# Patient Record
Sex: Male | Born: 1975 | Race: Black or African American | Hispanic: No | Smoking: Never smoker
Health system: Southern US, Community
[De-identification: ages and names within clinical notes are randomized; demographics above are authoritative.]

## PROBLEM LIST (undated history)

## (undated) DIAGNOSIS — F419 Anxiety disorder, unspecified: Secondary | ICD-10-CM

## (undated) DIAGNOSIS — F32A Depression, unspecified: Secondary | ICD-10-CM

## (undated) DIAGNOSIS — B20 Human immunodeficiency virus [HIV] disease: Secondary | ICD-10-CM

## (undated) DIAGNOSIS — R51 Headache: Secondary | ICD-10-CM

## (undated) DIAGNOSIS — F329 Major depressive disorder, single episode, unspecified: Secondary | ICD-10-CM

## (undated) DIAGNOSIS — K621 Rectal polyp: Secondary | ICD-10-CM

## (undated) DIAGNOSIS — R519 Headache, unspecified: Secondary | ICD-10-CM

## (undated) DIAGNOSIS — G4700 Insomnia, unspecified: Secondary | ICD-10-CM

## (undated) DIAGNOSIS — Z21 Asymptomatic human immunodeficiency virus [HIV] infection status: Secondary | ICD-10-CM

## (undated) DIAGNOSIS — K6289 Other specified diseases of anus and rectum: Secondary | ICD-10-CM

## (undated) DIAGNOSIS — Z23 Encounter for immunization: Secondary | ICD-10-CM

## (undated) DIAGNOSIS — A630 Anogenital (venereal) warts: Secondary | ICD-10-CM

## (undated) DIAGNOSIS — Z113 Encounter for screening for infections with a predominantly sexual mode of transmission: Secondary | ICD-10-CM

## (undated) HISTORY — DX: Major depressive disorder, single episode, unspecified: F32.9

## (undated) HISTORY — DX: Encounter for screening for infections with a predominantly sexual mode of transmission: Z11.3

## (undated) HISTORY — DX: Anxiety disorder, unspecified: F41.9

## (undated) HISTORY — DX: Human immunodeficiency virus (HIV) disease: B20

## (undated) HISTORY — DX: Depression, unspecified: F32.A

## (undated) HISTORY — DX: Headache: R51

## (undated) HISTORY — DX: Other specified diseases of anus and rectum: K62.89

## (undated) HISTORY — DX: Anogenital (venereal) warts: A63.0

## (undated) HISTORY — DX: Headache, unspecified: R51.9

## (undated) HISTORY — DX: Insomnia, unspecified: G47.00

## (undated) HISTORY — DX: Asymptomatic human immunodeficiency virus (hiv) infection status: Z21

## (undated) HISTORY — DX: Rectal polyp: K62.1

## (undated) HISTORY — DX: Encounter for immunization: Z23

---

## 1997-06-01 DIAGNOSIS — K621 Rectal polyp: Secondary | ICD-10-CM

## 1997-06-01 HISTORY — DX: Rectal polyp: K62.1

## 2006-02-23 ENCOUNTER — Emergency Department (HOSPITAL_COMMUNITY): Admission: EM | Admit: 2006-02-23 | Discharge: 2006-02-23 | Payer: Self-pay | Admitting: Emergency Medicine

## 2008-06-01 HISTORY — PX: DENTAL SURGERY: SHX609

## 2011-01-15 ENCOUNTER — Telehealth: Payer: Self-pay | Admitting: *Deleted

## 2011-01-15 NOTE — Telephone Encounter (Signed)
He called to verify his new intake appt with Tomasita Morrow, RN. He thought it was this coming Monday or Tuesday. Told him he was not on schedule. Found out from other staff that Tammy was leaving messages for people to make appts but when he did not call her back before she left for vacation, no appt was made. States he is not happy as he has already asked for that day off. Told him I will have Ms. Brooke Dare call him when she returns. [phone]

## 2011-01-29 ENCOUNTER — Ambulatory Visit (INDEPENDENT_AMBULATORY_CARE_PROVIDER_SITE_OTHER): Payer: No Typology Code available for payment source

## 2011-01-29 DIAGNOSIS — Z79899 Other long term (current) drug therapy: Secondary | ICD-10-CM

## 2011-01-29 DIAGNOSIS — Z113 Encounter for screening for infections with a predominantly sexual mode of transmission: Secondary | ICD-10-CM

## 2011-01-29 DIAGNOSIS — B20 Human immunodeficiency virus [HIV] disease: Secondary | ICD-10-CM

## 2011-01-29 LAB — LIPID PANEL
HDL: 54 mg/dL (ref >39)
LDL Cholesterol: 99 mg/dL (ref 0–99)
Triglycerides: 77 mg/dL (ref <150)
VLDL: 15 mg/dL (ref 0–40)

## 2011-01-29 LAB — CBC WITH DIFFERENTIAL/PLATELET
Basophils Relative: 0 % (ref 0–1)
HCT: 45.4 % (ref 39.0–52.0)
Hemoglobin: 16.5 g/dL (ref 13.0–17.0)
Lymphocytes Relative: 49 % — ABNORMAL HIGH (ref 12–46)
MCHC: 36.3 g/dL — ABNORMAL HIGH (ref 30.0–36.0)
Monocytes Absolute: 0.5 10*3/uL (ref 0.1–1.0)
Monocytes Relative: 6 % (ref 3–12)
Neutro Abs: 3.2 10*3/uL (ref 1.7–7.7)
Neutrophils Relative %: 44 % (ref 43–77)
RBC: 5.75 MIL/uL (ref 4.22–5.81)
WBC: 7.4 10*3/uL (ref 4.0–10.5)

## 2011-01-29 NOTE — Progress Notes (Signed)
Pt presents today for initial HIV intake session with Tomasita Morrow, RN IV.  He  became upset and anxious  when he realized he was not seeing the ID physician today.  This is a nurse only visit.  His next visit with the physician will be in two week after the specialty test return.  He has multiple complaints that will not be addressed today.  Pt's major concern  is not his diagnosis of HIV but c/o rectal bleeding for several months accompanied by abdominal pain. He states the rectal bleeding occurs at random and is not associated with rectal intercourse.  Amounts of blood vary from dime size to large quantities and sometimes clots.  Color changes from bright red to dark red at different times throughout the day or week.  He is very anxious and thinks he has colon cancer after submitting information on the internet. He did not seek medical care since he was scheduled for today's visit.   During the intake session he is not able to focus on questions and admits he  thinks I am purposely  ignoring his complaints about his rectal bleeding and only wanted to discuss the HIV. Prior to the start of this session I discussed these symptoms for 15 minutes or more.  I explained to patient he should go to Urgent Care today  for evaluation and he refused. He stated they never take anyone serious and he could be dead before he gets an appointment.  His anxiousness increased. Pt was then encouraged to seek a primary care physician who can address his current symptoms and he would not have to wait  2 weeks for ID appointment. He was also advised he would need a primary care in addition to ID physician.  To assist the patient and put him at ease, I will make the call to schedule a primary care appointment. I called [facility] for appointment.  He is scheduled to see Dr. Illene Regulus on 01-30-11.  New HIV labs done today. Some labs may be complete prior to primary care appointment.   Additional information regarding  HIV available in [facility] intake form.  The only records received from [facility] were  CD 4 , HIV antibody and Western Blot.  Pt. is to return for initial  ID visit with Dr Daiva Eves on 02-09-11.  Tomasita Morrow, RN IV  Infectious Disease Intake Nurse [phone] phone

## 2011-01-30 ENCOUNTER — Ambulatory Visit (INDEPENDENT_AMBULATORY_CARE_PROVIDER_SITE_OTHER): Payer: Self-pay | Admitting: Internal Medicine

## 2011-01-30 ENCOUNTER — Encounter: Payer: Self-pay | Admitting: Internal Medicine

## 2011-01-30 DIAGNOSIS — B20 Human immunodeficiency virus [HIV] disease: Secondary | ICD-10-CM

## 2011-01-30 DIAGNOSIS — K621 Rectal polyp: Secondary | ICD-10-CM

## 2011-01-30 DIAGNOSIS — J370 Chronic laryngitis: Secondary | ICD-10-CM

## 2011-01-30 DIAGNOSIS — Z21 Asymptomatic human immunodeficiency virus [HIV] infection status: Secondary | ICD-10-CM

## 2011-01-30 DIAGNOSIS — K620 Anal polyp: Secondary | ICD-10-CM

## 2011-01-30 DIAGNOSIS — F329 Major depressive disorder, single episode, unspecified: Secondary | ICD-10-CM

## 2011-01-30 LAB — COMPLETE METABOLIC PANEL WITH GFR
ALT: 24 U/L (ref 0–53)
Albumin: 4.6 g/dL (ref 3.5–5.2)
Alkaline Phosphatase: 77 U/L (ref 39–117)
CO2: 28 mEq/L (ref 19–32)
GFR, Est African American: >60 mL/min (ref >60)
Glucose, Bld: 89 mg/dL (ref 70–99)
Potassium: 4.1 mEq/L (ref 3.5–5.3)
Sodium: 141 mEq/L (ref 135–145)
Total Bilirubin: 0.7 mg/dL (ref 0.3–1.2)
Total Protein: 7.3 g/dL (ref 6.0–8.3)

## 2011-01-30 LAB — HEPATITIS C ANTIBODY: HCV Ab: NEGATIVE

## 2011-01-30 LAB — URINALYSIS, ROUTINE W REFLEX MICROSCOPIC
Hgb urine dipstick: NEGATIVE
Ketones, ur: NEGATIVE mg/dL
Nitrite: NEGATIVE
pH: 7 (ref 5.0–8.0)

## 2011-01-30 LAB — HEPATITIS B SURFACE ANTIGEN: Hepatitis B Surface Ag: NEGATIVE

## 2011-01-30 LAB — HEPATITIS A ANTIBODY, IGM: Hep A IgM: NEGATIVE

## 2011-01-30 LAB — RPR

## 2011-01-30 MED ORDER — CLOTRIMAZOLE-BETAMETHASONE 1-0.05 % EX CREA: TOPICAL_CREAM | CUTANEOUS | Status: DC

## 2011-01-30 NOTE — Progress Notes (Signed)
Subjective:    Patient ID: Kenneth Mason, male    DOB: Mar 04, 1976, 35 y.o.   MRN: [id number]  HPI Kenneth Mason presents to establish for continuity care.  He reports that he has had hematochezia and has found blood in the stool. He also is having increased lymph nodes in the submandibular region. He has also developed a skin rash on the abdomen. He has been diagnosed with HIV but has a stable CD4 count. He has been seen by the intake nurse at the [facility] ID clinic (who referred him for primary care) and is scheduled to see Dr. Daiva Mason in September. 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 He would like to have ENT evaluation.  Past Medical History  Diagnosis Date  . Depression     no hospitalization, has seen psychiatrist, not taking medication  . Headache in front of head     q 2 weeks. No n/v, no photophobia. Responds to NSAIDs  . HIV infection     dx'd HIV July 12; CD4 1100  . Rectal polyp 1999    polyp excised   Past Surgical History  Procedure Date  . Wisdom teeth extracted 2010   Family History  Problem Relation Age of Onset  . Hypertension Mother   . Cancer Maternal Grandfather   . Cancer Paternal Grandmother     liver  . Stroke Paternal Grandmother   . Heart disease Paternal Grandfather     CAD/MI-fatal   History   Social History  . Marital Status: Married    Spouse Name: N/A    Number of Children: 2  . Years of Education: 14   Occupational History  . customer service    Social History Main Topics  . Smoking status: Never Smoker   . Smokeless tobacco: Never Used  . Alcohol Use: Not on file     rarely - once a month   . Drug Use: No  . Sexually Active: Yes -- Male, Male partner(s)   Other Topics Concern  . Not on file   Social History Narrative   HSG, 2 yrs college. Married '04 - seperated (2012). !son ' 2005; 1  dtr - '08. 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  Lives with children. Wife/ mother is abroad (aug '12).       Review of Systems Review of Systems v Constitutional:  Negative for fever, chills, activity change and unexpected weight change.  HEENT:  Negative for hearing loss, ear pain, congestion, neck stiffness and postnasal drip. Negative for sore throat or swallowing problems. Negative for dental complaints.   Eyes: Negative for vision loss or change in visual acuity.  Respiratory: Negative for chest tightness and wheezing.   Cardiovascular: Negative for chest pain and palpitation. No decreased exercise tolerance Gastrointestinal: No change in bowel habit. Positive for bloating andgas. NO reflux or indigestion. Positive for change in stool caliber and also with blood in the stool.  Genitourinary: Negative for urgency, frequency, flank pain and difficulty urinating.  Musculoskeletal: Negative for myalgias, back pain, arthralgias and gait problem.  Neurological: Negative for dizziness, tremors, weakness and headaches. Legs will "fall asleep" frequently but this is treansient.   Hematological: Negative for adenopathy.  Psychiatric/Behavioral: Negative for behavioral problems and dysphoric mood.       Objective:   Physical Exam Vitals reviewed and normal HEENT - [place]/AT, C&S clear, PERRLA,  EOMI Neck - supple Node - on bimanual palpation of the floor of the mouth there is a .5 cm firm, non-tender nodule/node. No anterior or posterior adenopathy, no supraclavicular adenopathy. Chest - good breath sounds w/o rales, wheeze Cor - 2+ radial pulse, RRR w/o murmur, rub or gallop. Abd- BS+ , no guarding, no HSM Ext - no deformity Neuro - A&O x 3, CXN II- XII grossly intact, nl gait. Derm- macular tan rash on abdomen.       Assessment & Plan:  Minor area on arms that appear similar to tinea corporis  Plan - lotrisone applied to the isolated well circumscribed lesions bid.

## 2011-02-02 DIAGNOSIS — B20 Human immunodeficiency virus [HIV] disease: Secondary | ICD-10-CM | POA: Insufficient documentation

## 2011-02-02 DIAGNOSIS — K6289 Other specified diseases of anus and rectum: Secondary | ICD-10-CM | POA: Insufficient documentation

## 2011-02-02 DIAGNOSIS — J370 Chronic laryngitis: Secondary | ICD-10-CM | POA: Insufficient documentation

## 2011-02-02 DIAGNOSIS — F329 Major depressive disorder, single episode, unspecified: Secondary | ICD-10-CM | POA: Insufficient documentation

## 2011-02-02 NOTE — Assessment & Plan Note (Signed)
New diagnosis. He seems to have an understanding of this infection. He does not appear to be overly anxious but his history of depression is kept in mind. He is aware and does practice safe sex.  Plan - co-management with the ID team at the [facility] ID clinic

## 2011-02-02 NOTE — Assessment & Plan Note (Signed)
Normal phonation today. His concern is for any structual issues, i.e. Nodules on the vocal chords.  Plan - will refer to ENT after other problems are more settled.

## 2011-02-02 NOTE — Assessment & Plan Note (Signed)
-   Appears stable at this time ?

## 2011-02-02 NOTE — Assessment & Plan Note (Signed)
Patient with a h/o colon polyp now with blood in the stool, not on the stool and no report of hemorrhoidal type symptoms.  Plan - refer to GI for consultation and probable colonoscopy.

## 2011-02-04 ENCOUNTER — Encounter: Payer: Self-pay | Admitting: Internal Medicine

## 2011-02-09 ENCOUNTER — Ambulatory Visit (INDEPENDENT_AMBULATORY_CARE_PROVIDER_SITE_OTHER): Payer: 59 | Admitting: Infectious Disease

## 2011-02-09 ENCOUNTER — Encounter: Payer: Self-pay | Admitting: Infectious Disease

## 2011-02-09 DIAGNOSIS — F329 Major depressive disorder, single episode, unspecified: Secondary | ICD-10-CM

## 2011-02-09 DIAGNOSIS — B20 Human immunodeficiency virus [HIV] disease: Secondary | ICD-10-CM

## 2011-02-09 DIAGNOSIS — J370 Chronic laryngitis: Secondary | ICD-10-CM

## 2011-02-09 DIAGNOSIS — K625 Hemorrhage of anus and rectum: Secondary | ICD-10-CM | POA: Insufficient documentation

## 2011-02-09 DIAGNOSIS — Z21 Asymptomatic human immunodeficiency virus [HIV] infection status: Secondary | ICD-10-CM

## 2011-02-09 NOTE — Assessment & Plan Note (Signed)
In many ways would be ideal candidate for START study with low viral load, high cd4 count would be unlikely to rapidly progress if in the deferred earn and he has doubts about starting ARV, while it would be completely appropriate to start ARV. I think he would be highly compliant if he is ready to start and engaged. He would likely do well on  Single tablet regimen such as atripla, complera or stribild. His problems with depression might make me cautious with atripla though

## 2011-02-09 NOTE — Progress Notes (Signed)
Subjective:    Patient ID: Kenneth Mason, male    DOB: 13-Oct-1975, 35 y.o.   MRN: [id number]  HPI  35 year old [place] American male with newly diagnosed HIV.  We spent well over an hour with the patient including face to face counseling of the patient.  With regards to his HIV he tested positive for HIV via a home kit after onset of cervical lymphadenopathy, fevers, chills malaise, sinus congestion, ear fullness and malaise. HIs CD4 count is greater than 1000 and his viral load on testing is 4k, genotype pending. I reviewed with him current DHHS guidelines which recommend that ALL patients with HIV be treated regardless of CDD4 count (if they have  Detectable viral load) but went through the levels of evidence for need for treatment at each cd4 cuttoff, <200, <350, >350, >500. I explained all first line DHHS regimens to him as well as new Single tablet regimens of complera and stribild. I outlined several of our clinical trials in including the NIH  START trial which specifically addressees starting ARV in pts with CD4>500 as well as our ACTG trials. He met with Deirdre Evener our research RN as well. Currently he is not yet ready to start ARV though he is interetsed in our ACTG and START and he would be ideal candidate for the latter. I reviewed all strengths and weakensss of each ARV regimen I would pick for him   He has several other issues as well.  First he has problems with depression and fleeting problems of passive suicidal ideation. He denies active SI, or HI and is contracted for safety.   He has had blood per rectum but with stable hemoglobin and is being worked up by Dr Debby Bud from [facility] who is considering referall for possible colonoscopy.  Pt is without dizziness, light headendness.  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  HENT: Positive for congestion, voice change and sinus pressure. Negative for sore throat, rhinorrhea, sneezing and trouble swallowing.   Eyes: Negative for photophobia and visual disturbance.  Respiratory: Negative for cough, chest tightness, shortness of breath, wheezing and stridor.   Cardiovascular: Negative for chest pain, palpitations and leg swelling.  Gastrointestinal: Positive for constipation, blood in stool, abdominal distention and anal bleeding. Negative for nausea, vomiting, abdominal pain and diarrhea.  Genitourinary: Negative for dysuria, hematuria, flank pain and difficulty urinating.  Musculoskeletal: Negative for myalgias, back pain, joint swelling, arthralgias and gait problem.  Skin: Negative for color change, pallor, rash and wound.  Neurological: Negative for dizziness, tremors, weakness and light-headedness.  Hematological: Negative for adenopathy. Does not bruise/bleed easily.  Psychiatric/Behavioral: Positive for suicidal ideas, sleep disturbance and dysphoric mood. Negative for behavioral problems, confusion, decreased concentration and agitation. The patient is nervous/anxious.        Objective:   Physical Exam  Constitutional: He is oriented to person, place, and time. He appears well-developed and well-nourished. No distress.  HENT:  Head: Normocephalic and atraumatic.  Mouth/Throat: Oropharynx is clear and moist. No oropharyngeal exudate.  Eyes: Conjunctivae and EOM are normal. Pupils are equal, round, and reactive to light. No scleral icterus.  Neck: Normal range of motion. Neck supple. No JVD present.  Cardiovascular: Normal rate, regular rhythm and normal heart sounds.  Exam reveals no gallop  and no friction rub.   No murmur heard. Pulmonary/Chest: Effort normal and breath sounds normal. No respiratory distress. He has no wheezes. He has no rales. He exhibits no tenderness.  Abdominal:  He exhibits no distension and no mass. There is no tenderness. There is no rebound and no guarding.  Musculoskeletal: He exhibits no edema and no tenderness.  Lymphadenopathy:    He has no cervical adenopathy.  Neurological: He is alert and oriented to person, place, and time. He has normal reflexes. He exhibits normal muscle tone. Coordination normal.  Skin: Skin is warm and dry. He is not diaphoretic. No erythema. No pallor.  Psychiatric: Judgment and thought content normal. His mood appears anxious. His affect is not blunt, not labile and not inappropriate. His speech is not rapid and/or pressured, not delayed, not tangential and not slurred. He is agitated. He is not aggressive, is not hyperactive, not slowed, not withdrawn, not actively hallucinating and not combative. Thought content is not paranoid and not delusional. Cognition and memory are not impaired. He does not express impulsivity or inappropriate judgment. He exhibits a depressed mood. He expresses no homicidal and no suicidal ideation. He expresses no suicidal plans and no homicidal plans. He is communicative. He exhibits normal recent memory and normal remote memory. He is attentive.          Assessment & Plan:  HIV infection In many ways would be ideal candidate for START study with low viral load, high cd4 count would be unlikely to rapidly progress if in the deferred earn and he has doubts about starting ARV, while it would be completely appropriate to start ARV. I think he would be highly compliant if he is ready to start and engaged. He would likely do well on  Single tablet regimen such as atripla, complera or stribild. His problems with depression might make me cautious with atripla though  Laryngitis, chronic Nothing obvious on exam. Could this partly due to GERD? Could trial a PPI  Depression He endorses passive SI at times but never active SI, he is contracted for safety. Would likely benefit from SSRI  Blood per  rectum Likely due to internal hemorrhoids, but agree with working this up

## 2011-02-09 NOTE — Assessment & Plan Note (Signed)
Likely due to internal hemorrhoids, but agree with working this up

## 2011-02-09 NOTE — Assessment & Plan Note (Signed)
He endorses passive SI at times but never active SI, he is contracted for safety. Would likely benefit from SSRI

## 2011-02-09 NOTE — Patient Instructions (Signed)
Research the options we discussed and talk to Kenneth Mason Please make a followup appt in one months time

## 2011-02-09 NOTE — Assessment & Plan Note (Signed)
Nothing obvious on exam. Could this partly due to GERD? Could trial a PPI

## 2011-02-19 LAB — HIV-1 GENOTYPR PLUS

## 2011-02-23 ENCOUNTER — Ambulatory Visit: Payer: 59 | Admitting: Infectious Disease

## 2011-02-26 ENCOUNTER — Other Ambulatory Visit: Payer: Self-pay | Admitting: Internal Medicine

## 2011-02-26 ENCOUNTER — Other Ambulatory Visit: Payer: 59

## 2011-02-26 ENCOUNTER — Ambulatory Visit: Payer: Self-pay | Admitting: Internal Medicine

## 2011-02-26 ENCOUNTER — Ambulatory Visit: Payer: 59 | Admitting: Infectious Disease

## 2011-02-26 DIAGNOSIS — Z0000 Encounter for general adult medical examination without abnormal findings: Secondary | ICD-10-CM

## 2011-02-26 LAB — HEMOCCULT SLIDES (X 3 CARDS)
OCCULT 1: NEGATIVE
OCCULT 2: NEGATIVE
OCCULT 5: NEGATIVE

## 2011-02-27 ENCOUNTER — Telehealth: Payer: Self-pay | Admitting: Internal Medicine

## 2011-02-27 NOTE — Telephone Encounter (Signed)
lmom for pt to call back. R/S pt for 03/02/11 at 4pm.

## 2011-02-27 NOTE — Telephone Encounter (Signed)
Please call pt- hemocult cards negative

## 2011-02-27 NOTE — Telephone Encounter (Signed)
I could not hear pt d/t the connection and reception of his cell. We exchanged calls with VM and I left a message confirming his appt with Dr Rhea Belton on Monday, March 02, 2011 at 4pm. I asked him to call for questions or if he can't make the appt.

## 2011-03-02 ENCOUNTER — Encounter: Payer: Self-pay | Admitting: Internal Medicine

## 2011-03-02 ENCOUNTER — Ambulatory Visit (INDEPENDENT_AMBULATORY_CARE_PROVIDER_SITE_OTHER): Payer: 59 | Admitting: Internal Medicine

## 2011-03-02 DIAGNOSIS — K625 Hemorrhage of anus and rectum: Secondary | ICD-10-CM

## 2011-03-02 DIAGNOSIS — K6289 Other specified diseases of anus and rectum: Secondary | ICD-10-CM

## 2011-03-02 MED ORDER — PEG-KCL-NACL-NASULF-NA ASC-C 100 G PO SOLR: 1.0000 | ORAL | Status: DC

## 2011-03-02 NOTE — Patient Instructions (Signed)
You have been scheduled for a Colonoscopy, instructions have been provided. Your prep has been sent to your pharmacy. 

## 2011-03-02 NOTE — Telephone Encounter (Signed)
Informed pt .

## 2011-03-02 NOTE — Progress Notes (Signed)
Subjective:    Patient ID: Kenneth Mason, male    DOB: 1975-08-27, 35 y.o.   MRN: [id number]  HPI Kenneth Mason is a 35 year old male with a past medical history of HIV with normal CD4 count not currently on ARV therapy, depression, and headaches who is seen in consultation at the request of Dr. Roderic Ovens for evaluation of rectal bleeding.  The patient reports that he has a history of rectal bleeding over the last 3-4 months however over the last 1-2 months he sees blood in "almost every stool". He describes the blood as bright red and occasionally dark red. He has associated abdominal bloating but he denies abdominal pain. He reports caliber change in his stool and notes that over the last year his stools have been thinner. He does occasionally report having to manually disimpact stool using his finger. He is able, however to have spontaneous BM on most days.  At present he denies upper symptoms including no nausea, vomiting, heartburn, dysphagia or odynophagia. His appetite is good and his weight is stable. He denies tenesmus, fecal urgency and fecal seepage.    He denies fevers or chills. He does have headaches which been ongoing since his HIV diagnosis. He notes some mild hoarseness to his voice, which he thinks may relate to his singing. He also reports overall fatigue.  The patient's history suggest prior rectal polyp, however he reports that he had a perianal skin lesion removed around 1999 in [place]. He remembers this being painful but does not recall the pathology results.  Review of Systems ROS Constitutional: See HPI HEENT: Negative for sore throat, mouth sores and trouble swallowing. Eyes: Negative for visual disturbance Respiratory: Negative for cough, chest tightness and shortness of breath Cardiovascular: Negative for chest pain, palpitations and lower extremity swelling Gastrointestinal: See history of present illness Genitourinary: Negative for dysuria and  hematuria. Musculoskeletal: Negative for back pain, arthralgias and myalgias Skin: Negative for rash or color change Neurological: Positive for headaches, negative for weakness, numbness Hematological: Positive for adenopathy, negative for easy bruising/bleeding Psychiatric/behavioral: Positive for depressed mood and trouble sleeping, negative for anxiety   Past Medical History  Diagnosis Date  . Depression     no hospitalization, has seen psychiatrist, not taking medication  . Headache in front of head     q 2 weeks. No n/v, no photophobia. Responds to NSAIDs  . HIV infection     dx'd HIV July 12; CD4 1100  . Rectal polyp 1999    polyp excised   Meds: None  No Known Allergies  Family History  Problem Relation Age of Onset  . Hypertension Mother   . Cancer Maternal Grandfather     unsure of type   . Liver cancer Paternal Grandmother   . Stroke Paternal Grandmother   . Heart disease Paternal Grandfather     CAD/MI-fatal  . Colon cancer Neg Hx     Social History  . Marital Status: Married    Number of Children: 2  . Years of Education: 14   Occupational History  . Customer Service     Social History Main Topics  . Smoking status: Never Smoker   . Smokeless tobacco: Never Used  . Alcohol Use: No     rarely - once a month   . Drug Use: No  . Sexually Active: Yes -- Male, Male partner(s)   Social History Narrative   HSG, 2 yrs college. Married '04 - seperated (2012). !son ' 2005; 1  dtr - '  08. 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  Lives with children. Wife/ mother is abroad (aug '12).       Objective:   Physical Exam BP 124/72  Pulse 88  Ht 5\' 10"  (1.778 m)  Wt 161 lb (73.029 kg)  BMI 23.10 kg/m2 Constitutional: Well-developed and well-nourished. No distress. HEENT: Normocephalic and atraumatic. Oropharynx is clear and moist. No oropharyngeal exudate. Conjunctivae are  normal. Pupils are equal round and reactive to light. No scleral icterus. Neck: Neck supple. Trachea midline. Cardiovascular: Normal rate, regular rhythm and intact distal pulses. No M/R/G Pulmonary/chest: Effort normal and breath sounds normal. No wheezing, rales or rhonchi. Abdominal: Soft, nontender, nondistended. Bowel sounds active throughout. There are no masses palpable. No hepatosplenomegaly. Lymphadenopathy: No cervical adenopathy noted, small shotty submental node noted. Neurological: Alert and oriented to person place and time. Skin: Skin is warm and dry. No rashes noted. Psychiatric: Normal mood and affect. Behavior is normal.  CBC    Component Value Date/Time   WBC 7.4 01/29/2011 1631   RBC 5.75 01/29/2011 1631   HGB 16.5 01/29/2011 1631   HCT 45.4 01/29/2011 1631   PLT 210 01/29/2011 1631   MCV 79.0 01/29/2011 1631   MCH 28.7 01/29/2011 1631   MCHC 36.3* 01/29/2011 1631   RDW 14.7 01/29/2011 1631   LYMPHSABS 3.7 01/29/2011 1631   MONOABS 0.5 01/29/2011 1631   EOSABS 0.1 01/29/2011 1631   BASOSABS 0.0 01/29/2011 1631    CMP     Component Value Date/Time   NA 141 01/29/2011 1631   K 4.1 01/29/2011 1631   CL 102 01/29/2011 1631   CO2 28 01/29/2011 1631   GLUCOSE 89 01/29/2011 1631   BUN 10 01/29/2011 1631   CREATININE 1.17 01/29/2011 1631   CALCIUM 9.8 01/29/2011 1631   PROT 7.3 01/29/2011 1631   ALBUMIN 4.6 01/29/2011 1631   AST 27 01/29/2011 1631   ALT 24 01/29/2011 1631   ALKPHOS 77 01/29/2011 1631   BILITOT 0.7 01/29/2011 1631   FOBT neg x 5     Assessment & Plan:  35 year old male with a past medical history of HIV with normal CD4 count not currently on ARV therapy, depression, and headaches who is seen in consultation at the request of Dr. Roderic Ovens for evaluation of rectal bleeding  1. Rectal bleeding -- the patient reports ongoing trouble with rectal bleeding and associated change in stool caliber.  A recent CBC did not reveal anemia and interestingly enough his FOBT stool  testing was negative.  Given his report of rectal bleeding and change in stool caliber we will proceed with colonoscopy for further evaluation. Further recommendations can be made after this test. We discussed the risk and benefits of colonoscopy today and he is willing to proceed.  2. HIV -- CD4 count most recently normal the patient is not on antiretroviral therapy at present.  He has followup with ID tomorrow to discuss his HIV.

## 2011-03-03 ENCOUNTER — Encounter: Payer: Self-pay | Admitting: Infectious Disease

## 2011-03-03 ENCOUNTER — Ambulatory Visit (INDEPENDENT_AMBULATORY_CARE_PROVIDER_SITE_OTHER): Payer: 59 | Admitting: Infectious Disease

## 2011-03-03 DIAGNOSIS — F329 Major depressive disorder, single episode, unspecified: Secondary | ICD-10-CM

## 2011-03-03 DIAGNOSIS — B20 Human immunodeficiency virus [HIV] disease: Secondary | ICD-10-CM

## 2011-03-03 DIAGNOSIS — K625 Hemorrhage of anus and rectum: Secondary | ICD-10-CM

## 2011-03-03 DIAGNOSIS — Z21 Asymptomatic human immunodeficiency virus [HIV] infection status: Secondary | ICD-10-CM

## 2011-03-03 DIAGNOSIS — G44229 Chronic tension-type headache, not intractable: Secondary | ICD-10-CM

## 2011-03-03 MED ORDER — CLONAZEPAM 0.5 MG PO TABS: 0.5000 mg | ORAL_TABLET | Freq: Every evening | ORAL | Status: DC | PRN

## 2011-03-03 MED ORDER — CITALOPRAM HYDROBROMIDE 20 MG PO TABS: 20.0000 mg | ORAL_TABLET | Freq: Every day | ORAL | Status: DC

## 2011-03-03 NOTE — Assessment & Plan Note (Signed)
Start celexa and klonopin prn qhs. Refer to counselor here with [facility]

## 2011-03-03 NOTE — Progress Notes (Signed)
Subjective:    Patient ID: Kenneth Mason, male    DOB: 1976-05-01, 35 y.o.   MRN: [id number]  HPI  35 yo with HIV, healthy cd4 count and wild type virus, currently considering START  Trial, vs starting vs delaying therapy.  He came to clinic today for multiple reasons. #1 He was concerned about need to take time ffrom work to have his HIV worked up and treated, his rectal bleeding worked up and treated and his emerging depression.  He has been suffering from headaches that sound as if they are tension headache type headache, without aura, not made worse by loud noise or noxious stimuli. They are relieved by relaxation, removing himself from social situaions and with NSAids. He endorses anxiety and depressive symptoms in particular at work where he locked himself in the bathroom for an hour this past week before his supervisor told him to take th erest of the day off. He endorses anhedonia, insominia and decreased appeitite. I referred him to a counselor here with [facility] and advised that he should start SSRI and anxiolytic We reviewed indications and side effects of each drug considered. He denies suicidal or homidicidal ideation and  is contracted for safety.    We had research team meet with him as well. We again reviewed DHHS guidelines and the rationale behind START trial and arguments for and against starting early therapy. In total we spent greater than 45 minutes with Kenneth Mason including greater than 50% of the time in face to face counselling of the patient and in coordination of his care..   Patient given educational materials re HIV and also given counselling face to face re to his depression  Review of Systems  Constitutional: Positive for activity change, appetite change and fatigue. Negative for fever, chills, diaphoresis and unexpected weight change.  HENT: Negative for congestion, sore throat, rhinorrhea, sneezing, trouble swallowing and sinus pressure.   Eyes: Negative for photophobia  and visual disturbance.  Respiratory: Negative for cough, chest tightness, shortness of breath, wheezing and stridor.   Cardiovascular: Negative for chest pain, palpitations and leg swelling.  Gastrointestinal: Positive for anal bleeding. Negative for nausea, vomiting, abdominal pain, diarrhea, constipation, blood in stool and abdominal distention.  Genitourinary: Negative for dysuria, hematuria, flank pain and difficulty urinating.  Musculoskeletal: Negative for myalgias, back pain, joint swelling, arthralgias and gait problem.  Skin: Negative for color change, pallor, rash and wound.  Neurological: Positive for headaches. Negative for dizziness, tremors, weakness and light-headedness.  Hematological: Negative for adenopathy. Does not bruise/bleed easily.  Psychiatric/Behavioral: Positive for sleep disturbance, dysphoric mood and decreased concentration. Negative for suicidal ideas, behavioral problems, confusion, self-injury and agitation. The patient is nervous/anxious.        Objective:   Physical Exam  Constitutional: He is oriented to person, place, and time. He appears well-developed and well-nourished. No distress.  HENT:  Head: Normocephalic and atraumatic.  Mouth/Throat: Oropharynx is clear and moist. No oropharyngeal exudate.  Eyes: Conjunctivae and EOM are normal. Pupils are equal, round, and reactive to light. No scleral icterus.  Neck: Normal range of motion. Neck supple. No JVD present.  Cardiovascular: Normal rate, regular rhythm and normal heart sounds.  Exam reveals no gallop and no friction rub.   No murmur heard. Pulmonary/Chest: Effort normal and breath sounds normal. No respiratory distress. He has no wheezes. He has no rales. He exhibits no tenderness.  Abdominal: He exhibits no distension and no mass. There is no tenderness. There is no rebound and no  guarding.  Musculoskeletal: He exhibits no edema and no tenderness.  Lymphadenopathy:    He has no cervical  adenopathy.  Neurological: He is alert and oriented to person, place, and time. He has normal reflexes. He exhibits normal muscle tone. Coordination normal.  Skin: Skin is warm and dry. He is not diaphoretic. No erythema. No pallor.  Psychiatric: His behavior is normal. Judgment and thought content normal. His mood appears anxious. His affect is not angry, not blunt, not labile and not inappropriate. His speech is not rapid and/or pressured, not delayed, not tangential and not slurred. He exhibits a depressed mood. He is communicative.          Assessment & Plan:  HIV infection Patient still considering START trial vs initiating therapy vs delaying it  Chronic tension headaches Patient to use NSAIds, could consider anti migraine agents if persists  Depression Start celexa and klonopin prn qhs. Refer to counselor here with [facility]  Blood per rectum He is to undergo colonoscopy by dR. Pyrtle

## 2011-03-03 NOTE — Assessment & Plan Note (Signed)
Patient to use NSAIds, could consider anti migraine agents if persists

## 2011-03-03 NOTE — Assessment & Plan Note (Signed)
He is to undergo colonoscopy by dR. Pyrtle

## 2011-03-03 NOTE — Assessment & Plan Note (Signed)
Patient still considering START trial vs initiating therapy vs delaying it

## 2011-03-06 ENCOUNTER — Ambulatory Visit (AMBULATORY_SURGERY_CENTER): Payer: 59 | Admitting: Internal Medicine

## 2011-03-06 ENCOUNTER — Encounter: Payer: Self-pay | Admitting: Internal Medicine

## 2011-03-06 DIAGNOSIS — K625 Hemorrhage of anus and rectum: Secondary | ICD-10-CM

## 2011-03-06 DIAGNOSIS — K623 Rectal prolapse: Secondary | ICD-10-CM

## 2011-03-06 DIAGNOSIS — D128 Benign neoplasm of rectum: Secondary | ICD-10-CM

## 2011-03-06 DIAGNOSIS — D126 Benign neoplasm of colon, unspecified: Secondary | ICD-10-CM

## 2011-03-06 DIAGNOSIS — K6289 Other specified diseases of anus and rectum: Secondary | ICD-10-CM

## 2011-03-06 MED ORDER — SODIUM CHLORIDE 0.9 % IV SOLN: 500.0000 mL | INTRAVENOUS | Status: DC

## 2011-03-09 ENCOUNTER — Telehealth: Payer: Self-pay | Admitting: *Deleted

## 2011-03-09 NOTE — Telephone Encounter (Signed)
No identifier, no message left. TE

## 2011-03-19 ENCOUNTER — Telehealth: Payer: Self-pay

## 2011-03-19 DIAGNOSIS — K6289 Other specified diseases of anus and rectum: Secondary | ICD-10-CM

## 2011-03-19 NOTE — Telephone Encounter (Signed)
Left message on machine to call back  

## 2011-03-19 NOTE — Telephone Encounter (Signed)
Pt needs to be aware of the appt and instructed, meds need to be reviewed

## 2011-03-20 ENCOUNTER — Telehealth: Payer: Self-pay | Admitting: *Deleted

## 2011-03-20 NOTE — Telephone Encounter (Signed)
Phone unavailable at this time

## 2011-03-20 NOTE — Telephone Encounter (Signed)
Left message on machine to call back  

## 2011-03-20 NOTE — Telephone Encounter (Signed)
Message copied by Florene Glen on Fri Mar 20, 2011  8:27 AM ------      Message from: Beverley Fiedler      Created: Tue Mar 17, 2011  7:25 PM       Aram Beecham      Please let Mr. Linford know the bx results from the rectal mass were negative for cancer or concerning cell changes.  This lesion is deep to the top lining of the colon, and needs to be looked at with EUS.  Wendall Papa will set this up for him and contact him regarding the appt date and time.      The EUS will give [place] a definitive answer and hopefully take care of the issue entirely so he will have no more bleeding.      Thanks

## 2011-03-24 NOTE — Telephone Encounter (Signed)
Left message on machine to call back  

## 2011-03-25 NOTE — Telephone Encounter (Signed)
Numerous calls to pt to instruct for EUS.  Unable to reach pt letter with instructions have been mailed.

## 2011-03-27 NOTE — Telephone Encounter (Signed)
Have been unable to call pt and he answered this am to explain he already has his EUS scheduled for 04/09/11 and 0730am.

## 2011-04-02 ENCOUNTER — Encounter (HOSPITAL_COMMUNITY): Payer: Self-pay | Admitting: Pharmacy Technician

## 2011-04-02 ENCOUNTER — Encounter (HOSPITAL_COMMUNITY): Payer: Self-pay

## 2011-04-09 ENCOUNTER — Ambulatory Visit (HOSPITAL_COMMUNITY)
Admission: RE | Admit: 2011-04-09 | Discharge: 2011-04-09 | Disposition: A | Discharge status: Home / Self Care | Payer: 59 | Source: Ambulatory Visit | Attending: Gastroenterology | Admitting: Gastroenterology

## 2011-04-09 ENCOUNTER — Telehealth: Payer: Self-pay | Admitting: *Deleted

## 2011-04-09 ENCOUNTER — Encounter: Payer: 59 | Admitting: Gastroenterology

## 2011-04-09 ENCOUNTER — Encounter (HOSPITAL_COMMUNITY)
Admission: RE | Disposition: A | Discharge status: Home / Self Care | Payer: Self-pay | Source: Ambulatory Visit | Attending: Gastroenterology

## 2011-04-09 ENCOUNTER — Encounter (HOSPITAL_COMMUNITY): Payer: Self-pay | Admitting: *Deleted

## 2011-04-09 DIAGNOSIS — Z860100 Personal history of colon polyps, unspecified: Secondary | ICD-10-CM | POA: Insufficient documentation

## 2011-04-09 DIAGNOSIS — R933 Abnormal findings on diagnostic imaging of other parts of digestive tract: Secondary | ICD-10-CM

## 2011-04-09 DIAGNOSIS — K6289 Other specified diseases of anus and rectum: Secondary | ICD-10-CM | POA: Insufficient documentation

## 2011-04-09 DIAGNOSIS — R51 Headache: Secondary | ICD-10-CM | POA: Insufficient documentation

## 2011-04-09 DIAGNOSIS — Z21 Asymptomatic human immunodeficiency virus [HIV] infection status: Secondary | ICD-10-CM | POA: Insufficient documentation

## 2011-04-09 HISTORY — PX: FLEXIBLE SIGMOIDOSCOPY: SHX5431

## 2011-04-09 HISTORY — PX: EUS: SHX5427

## 2011-04-09 SURGERY — ULTRASOUND, LOWER GI TRACT, ENDOSCOPIC
Anesthesia: Moderate Sedation

## 2011-04-09 MED ORDER — SODIUM CHLORIDE 0.9 % IV SOLN
Freq: Once | INTRAVENOUS | Status: AC
  Administered 2011-04-09: 20 mL via INTRAVENOUS

## 2011-04-09 MED ORDER — MIDAZOLAM HCL 10 MG/2ML IJ SOLN
INTRAMUSCULAR | Status: DC | PRN
  Administered 2011-04-09 (×2): 2 mg via INTRAVENOUS
  Administered 2011-04-09: 1 mg via INTRAVENOUS
  Administered 2011-04-09: 2 mg via INTRAVENOUS

## 2011-04-09 MED ORDER — SODIUM CHLORIDE 0.9 % IJ SOLN: 20.0000 mL | Freq: Once | INTRAMUSCULAR | Status: DC

## 2011-04-09 MED ORDER — MIDAZOLAM HCL 10 MG/2ML IJ SOLN
INTRAMUSCULAR | Status: AC
  Filled 2011-04-09: qty 4

## 2011-04-09 MED ORDER — SODIUM CHLORIDE BACTERIOSTATIC 0.9 % IJ SOLN
3.0000 mL | Freq: Once | INTRAMUSCULAR | Status: AC
  Administered 2011-04-09: 3 mL via INTRAMUSCULAR

## 2011-04-09 MED ORDER — DIPHENHYDRAMINE HCL 50 MG/ML IJ SOLN
INTRAMUSCULAR | Status: AC
  Filled 2011-04-09: qty 1

## 2011-04-09 MED ORDER — FENTANYL CITRATE 0.05 MG/ML IJ SOLN
INTRAMUSCULAR | Status: AC
  Filled 2011-04-09: qty 4

## 2011-04-09 MED ORDER — FENTANYL CITRATE 0.05 MG/ML IJ SOLN
INTRAMUSCULAR | Status: DC | PRN
  Administered 2011-04-09 (×3): 25 ug via INTRAVENOUS

## 2011-04-09 NOTE — Telephone Encounter (Signed)
Message copied by Florene Glen on Thu Apr 09, 2011  4:10 PM ------      Message from: Beverley Fiedler      Created: Thu Apr 09, 2011 10:28 AM      Regarding: [facility] referral       Aram Beecham      Please place referral to surgery for Mr. Mathey: Indication is rectal submucosal lesion which has been bleeding.  Needs transanal resection.      Thanks      Vonna Kotyk.      ----- Message -----         From: Rob Bunting, MD         Sent: 04/09/2011   9:48 AM           To: Erick Blinks, MD            Just completed EUS.  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  I tried removing it with snare but they kept sliding off.  He should see a surgeon for transanal resection.  Forward that path to me when you hear about it, i'm interested to see what it shows

## 2011-04-09 NOTE — H&P (Signed)
  HPI: This is a man with submucosal mass noted on recent colonoscopy (Dr. Rhea Belton).  Here for lower EUS evaluation    Past Medical History  Diagnosis Date  . Depression     no hospitalization, has seen psychiatrist, not taking medication  . Headache in front of head     q 2 weeks. No n/v, no photophobia. Responds to NSAIDs  . HIV infection     dx'd HIV July 12; CD4 1100  . Rectal polyp 1999    polyp excised  . Anxiety     Past Surgical History  Procedure Date  . Dental surgery 2010    Wisdom Teeth     No current facility-administered medications for this encounter.    Allergies as of 03/19/2011  . (No Known Allergies)    Family History  Problem Relation Age of Onset  . Hypertension Mother   . Cancer Maternal Grandfather     unsure of type   . Liver cancer Paternal Grandmother   . Stroke Paternal Grandmother   . Heart disease Paternal Grandfather     CAD/MI-fatal    History   Social History  . Marital Status: Married    Spouse Name: N/A    Number of Children: 2  . Years of Education: 14   Occupational History  . Customer Service     Social History Main Topics  . Smoking status: Never Smoker   . Smokeless tobacco: Never Used  . Alcohol Use: No     rarely - once a month   . Drug Use: No  . Sexually Active: Yes -- Male, Male partner(s)   Other Topics Concern  . Not on file   Social History Narrative   HSG, 2 yrs college. Married '04 - seperated (2012). !son ' 2005; 1  dtr - '08. 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  Lives with children. Wife/ mother is abroad (aug '12).      Physical Exam: Constitutional: generally well-appearing Psychiatric: alert and oriented x3 Abdomen: soft, nontender, nondistended, no obvious ascites, no peritoneal signs, normal bowel sounds     Assessment and plan: 35 y.o. male with rectal lesion   Plan on EUS now

## 2011-04-09 NOTE — Telephone Encounter (Signed)
Spoke with pt to inform him he is scheduled to see Dr Karie Soda at [facility] on 04/22/11 at 0900 for a 09:30am appt. Pt stated understanding; sent him a reminder.

## 2011-04-10 ENCOUNTER — Encounter (HOSPITAL_COMMUNITY): Payer: Self-pay

## 2011-04-10 ENCOUNTER — Other Ambulatory Visit: Payer: Self-pay | Admitting: Licensed Clinical Social Worker

## 2011-04-22 ENCOUNTER — Ambulatory Visit (INDEPENDENT_AMBULATORY_CARE_PROVIDER_SITE_OTHER): Payer: Self-pay | Admitting: Surgery

## 2011-04-23 ENCOUNTER — Emergency Department (HOSPITAL_COMMUNITY)
Admission: EM | Admit: 2011-04-23 | Discharge: 2011-04-23 | Disposition: A | Discharge status: Home / Self Care | Payer: 59 | Attending: Emergency Medicine | Admitting: Emergency Medicine

## 2011-04-23 ENCOUNTER — Encounter (HOSPITAL_COMMUNITY): Payer: Self-pay | Admitting: Adult Health

## 2011-04-23 DIAGNOSIS — Z21 Asymptomatic human immunodeficiency virus [HIV] infection status: Secondary | ICD-10-CM | POA: Insufficient documentation

## 2011-04-23 DIAGNOSIS — T148XXA Other injury of unspecified body region, initial encounter: Secondary | ICD-10-CM

## 2011-04-23 DIAGNOSIS — M542 Cervicalgia: Secondary | ICD-10-CM | POA: Insufficient documentation

## 2011-04-23 DIAGNOSIS — Y92410 Unspecified street and highway as the place of occurrence of the external cause: Secondary | ICD-10-CM | POA: Insufficient documentation

## 2011-04-23 DIAGNOSIS — M549 Dorsalgia, unspecified: Secondary | ICD-10-CM | POA: Insufficient documentation

## 2011-04-23 MED ORDER — IBUPROFEN 800 MG PO TABS: 800.0000 mg | ORAL_TABLET | Freq: Three times a day (TID) | ORAL | Status: DC

## 2011-04-23 MED ORDER — CYCLOBENZAPRINE HCL 10 MG PO TABS: 10.0000 mg | ORAL_TABLET | Freq: Two times a day (BID) | ORAL | Status: DC | PRN

## 2011-04-23 MED ORDER — HYDROCODONE-ACETAMINOPHEN 5-325 MG PO TABS: 1.0000 | ORAL_TABLET | ORAL | Status: DC | PRN
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
## 2011-04-24 ENCOUNTER — Encounter (HOSPITAL_COMMUNITY): Payer: Self-pay | Admitting: Gastroenterology

## 2011-04-24 NOTE — ED Provider Notes (Signed)
Medical screening examination/treatment/procedure(s) were performed by non-physician practitioner and as supervising physician I was immediately available for consultation/collaboration.   Adlai Sinning E Kiandra Sanguinetti, MD 04/24/11 0704 

## 2011-04-28 ENCOUNTER — Ambulatory Visit (INDEPENDENT_AMBULATORY_CARE_PROVIDER_SITE_OTHER): Payer: 59 | Admitting: Surgery

## 2011-04-28 ENCOUNTER — Encounter (INDEPENDENT_AMBULATORY_CARE_PROVIDER_SITE_OTHER): Payer: Self-pay | Admitting: Surgery

## 2011-04-28 DIAGNOSIS — R599 Enlarged lymph nodes, unspecified: Secondary | ICD-10-CM

## 2011-04-28 DIAGNOSIS — K621 Rectal polyp: Secondary | ICD-10-CM

## 2011-04-28 DIAGNOSIS — K620 Anal polyp: Secondary | ICD-10-CM

## 2011-04-28 DIAGNOSIS — R591 Generalized enlarged lymph nodes: Secondary | ICD-10-CM | POA: Insufficient documentation

## 2011-04-28 DIAGNOSIS — K625 Hemorrhage of anus and rectum: Secondary | ICD-10-CM

## 2011-04-28 MED ORDER — HYDROCODONE-ACETAMINOPHEN 5-325 MG PO TABS: 1.0000 | ORAL_TABLET | Freq: Four times a day (QID) | ORAL | Status: AC | PRN

## 2011-04-28 NOTE — Progress Notes (Addendum)
Subjective:     Patient ID: Kenneth Mason, male   DOB: 1975-06-25, 35 y.o.   MRN: [id number]  HPI  Kenneth Mason  Jul 16, 1975 [id number]  Patient Care Team: Duke Salvia, MD as PCP - General (Internal Medicine) Acey Lav, MD as PCP - Infectious Diseases (Infectious Diseases) Rob Bunting, MD as Consulting Physician (Gastroenterology) Erick Blinks, MD as Consulting Physician (Gastroenterology)  This patient is a 35 y.o.male who presents today for surgical evaluation at the request of Dr. Christella Hartigan.   Patient is a young HIV-positive male who noticed rectal bleeding. He was sent by his primary care physician to gastroenterology. Colonoscopy noted a mass in his mid rectum. Biopsy did not prove any cancer. Endoscopic ultrasound of the mass noted it at the mid-rectum to be not full-thickness. Was unable to be excised. Based on that, the patient was sent by Dr. Christella Hartigan to be to consider partial proctectomy by TEM.  Patient usually has constipation and needs laxatives to have his daily bowel movement. No history of enterocolitis, proctitis, inflammatory bowel disease. No anorectal procedures. His see chart for counts are good. He is followed by Dr. Daiva Eves for his HIV status.  Past Medical History  Diagnosis Date  . Depression     no hospitalization, has seen psychiatrist, not taking medication  . Headache in front of head     q 2 weeks. No n/v, no photophobia. Responds to NSAIDs  . Rectal polyp 1999    polyp excised  . Anxiety   . HIV infection     dx'd HIV July 12; CD4 1100.  Presented with lymphadenopathy    Past Surgical History  Procedure Date  . Dental surgery 2010    Wisdom Teeth   . Eus 04/09/2011    Procedure: LOWER ENDOSCOPIC ULTRASOUND (EUS);  Surgeon: Rob Bunting, MD;  Location: [facility];  Service: Endoscopy;  Laterality: N/A;  . Flexible sigmoidoscopy 04/09/2011    Procedure: FLEXIBLE SIGMOIDOSCOPY;  Surgeon: Rob Bunting, MD;  Location: [facility];   Service: Endoscopy;  Laterality: N/A;    History   Social History  . Marital Status: Married    Spouse Name: N/A    Number of Children: 2  . Years of Education: 14   Occupational History  . Customer Service     Social History Main Topics  . Smoking status: Never Smoker   . Smokeless tobacco: Never Used  . Alcohol Use: Yes     rarely - once a month   . Drug Use: No  . Sexually Active: Yes -- Male, Male partner(s)   Other Topics Concern  . Not on file   Social History Narrative   HSG, 2 yrs college. Married '04 - seperated (2012). !son ' 2005; 1  dtr - '08. 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  Lives with children. Wife/ mother is abroad (aug '12).    Family History  Problem Relation Age of Onset  . Hypertension Mother   . Cancer Maternal Grandfather     unsure of type   . Liver cancer Paternal Grandmother   . Stroke Paternal Grandmother   . Heart disease Paternal Grandfather     CAD/MI-fatal    Current outpatient prescriptions:citalopram (CELEXA) 20 MG tablet, Take 20 mg by mouth daily.  , Disp: , Rfl: ;  clonazePAM (KLONOPIN) 0.5 MG tablet, Take 0.5 mg by mouth at bedtime as needed.  , Disp: , Rfl: ;  cyclobenzaprine (FLEXERIL) 10 MG tablet, Take 1 tablet (10 mg total) by mouth 2 (two) times daily as needed for muscle spasms., Disp: 12 tablet, Rfl: 0 HYDROcodone-acetaminophen (NORCO) 5-325 MG per tablet, Take 1 tablet by mouth every 4 (four) hours as needed for pain., Disp: 6 tablet, Rfl: 0;  ibuprofen (ADVIL,MOTRIN) 800 MG tablet, Take 1 tablet (800 mg total) by mouth 3 (three) times daily., Disp: 21 tablet, Rfl: 0  No Known Allergies  BP 108/82  Pulse 60  Temp(Src) 97.8 F (36.6 C) (Temporal)  Resp 16  Ht 5\' 10"  (1.778 m)  Wt 163 lb 4 oz (74.05 kg)  BMI 23.42 kg/m2     Review of Systems  Constitutional: Negative for fever, chills and diaphoresis.  HENT: Negative for  nosebleeds, sore throat, facial swelling, mouth sores, trouble swallowing and ear discharge.   Eyes: Negative for photophobia, discharge and visual disturbance.  Respiratory: Negative for choking, chest tightness, shortness of breath and stridor.   Cardiovascular: Negative for chest pain and palpitations.       Patient walks 60 minutes for about 3 miles without difficulty.  No exertional chest/neck/shoulder/arm pain.   Gastrointestinal: Positive for blood in stool. Negative for nausea, vomiting, abdominal pain, diarrhea, constipation, abdominal distention, anal bleeding and rectal pain.  Genitourinary: Negative for dysuria, urgency, difficulty urinating and testicular pain.  Musculoskeletal: Negative for myalgias, back pain, arthralgias and gait problem.  Skin: Positive for rash. Negative for color change, pallor and wound.       Occasional Left abd rash  Neurological: Negative for dizziness, speech difficulty, weakness, numbness and headaches.  Hematological: Positive for adenopathy. Does not bruise/bleed easily.  Psychiatric/Behavioral: Negative for suicidal ideas, hallucinations, confusion, sleep disturbance, self-injury and agitation. The patient is nervous/anxious.        Objective:   Physical Exam  Constitutional: He is oriented to person, place, and time. He appears well-developed and well-nourished. No distress.  HENT:  Head: Normocephalic.  Mouth/Throat: Oropharynx is clear and moist. No oropharyngeal exudate.  Eyes: Conjunctivae and EOM are normal. Pupils are equal, round, and reactive to light. No scleral icterus.  Neck: Normal range of motion. Neck supple. No tracheal deviation present.       Med sized sublingual LN, non tender (been there for months per pt)  Cardiovascular: Normal rate, regular rhythm and intact distal pulses.   Pulmonary/Chest: Effort normal and breath sounds normal. No respiratory distress. He has no wheezes. He has no rales. He exhibits no tenderness.        Pectus excavatum - mild  Abdominal: Soft. He exhibits no distension and no mass. There is no tenderness. There is no rebound and no guarding. Hernia confirmed negative in the right inguinal area and confirmed negative in the left inguinal area.  Genitourinary: Penis normal. No penile tenderness.       Perianal skin clean with good hygiene.  No pruritis.  No pilonidal disease.  No fissure.  No abscess/fistula.    Tolerates digital rectal exam.  Anxious/tense but consolable.  Normal sphincter tone.   Hemorrhoidal piles WNL  At tip of finger (~10cm from anal verge) Left anterior rectal mass, smooth and mobile, ~2cm round without wide base   Musculoskeletal: Normal range of motion. He exhibits no tenderness.  Lymphadenopathy:    He has no cervical adenopathy.       Right: No inguinal adenopathy present.       Left: No inguinal adenopathy present.  Neurological: He is alert and oriented to person, place,  and time. No cranial nerve deficit. He exhibits normal muscle tone. Coordination normal.  Skin: Skin is warm and dry. No rash noted. He is not diaphoretic. No erythema. No pallor.  Psychiatric: He has a normal mood and affect. His behavior is normal. Judgment and thought content normal.       Assessment:     Mass in mid-rectum, submucosal 2cm left anterior, 10cm from anal verge.  Not able to be removed endoscopically    Plan:     I think he would benefit from partial proctectomy by TEM. Hopefully, but the pathology is benign or at least not adenocarcinoma. Another option is to consider a laparoscopic low anterior resection of rectosigmoid area, but I would reserve that if it requires more definitive treatment first. Standard excisional biopsy first by TEM and see if that is sufficient.  The anatomy & physiology of the digestive tract was discussed.  The pathophysiology of the rectal pathology was discussed.  Natural history risks without surgery was discussed.   I feel the risks of no  intervention will lead to serious problems that outweigh the operative risks; therefore, I recommended surgery.    Laparoscopic & open abdominal techniques were discussed.  I recommended we start with a partial proctectomy by transanal endoscopic microsurgery (TEM) for excisional biopsy to remove the pathology and hopefully cure and/or control the pathology.  This technique can offer less operative risk and faster post-operative recovery.  Possible need for immediate or later abdominal surgery for further treatment was discussed.   Risks such as bleeding, abscess, reoperation, heart attack, death, and other risks were discussed.   I noted a good likelihood this will help address the problem.  Goals of post-operative recovery were discussed as well.  We will work to minimize complications.  An educational handout was given as well.  Questions were answered.  The patient expresses understanding & wishes to proceed with surgery.

## 2011-04-28 NOTE — Patient Instructions (Addendum)
o TRANSANAL ENDOSCOPIC MICROSURGERY o  o Anatomy of the rectum.   o  o The rectum is the lower part of the colon that resides in the pelvis.  It is the final location of stool before it is evacuated through the anus in the process of defecation.  The rectum is an area where unfortunately polyps or a cancer can develop.  In instances of most pre-cancerous lesions, a person often does not need to have a large resection of the rectum, but have it excised by an endoscope with loop and snares.  Unfortunately some polyps are too large to be safely excised through endoscopy and require surgery.  Classically, this is done through an open incision through a low anterior resection or abdominoperineal resection where part or the entire rectum is removed.  However, sometimes only part of a wall of the rectum needs to be removed.  o Transanal endoscopic microsurgery (TEM) was developed as a means to provide a good regional resection of part of the rectal wall for a pre-cancerous lesion or in resection of cancers in which the patient cannot tolerate an open surgery or has an extremely hostile abdomen that makes the resection very risky.   o Transanal endoscopic microsurgery (TEM) involves the patient to be placed under complete general anesthesia. The patient is usually positioned on their back in stirrups or sometimes on their bottom.  The anus is gently dilated and a metal tube is placed into the rectum.   o  o Through the tube, air is inflated and long instruments are used to help access and cut out the abnormal polyp or tumor.  The long instruments are also used to help sew the hole shut.  The specimen is then sent for pathology.  The procedure itself usually takes a few hours of time.  The patient usually stays overnight.  When they can tolerate a regular diet and have adequate pain control they usually leave in one or two days.   o The advantage of TEM is that as opposed to a long hospital stay, patient recovery  is much faster and are less likely to have bowel or other problems.  Careful pre-operative selection is essential to make sure that the patient is an appropriate candidate for the surgery.  Tumors or cancers that are very large or invasive usually are much more difficult to remove by this technique and are not considered the first option.  Persons who are most appropriate for this surgery are those with large polyps that have not become cancers or early cancers in patients who have high risks with larger surgery.    o Risks to the surgery are inherent but overall the procedure is less stressful and less risky to the patient than a classic partial colon resection.      Managing Pain  Pain after surgery or related to activity is often due to strain/injury to muscle, tendon, nerves and/or incisions.  This pain is usually short-term and will improve in a few months.   Many people find it helpful to do the following things TOGETHER to help speed the process of healing and to get back to regular activity more quickly:  1. Avoid heavy physical activity a.  no lifting greater than 20 pounds b. Do not "push through" the pain.  Listen to your body and avoid positions and maneuvers than reproduce the pain c. Walking is okay as tolerated, but go slowly and stop when getting sore.  d. Remember: If it hurts to  do it, then don't do it! 2. Take Anti-inflammatory medication  a. Take with food/snack around the clock for 1-2 weeks i. This helps the muscle and nerve tissues become less irritable and calm down faster b. Choose ONE of the following over-the-counter medications: i. Naproxen 220mg  tabs (ex. Aleve) 1-2 pills twice a day  ii. Ibuprofen 200mg  tabs (ex. Advil, Motrin) 3-4 pills with every meal and just before bedtime iii. Acetaminophen 500mg  tabs (Tylenol) 1-2 pills with every meal and just before bedtime 3. Use a Heating pad or Ice/Cold Pack a. 4-6 times a day b. May use warm bath/hottub  or  showers 4. Try Gentle Massage and/or Stretching  a. at the area of pain many times a day b. stop if you feel pain - do not overdo it  Try these steps together to help you body heal faster and avoid making things get worse.  Doing just one of these things may not be enough.    If you are not getting better after two weeks or are noticing you are getting worse, contact our office for further advice; we may need to re-evaluate you & see what other things we can do to help.

## 2011-05-04 ENCOUNTER — Encounter: Payer: Self-pay | Admitting: Internal Medicine

## 2011-05-04 ENCOUNTER — Ambulatory Visit (INDEPENDENT_AMBULATORY_CARE_PROVIDER_SITE_OTHER): Payer: 59 | Admitting: Internal Medicine

## 2011-05-04 VITALS — BP 110/72 | HR 68 | Temp 98.3°F | Wt 160.8 lb

## 2011-05-04 DIAGNOSIS — M62838 Other muscle spasm: Secondary | ICD-10-CM

## 2011-05-04 MED ORDER — CYCLOBENZAPRINE HCL 5 MG PO TABS: 10.0000 mg | ORAL_TABLET | Freq: Three times a day (TID) | ORAL | Status: AC | PRN

## 2011-05-04 MED ORDER — DIAZEPAM 5 MG PO TABS: 5.0000 mg | ORAL_TABLET | Freq: Every evening | ORAL | Status: AC | PRN
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
## 2011-05-04 NOTE — Patient Instructions (Signed)
It was good to see you today. Use valium at night and flexeril during the day for muscle spasm pain in your neck - Your prescription(s) have been submitted to your pharmacy. Please take as directed and contact our office if you believe you are having problem(s) with the medication(s). we'll make referral to physical therapy for your neck pain. Our office will contact you regarding appointment(s) once made.

## 2011-05-18 ENCOUNTER — Other Ambulatory Visit: Payer: Self-pay | Admitting: Infectious Disease

## 2011-05-18 DIAGNOSIS — B20 Human immunodeficiency virus [HIV] disease: Secondary | ICD-10-CM

## 2011-05-19 ENCOUNTER — Other Ambulatory Visit (INDEPENDENT_AMBULATORY_CARE_PROVIDER_SITE_OTHER): Payer: 59

## 2011-05-19 ENCOUNTER — Ambulatory Visit (INDEPENDENT_AMBULATORY_CARE_PROVIDER_SITE_OTHER): Payer: 59 | Admitting: *Deleted

## 2011-05-19 DIAGNOSIS — B20 Human immunodeficiency virus [HIV] disease: Secondary | ICD-10-CM

## 2011-05-19 DIAGNOSIS — Z23 Encounter for immunization: Secondary | ICD-10-CM

## 2011-05-19 LAB — COMPREHENSIVE METABOLIC PANEL
AST: 19 U/L (ref 0–37)
Alkaline Phosphatase: 63 U/L (ref 39–117)
BUN: 13 mg/dL (ref 6–23)
Creat: 1.35 mg/dL (ref 0.50–1.35)
Total Bilirubin: 0.7 mg/dL (ref 0.3–1.2)

## 2011-05-19 LAB — CBC WITH DIFFERENTIAL/PLATELET
Basophils Absolute: 0 10*3/uL (ref 0.0–0.1)
Basophils Relative: 0 % (ref 0–1)
Eosinophils Relative: 1 % (ref 0–5)
HCT: 46 % (ref 39.0–52.0)
MCHC: 34.8 g/dL (ref 30.0–36.0)
MCV: 83.3 fL (ref 78.0–100.0)
Monocytes Absolute: 0.5 10*3/uL (ref 0.1–1.0)
Neutro Abs: 3.3 10*3/uL (ref 1.7–7.7)
Platelets: 196 10*3/uL (ref 150–400)
RDW: 14.1 % (ref 11.5–15.5)

## 2011-05-20 LAB — T-HELPER CELL (CD4) - (RCID CLINIC ONLY)
CD4 % Helper T Cell: 27 % — ABNORMAL LOW (ref 33–55)
CD4 T Cell Abs: 990 uL (ref 400–2700)

## 2011-05-29 ENCOUNTER — Encounter (HOSPITAL_COMMUNITY): Payer: Self-pay | Admitting: Pharmacy Technician

## 2011-06-04 ENCOUNTER — Ambulatory Visit (INDEPENDENT_AMBULATORY_CARE_PROVIDER_SITE_OTHER): Payer: 59 | Admitting: Infectious Disease

## 2011-06-04 ENCOUNTER — Encounter (HOSPITAL_COMMUNITY): Payer: Self-pay

## 2011-06-04 ENCOUNTER — Encounter (HOSPITAL_COMMUNITY)
Admission: RE | Admit: 2011-06-04 | Discharge: 2011-06-04 | Disposition: A | Discharge status: Home / Self Care | Payer: 59 | Source: Ambulatory Visit | Attending: Surgery | Admitting: Surgery

## 2011-06-04 ENCOUNTER — Encounter: Payer: Self-pay | Admitting: Infectious Disease

## 2011-06-04 VITALS — BP 127/81 | HR 58 | Temp 98.0°F | Ht 70.0 in | Wt 164.8 lb

## 2011-06-04 DIAGNOSIS — K620 Anal polyp: Secondary | ICD-10-CM

## 2011-06-04 DIAGNOSIS — F329 Major depressive disorder, single episode, unspecified: Secondary | ICD-10-CM

## 2011-06-04 DIAGNOSIS — F3289 Other specified depressive episodes: Secondary | ICD-10-CM

## 2011-06-04 DIAGNOSIS — K621 Rectal polyp: Secondary | ICD-10-CM

## 2011-06-04 DIAGNOSIS — K625 Hemorrhage of anus and rectum: Secondary | ICD-10-CM

## 2011-06-04 DIAGNOSIS — B20 Human immunodeficiency virus [HIV] disease: Secondary | ICD-10-CM

## 2011-06-04 DIAGNOSIS — Z21 Asymptomatic human immunodeficiency virus [HIV] infection status: Secondary | ICD-10-CM

## 2011-06-04 LAB — SURGICAL PCR SCREEN
MRSA, PCR: NEGATIVE
Staphylococcus aureus: POSITIVE — AB

## 2011-06-04 NOTE — Assessment & Plan Note (Signed)
Contracted for safety. On ssri and benzodizepene receiving therapy from [facility] at [facility]

## 2011-06-04 NOTE — Assessment & Plan Note (Signed)
He will continue to consider START vs beginnning therapy via [facility] vs deferring outside of START. Kenneth Mason met with the pt today as well briefly
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
## 2011-06-04 NOTE — Assessment & Plan Note (Signed)
See above

## 2011-06-04 NOTE — Patient Instructions (Addendum)
20 Kenneth Mason  06/04/2011   Your procedure is scheduled on:  1 / 11 /13  Report to [facility] at 5:30  AM.  Call this number if you have problems the morning of surgery: [phone]   Remember:   Do not eat food:After Midnight.  May have clear liquids:until Midnight .  Clear liquids include soda, tea, black coffee, apple or grape juice, broth.  Take these medicines the morning of surgery with A SIP OF WATER: CELEXA / NORCO IF NEEDED   Do not wear jewelry, make-up or nail polish.  Do not wear lotions, powders, or perfumes. You may wear deodorant.  Do not shave 48 hours prior to surgery.  Do not bring valuables to the hospital.  Contacts, dentures or bridgework may not be worn into surgery.  Leave suitcase in the car. After surgery it may be brought to your room.  For patients admitted to the hospital, checkout time is 11:00 AM the day of discharge.   Patients discharged the day of surgery will not be allowed to drive home.  Name and phone number of your driver:   Special Instructions: CHG Shower Use Special Wash: 1/2 bottle night before surgery and 1/2 bottle morning of surgery.   Please read over the following fact sheets that you were given: MRSA Information             DISCONTINUE ASPIRIN AND HERBAL MEDICATION

## 2011-06-04 NOTE — Assessment & Plan Note (Signed)
He has a submucosal lesion that is going to be excised by [facility].

## 2011-06-12 ENCOUNTER — Other Ambulatory Visit (INDEPENDENT_AMBULATORY_CARE_PROVIDER_SITE_OTHER): Payer: Self-pay | Admitting: Surgery

## 2011-06-12 ENCOUNTER — Encounter (HOSPITAL_COMMUNITY): Payer: Self-pay | Admitting: Anesthesiology

## 2011-06-12 ENCOUNTER — Encounter (HOSPITAL_COMMUNITY)
Admission: RE | Disposition: A | Discharge status: Home / Self Care | Payer: Self-pay | Source: Ambulatory Visit | Attending: Surgery

## 2011-06-12 ENCOUNTER — Encounter (HOSPITAL_COMMUNITY): Payer: Self-pay | Admitting: *Deleted

## 2011-06-12 ENCOUNTER — Ambulatory Visit (HOSPITAL_COMMUNITY): Payer: 59 | Admitting: Anesthesiology

## 2011-06-12 ENCOUNTER — Observation Stay (HOSPITAL_COMMUNITY)
Admission: RE | Admit: 2011-06-12 | Discharge: 2011-06-14 | Disposition: A | Discharge status: Home / Self Care | Payer: 59 | Source: Ambulatory Visit | Attending: Surgery | Admitting: Surgery

## 2011-06-12 DIAGNOSIS — K625 Hemorrhage of anus and rectum: Secondary | ICD-10-CM | POA: Insufficient documentation

## 2011-06-12 DIAGNOSIS — K620 Anal polyp: Secondary | ICD-10-CM

## 2011-06-12 DIAGNOSIS — K6389 Other specified diseases of intestine: Secondary | ICD-10-CM | POA: Insufficient documentation

## 2011-06-12 DIAGNOSIS — D126 Benign neoplasm of colon, unspecified: Secondary | ICD-10-CM

## 2011-06-12 DIAGNOSIS — F329 Major depressive disorder, single episode, unspecified: Secondary | ICD-10-CM | POA: Insufficient documentation

## 2011-06-12 DIAGNOSIS — K6289 Other specified diseases of anus and rectum: Principal | ICD-10-CM | POA: Insufficient documentation

## 2011-06-12 DIAGNOSIS — F3289 Other specified depressive episodes: Secondary | ICD-10-CM | POA: Insufficient documentation

## 2011-06-12 DIAGNOSIS — J370 Chronic laryngitis: Secondary | ICD-10-CM | POA: Insufficient documentation

## 2011-06-12 DIAGNOSIS — R21 Rash and other nonspecific skin eruption: Secondary | ICD-10-CM | POA: Insufficient documentation

## 2011-06-12 DIAGNOSIS — K5900 Constipation, unspecified: Secondary | ICD-10-CM | POA: Insufficient documentation

## 2011-06-12 DIAGNOSIS — K621 Rectal polyp: Secondary | ICD-10-CM

## 2011-06-12 DIAGNOSIS — R599 Enlarged lymph nodes, unspecified: Secondary | ICD-10-CM | POA: Insufficient documentation

## 2011-06-12 DIAGNOSIS — B20 Human immunodeficiency virus [HIV] disease: Secondary | ICD-10-CM | POA: Insufficient documentation

## 2011-06-12 DIAGNOSIS — M62838 Other muscle spasm: Secondary | ICD-10-CM

## 2011-06-12 DIAGNOSIS — Z21 Asymptomatic human immunodeficiency virus [HIV] infection status: Secondary | ICD-10-CM | POA: Insufficient documentation

## 2011-06-12 HISTORY — PX: TRANSANAL ENDOSCOPIC MICROSURGERY: SHX5281

## 2011-06-12 LAB — CBC
HCT: 42.2 % (ref 39.0–52.0)
Hemoglobin: 15.3 g/dL (ref 13.0–17.0)
MCV: 80.1 fL (ref 78.0–100.0)
Platelets: 206 10*3/uL (ref 150–400)
RBC: 5.27 MIL/uL (ref 4.22–5.81)
WBC: 12.2 10*3/uL — ABNORMAL HIGH (ref 4.0–10.5)

## 2011-06-12 LAB — CREATININE, SERUM: GFR calc Af Amer: >90 mL/min (ref >90)

## 2011-06-12 SURGERY — MICROSURGERY, ENDOSCOPIC, ANAL APPROACH
Anesthesia: General | Wound class: Clean Contaminated

## 2011-06-12 MED ORDER — MEPERIDINE HCL 50 MG/ML IJ SOLN
6.2500 mg | INTRAMUSCULAR | Status: DC | PRN
  Administered 2011-06-12 (×2): 12.5 mg via INTRAVENOUS

## 2011-06-12 MED ORDER — CITALOPRAM HYDROBROMIDE 20 MG PO TABS
20.0000 mg | ORAL_TABLET | Freq: Every day | ORAL | Status: DC
  Administered 2011-06-13 – 2011-06-14 (×2): 20 mg via ORAL
  Filled 2011-06-12 (×3): qty 1

## 2011-06-12 MED ORDER — DIPHENHYDRAMINE HCL 50 MG/ML IJ SOLN
12.5000 mg | Freq: Four times a day (QID) | INTRAMUSCULAR | Status: DC | PRN
  Administered 2011-06-12: 25 mg via INTRAVENOUS
  Filled 2011-06-12: qty 1

## 2011-06-12 MED ORDER — PROMETHAZINE HCL 25 MG/ML IJ SOLN: 6.2500 mg | INTRAMUSCULAR | Status: DC | PRN

## 2011-06-12 MED ORDER — CLONAZEPAM 0.5 MG PO TABS
0.5000 mg | ORAL_TABLET | Freq: Three times a day (TID) | ORAL | Status: DC | PRN
  Filled 2011-06-12: qty 1

## 2011-06-12 MED ORDER — ROCURONIUM BROMIDE 100 MG/10ML IV SOLN
INTRAVENOUS | Status: DC | PRN
  Administered 2011-06-12: 50 mg via INTRAVENOUS

## 2011-06-12 MED ORDER — BUPIVACAINE-EPINEPHRINE 0.25% -1:200000 IJ SOLN
INTRAMUSCULAR | Status: DC | PRN
  Administered 2011-06-12: 50 mL

## 2011-06-12 MED ORDER — HEPARIN SODIUM (PORCINE) 5000 UNIT/ML IJ SOLN
5000.0000 [IU] | Freq: Three times a day (TID) | INTRAMUSCULAR | Status: DC
  Administered 2011-06-12 – 2011-06-14 (×6): 5000 [IU] via SUBCUTANEOUS
  Filled 2011-06-12 (×12): qty 1

## 2011-06-12 MED ORDER — MAGIC MOUTHWASH
15.0000 mL | Freq: Four times a day (QID) | ORAL | Status: DC | PRN
  Filled 2011-06-12: qty 15

## 2011-06-12 MED ORDER — ACETAMINOPHEN 325 MG PO TABS: 325.0000 mg | ORAL_TABLET | Freq: Four times a day (QID) | ORAL | Status: DC | PRN

## 2011-06-12 MED ORDER — ONDANSETRON HCL 4 MG/2ML IJ SOLN
INTRAMUSCULAR | Status: DC | PRN
  Administered 2011-06-12: 4 mg via INTRAVENOUS

## 2011-06-12 MED ORDER — PROMETHAZINE HCL 25 MG/ML IJ SOLN: 12.5000 mg | Freq: Four times a day (QID) | INTRAMUSCULAR | Status: DC | PRN

## 2011-06-12 MED ORDER — MIDAZOLAM HCL 5 MG/ML IJ SOLN
0.5000 mg | Freq: Once | INTRAMUSCULAR | Status: AC | PRN
  Administered 2011-06-12: 1 mg via INTRAVENOUS

## 2011-06-12 MED ORDER — LACTATED RINGERS IV SOLN
INTRAVENOUS | Status: DC | PRN
  Administered 2011-06-12 (×3): via INTRAVENOUS

## 2011-06-12 MED ORDER — CYCLOBENZAPRINE HCL 10 MG PO TABS
10.0000 mg | ORAL_TABLET | Freq: Three times a day (TID) | ORAL | Status: DC | PRN
Start: 2011-06-12 — End: 2011-06-14
  Filled 2011-06-12: qty 1

## 2011-06-12 MED ORDER — DEXTROSE 5 % IV SOLN
2.0000 g | INTRAVENOUS | Status: AC
  Administered 2011-06-12: 2 g via INTRAVENOUS

## 2011-06-12 MED ORDER — DEXAMETHASONE SODIUM PHOSPHATE 10 MG/ML IJ SOLN
INTRAMUSCULAR | Status: DC | PRN
  Administered 2011-06-12: 10 mg via INTRAVENOUS

## 2011-06-12 MED ORDER — LIDOCAINE HCL (CARDIAC) 20 MG/ML IV SOLN
INTRAVENOUS | Status: DC | PRN
  Administered 2011-06-12: 50 mg via INTRAVENOUS

## 2011-06-12 MED ORDER — HYDROCODONE-ACETAMINOPHEN 5-325 MG PO TABS
1.0000 | ORAL_TABLET | Freq: Four times a day (QID) | ORAL | Status: DC | PRN
  Administered 2011-06-12 – 2011-06-13 (×3): 2 via ORAL
  Filled 2011-06-12 (×3): qty 2

## 2011-06-12 MED ORDER — ONDANSETRON HCL 4 MG/2ML IJ SOLN: 4.0000 mg | Freq: Four times a day (QID) | INTRAMUSCULAR | Status: DC | PRN

## 2011-06-12 MED ORDER — MIDAZOLAM HCL 5 MG/5ML IJ SOLN
INTRAMUSCULAR | Status: DC | PRN
  Administered 2011-06-12: 2 mg via INTRAVENOUS

## 2011-06-12 MED ORDER — LACTATED RINGERS IR SOLN
Status: DC | PRN
  Administered 2011-06-12: 1000 mL

## 2011-06-12 MED ORDER — ALBUTEROL SULFATE (5 MG/ML) 0.5% IN NEBU: 2.5000 mg | INHALATION_SOLUTION | Freq: Four times a day (QID) | RESPIRATORY_TRACT | Status: DC | PRN

## 2011-06-12 MED ORDER — SUCCINYLCHOLINE CHLORIDE 20 MG/ML IJ SOLN
INTRAMUSCULAR | Status: DC | PRN
  Administered 2011-06-12: 100 mg via INTRAVENOUS

## 2011-06-12 MED ORDER — HYDROMORPHONE HCL PF 1 MG/ML IJ SOLN
0.5000 mg | INTRAMUSCULAR | Status: DC | PRN
  Administered 2011-06-13: 1 mg via INTRAVENOUS
  Filled 2011-06-12: qty 1

## 2011-06-12 MED ORDER — ACETAMINOPHEN 10 MG/ML IV SOLN
INTRAVENOUS | Status: DC | PRN
  Administered 2011-06-12: 1000 mg via INTRAVENOUS

## 2011-06-12 MED ORDER — DEXTROSE IN LACTATED RINGERS 5 % IV SOLN
INTRAVENOUS | Status: DC
  Administered 2011-06-12 – 2011-06-14 (×4): via INTRAVENOUS

## 2011-06-12 MED ORDER — PROPOFOL 10 MG/ML IV EMUL
INTRAVENOUS | Status: DC | PRN
  Administered 2011-06-12: 200 mg via INTRAVENOUS

## 2011-06-12 MED ORDER — HYDROCORTISONE ACE-PRAMOXINE 2.5-1 % RE CREA
1.0000 "application " | TOPICAL_CREAM | Freq: Four times a day (QID) | RECTAL | Status: DC | PRN
  Filled 2011-06-12: qty 30

## 2011-06-12 MED ORDER — STERILE WATER FOR IRRIGATION IR SOLN
Status: DC | PRN
  Administered 2011-06-12: 1500 mL

## 2011-06-12 MED ORDER — IBUPROFEN 800 MG PO TABS: 800.0000 mg | ORAL_TABLET | Freq: Four times a day (QID) | ORAL | Status: DC | PRN

## 2011-06-12 MED ORDER — LACTATED RINGERS IV SOLN: INTRAVENOUS | Status: DC

## 2011-06-12 MED ORDER — FENTANYL CITRATE 0.05 MG/ML IJ SOLN
INTRAMUSCULAR | Status: DC | PRN
  Administered 2011-06-12: 100 ug via INTRAVENOUS
  Administered 2011-06-12: 50 ug via INTRAVENOUS
  Administered 2011-06-12: 100 ug via INTRAVENOUS

## 2011-06-12 MED ORDER — FENTANYL CITRATE 0.05 MG/ML IJ SOLN
25.0000 ug | INTRAMUSCULAR | Status: DC | PRN
  Administered 2011-06-12: 50 ug via INTRAVENOUS

## 2011-06-12 MED ORDER — 0.9 % SODIUM CHLORIDE (POUR BTL) OPTIME
TOPICAL | Status: DC | PRN
  Administered 2011-06-12: 1000 mL

## 2011-06-12 MED ORDER — DIAZEPAM 5 MG PO TABS: 5.0000 mg | ORAL_TABLET | Freq: Every evening | ORAL | Status: DC | PRN

## 2011-06-12 SURGICAL SUPPLY — 83 items
BLADE HEX COATED 2.75 (ELECTRODE) IMPLANT
BLADE SURG 15 STRL LF DISP TIS (BLADE) IMPLANT
BLADE SURG 15 STRL SS (BLADE)
BLADE SURG SZ10 CARB STEEL (BLADE) IMPLANT
BRIEF STRETCH FOR OB PAD LRG (UNDERPADS AND DIAPERS) ×2 IMPLANT
CANISTER SUCTION 2500CC (MISCELLANEOUS) ×2 IMPLANT
CATH FOLEY 3WAY 30CC 26FR (CATHETERS) IMPLANT
CATH FOLEY SILVER 30CC 26FR (CATHETERS) IMPLANT
CLIP SUT LAPRA TY ABSORB (SUTURE) IMPLANT
CLOTH BEACON ORANGE TIMEOUT ST (SAFETY) ×2 IMPLANT
COVER MAYO STAND STRL (DRAPES) ×2 IMPLANT
DECANTER SPIKE VIAL GLASS SM (MISCELLANEOUS) ×2 IMPLANT
DEVICE SUT QUICK LOAD TK 5 (STAPLE) IMPLANT
DEVICE SUT TI-KNOT TK 5X26 (MISCELLANEOUS) IMPLANT
DRAPE C-ARM 42X72 X-RAY (DRAPES) IMPLANT
DRAPE CAMERA CLOSED 9X96 (DRAPES) ×2 IMPLANT
DRAPE LAPAROSCOPIC ABDOMINAL (DRAPES) IMPLANT
DRAPE LAPAROTOMY T 102X78X121 (DRAPES) IMPLANT
DRAPE LG THREE QUARTER DISP (DRAPES) IMPLANT
DRAPE WARM FLUID 44X44 (DRAPE) ×2 IMPLANT
DRSG PAD ABDOMINAL 8X10 ST (GAUZE/BANDAGES/DRESSINGS) ×2 IMPLANT
ELECT BLADE 6.5 EXT (BLADE) IMPLANT
ELECT CAUTERY BLADE 6.4 (BLADE) IMPLANT
ELECT REM PT RETURN 9FT ADLT (ELECTROSURGICAL) ×2
ELECTRODE REM PT RTRN 9FT ADLT (ELECTROSURGICAL) ×1 IMPLANT
GAUZE SPONGE 4X4 16PLY XRAY LF (GAUZE/BANDAGES/DRESSINGS) ×2 IMPLANT
GLOVE BIOGEL PI IND STRL 7.0 (GLOVE) ×1 IMPLANT
GLOVE BIOGEL PI INDICATOR 7.0 (GLOVE) ×1
GLOVE ECLIPSE 8.0 STRL XLNG CF (GLOVE) ×4 IMPLANT
GLOVE INDICATOR 8.0 STRL GRN (GLOVE) ×4 IMPLANT
GOWN STRL NON-REIN LRG LVL3 (GOWN DISPOSABLE) ×2 IMPLANT
GOWN STRL REIN XL XLG (GOWN DISPOSABLE) ×4 IMPLANT
HEMOSTAT SURGICEL 4X8 (HEMOSTASIS) IMPLANT
HOOK RETRACTION 12 ELAST STAY (MISCELLANEOUS) IMPLANT
IV LACTATED RINGERS 1000ML (IV SOLUTION) ×2 IMPLANT
KIT BASIN OR (CUSTOM PROCEDURE TRAY) ×2 IMPLANT
LEGGING LITHOTOMY PAIR STRL (DRAPES) ×2 IMPLANT
LUBRICANT JELLY K Y 4OZ (MISCELLANEOUS) ×2 IMPLANT
NDL SAFETY ECLIPSE 18X1.5 (NEEDLE) IMPLANT
NEEDLE HYPO 18GX1.5 SHARP (NEEDLE)
NEEDLE HYPO 22GX1.5 SAFETY (NEEDLE) ×2 IMPLANT
NEEDLE INSUFFLATION 14GA 120MM (NEEDLE) IMPLANT
NS IRRIG 1000ML POUR BTL (IV SOLUTION) ×2 IMPLANT
PACK BASIC VI WITH GOWN DISP (CUSTOM PROCEDURE TRAY) ×2 IMPLANT
PACK GENERAL/GYN (CUSTOM PROCEDURE TRAY) IMPLANT
PENCIL BUTTON HOLSTER BLD 10FT (ELECTRODE) ×2 IMPLANT
RETRACTOR STAY HOOK 5MM (MISCELLANEOUS) IMPLANT
RETRACTOR WILSON SYSTEM (INSTRUMENTS) IMPLANT
SCALPEL HARMONIC ACE (MISCELLANEOUS) ×2 IMPLANT
SCISSORS LAP 5X35 DISP (ENDOMECHANICALS) IMPLANT
SPONGE GAUZE 4X4 12PLY (GAUZE/BANDAGES/DRESSINGS) ×2 IMPLANT
SPONGE HEMORRHOID 8X3CM (HEMOSTASIS) IMPLANT
SPONGE LAP 18X18 X RAY DECT (DISPOSABLE) IMPLANT
SPONGE SURGIFOAM ABS GEL 100 (HEMOSTASIS) IMPLANT
SPONGE SURGIFOAM ABS GEL 12-7 (HEMOSTASIS) IMPLANT
STOPCOCK K 69 2C6206 (IV SETS) ×2 IMPLANT
SUCTION POOLE TIP (SUCTIONS) IMPLANT
SUT CHROMIC 2 0 SH (SUTURE) IMPLANT
SUT CHROMIC 3 0 SH 27 (SUTURE) IMPLANT
SUT PDS AB 2-0 CT2 27 (SUTURE) IMPLANT
SUT PDS AB 3-0 SH 27 (SUTURE) IMPLANT
SUT SILK 2 0 (SUTURE)
SUT SILK 2 0 SH CR/8 (SUTURE) IMPLANT
SUT SILK 2-0 18XBRD TIE 12 (SUTURE) IMPLANT
SUT SILK 3 0 SH 30 (SUTURE) IMPLANT
SUT SILK 3 0 SH CR/8 (SUTURE) IMPLANT
SUT VIC AB 2-0 SH 27 (SUTURE)
SUT VIC AB 2-0 SH 27X BRD (SUTURE) IMPLANT
SUT VIC AB 2-0 UR6 27 (SUTURE) ×20 IMPLANT
SUT VIC AB 3-0 SH 18 (SUTURE) IMPLANT
SUT VIC AB 3-0 SH 27 (SUTURE)
SUT VIC AB 3-0 SH 27XBRD (SUTURE) IMPLANT
SUT VIC AB 4-0 SH 18 (SUTURE) IMPLANT
SYR 20CC LL (SYRINGE) IMPLANT
SYR 30ML LL (SYRINGE) IMPLANT
SYR BULB IRRIGATION 50ML (SYRINGE) IMPLANT
SYRINGE IRR TOOMEY STRL 70CC (SYRINGE) IMPLANT
TAPE CLOTH SURG 4X10 WHT LF (GAUZE/BANDAGES/DRESSINGS) ×2 IMPLANT
TOWEL OR 17X26 10 PK STRL BLUE (TOWEL DISPOSABLE) ×4 IMPLANT
TRAY FOLEY CATH 14FRSI W/METER (CATHETERS) ×2 IMPLANT
TUBING CONNECTING 10 (TUBING) ×2 IMPLANT
YANKAUER SUCT BULB TIP 10FT TU (MISCELLANEOUS) ×2 IMPLANT
YANKAUER SUCT BULB TIP NO VENT (SUCTIONS) IMPLANT
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
## 2011-06-12 NOTE — H&P (Signed)
Patient Care Team:  Duke Salvia, MD as PCP - General (Internal Medicine)  Acey Lav, MD as PCP - Infectious Diseases (Infectious Diseases)  Rob Bunting, MD as Consulting Physician (Gastroenterology)  Erick Blinks, MD as Consulting Physician (Gastroenterology)   This patient is a 36 y.o.male who presents today for surgical evaluation at the request of Dr. Christella Hartigan.  Patient is a young HIV-positive male who noticed rectal bleeding. He was sent by his primary care physician to gastroenterology. Colonoscopy noted a mass in his mid rectum. Biopsy did not prove any cancer. Endoscopic ultrasound of the mass noted it at the mid-rectum to be not full-thickness. Was unable to be excised.   Based on that, the patient was sent by Dr. Christella Hartigan to be to consider partial proctectomy by TEM.  Patient usually has constipation and needs laxatives to have his daily bowel movement. No history of enterocolitis, proctitis, inflammatory bowel disease. No anorectal procedures. His see chart for counts are good. He is followed by Dr. Daiva Eves for his HIV status.     Patient Active Problem List  Diagnoses  . Depression  . HIV infection  . Rectal polyp  . Laryngitis, chronic  . Blood per rectum  . Chronic tension headaches  . Lymphadenopathy, neck (esp sublingual)    Past Medical History  Diagnosis Date  . Headache in front of head     q 2 weeks. No n/v, no photophobia. Responds to NSAIDs  . Rectal polyp 1999    polyp excised  . Anxiety   . HIV infection     dx'd HIV July 12; CD4 1100.  Presented with lymphadenopathy  . Depression     Past Surgical History  Procedure Date  . Dental surgery 2010    Wisdom Teeth   . Eus 04/09/2011    Procedure: LOWER ENDOSCOPIC ULTRASOUND (EUS);  Surgeon: Rob Bunting, MD;  Location: [facility];  Service: Endoscopy;  Laterality: N/A;  . Flexible sigmoidoscopy 04/09/2011    Procedure: FLEXIBLE SIGMOIDOSCOPY;  Surgeon: Rob Bunting, MD;  Location: [facility];  Service: Endoscopy;  Laterality: N/A;    History   Social History  . Marital Status: Married    Spouse Name: N/A    Number of Children: 2  . Years of Education: 14   Occupational History  . Customer Service     Social History Main Topics  . Smoking status: Never Smoker   . Smokeless tobacco: Never Used  . Alcohol Use: Yes     rarely - once a month   . Drug Use: No  . Sexually Active: Yes -- Male, Male partner(s)   Other Topics Concern  . Not on file   Social History Narrative   HSG, 2 yrs college. Married '04 - seperated (2012). !son ' 2005; 1  dtr - '08. 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  Lives with children. Wife/ mother is abroad (aug '12).    Family History  Problem Relation Age of Onset  . Hypertension Mother   . Cancer Maternal Grandfather     unsure of type   . Liver cancer Paternal Grandmother   . Stroke Paternal Grandmother   . Heart disease Paternal Grandfather     CAD/MI-fatal    Current facility-administered medications:cefOXitin (MEFOXIN) 2 g in dextrose 5 % 50 mL IVPB, 2 g, Intravenous, 60 min Pre-Op, Ardeth Sportsman, MD  No Known Allergies  BP 127/85  Pulse 61  Temp(Src) 97.4 F (36.3 C) (Oral)  Resp 18  SpO2 99%     Review of Systems  Constitutional: Negative for fever, chills and diaphoresis.  HENT: Negative for nosebleeds, sore throat, facial swelling, mouth sores, trouble swallowing and ear discharge.  Eyes: Negative for photophobia, discharge and visual disturbance.  Respiratory: Negative for choking, chest tightness, shortness of breath and stridor.  Cardiovascular: Negative for chest pain and palpitations.  Gastrointestinal: Positive for blood in stool. Negative for nausea, vomiting, abdominal pain, diarrhea, constipation, abdominal distention, anal bleeding and rectal pain.  Genitourinary: Negative for dysuria, urgency, difficulty  urinating and testicular pain.  Musculoskeletal: Negative for myalgias, back pain, arthralgias and gait problem.  Skin: Positive for rash. Negative for color change, pallor and wound.  Occasional Left abd rash  Neurological: Negative for dizziness, speech difficulty, weakness, numbness and headaches.  Hematological: Positive for adenopathy. Does not bruise/bleed easily.  Psychiatric/Behavioral: Negative for suicidal ideas, hallucinations, confusion, sleep disturbance, self-injury and agitation. The patient is nervous/anxious.    Objective:   Physical Exam  Constitutional: He is oriented to person, place, and time. He appears well-developed and well-nourished. No distress.  HENT:  Head: Normocephalic.  Mouth/Throat: Oropharynx is clear and moist. No oropharyngeal exudate.  Eyes: Conjunctivae and EOM are normal. Pupils are equal, round, and reactive to light. No scleral icterus.  Neck: Normal range of motion. Neck supple. No tracheal deviation present.  Med sized sublingual LN, non tender (been there for months per pt)  Cardiovascular: Normal rate, regular rhythm and intact distal pulses.  Pulmonary/Chest: Effort normal and breath sounds normal. No respiratory distress. He has no wheezes. He has no rales. He exhibits no tenderness.  Pectus excavatum - mild  Abdominal: Soft. He exhibits no distension and no mass. There is no tenderness. There is no rebound and no guarding. Hernia confirmed negative in the right inguinal area and confirmed negative in the left inguinal area.  Genitourinary: Penis normal. No penile tenderness.   At tip of finger (~10cm from anal verge) Left anterior rectal mass, smooth and mobile, ~2cm round without wide base  Musculoskeletal: Normal range of motion. He exhibits no tenderness.  Lymphadenopathy:  He has no cervical adenopathy.  Right: No inguinal adenopathy present.  Left: No inguinal adenopathy present.  Neurological: He is alert and oriented to person,  place, and time. No cranial nerve deficit. He exhibits normal muscle tone. Coordination normal.  Skin: Skin is warm and dry. No rash noted. He is not diaphoretic. No erythema. No pallor.  Psychiatric: He has a normal mood and affect. His behavior is normal. Judgment and thought content normal.    Assessment:    Mass in mid-rectum, submucosal 2cm left anterior, 10cm from anal verge. Not able to be removed endoscopically   Plan:    I think he would benefit from partial proctectomy by TEM. Hopefully, the pathology will be benign or at least not adenocarcinoma. Another option is to consider a laparoscopic low anterior resection of rectosigmoid area, but I would reserve that if it requires more definitive treatment first. Excisional biopsy first by TEM and see if that is sufficient given his immunosuppressed state  The anatomy & physiology of the digestive tract was discussed. The pathophysiology of the rectal pathology was discussed. Natural history risks without surgery was discussed. I feel the risks of no intervention will lead to serious problems that outweigh the operative risks; therefore, I recommended surgery.   Laparoscopic & open abdominal techniques were discussed. I recommended  we start with a partial proctectomy by transanal endoscopic microsurgery (TEM) for excisional biopsy to remove the pathology and hopefully cure and/or control the pathology. This technique can offer less operative risk and faster post-operative recovery. Possible need for immediate or later abdominal surgery for further treatment was discussed.   Risks such as bleeding, abscess, reoperation, heart attack, death, and other risks were discussed. I noted a good likelihood this will help address the problem. Goals of post-operative recovery were discussed as well. We will work to minimize complications. An educational handout was given as well. Questions were answered. The patient expresses understanding & wishes to  proceed with surgery.  I have re-reviewed the the patient's records, history, medications, and allergies.  I have re-examined the patient.  I again discussed intraoperative plans and goals of post-operative recovery.  The patient agrees to proceed.

## 2011-06-12 NOTE — Anesthesia Procedure Notes (Signed)
Procedure Name: Intubation Date/Time: 06/12/2011 7:37 AM Performed by: [place], [address] C Pre-anesthesia Checklist: Patient identified, Emergency Drugs available, Suction available, Patient being monitored and Timeout performed Patient Re-evaluated:Patient Re-evaluated prior to inductionOxygen Delivery Method: Circle System Utilized Preoxygenation: Pre-oxygenation with 100% oxygen Intubation Type: IV induction Ventilation: Mask ventilation without difficulty Laryngoscope Size: Mac and 3 Grade View: Grade I Tube type: Oral Tube size: 7.5 mm Number of attempts: 1 Airway Equipment and Method: stylet Placement Confirmation: ETT inserted through vocal cords under direct vision,  positive ETCO2 and breath sounds checked- equal and bilateral Secured at: 21 cm Tube secured with: Tape Dental Injury: Teeth and Oropharynx as per pre-operative assessment

## 2011-06-12 NOTE — Anesthesia Postprocedure Evaluation (Signed)
Anesthesia Post Note  Patient: Kenneth Mason  Procedure(s) Performed:  TRANSANAL ENDOSCOPIC MICROSURGERY - partial proctectomy by TEM  Anesthesia type: General  Patient location: PACU  Post pain: Pain level controlled  Post assessment: Post-op Vital signs reviewed  Last Vitals:  Filed Vitals:   06/12/11 1130  BP: 130/74  Pulse: 70  Temp:   Resp: 14    Post vital signs: Reviewed  Level of consciousness: sedated  Complications: No apparent anesthesia complications

## 2011-06-12 NOTE — Transfer of Care (Signed)
Immediate Anesthesia Transfer of Care Note  Patient: Kenneth Mason  Procedure(s) Performed:  TRANSANAL ENDOSCOPIC MICROSURGERY - partial proctectomy by TEM  Patient Location: PACU  Anesthesia Type: General  Level of Consciousness: awake and alert   Airway & Oxygen Therapy: Patient Spontanous Breathing and Patient connected to face mask oxygen  Post-op Assessment: Report given to PACU RN and Post -op Vital signs reviewed and stable  Post vital signs: Reviewed and stable Filed Vitals:   06/12/11 0553  BP: 127/85  Pulse: 61  Temp: 36.3 C  Resp: 18    Complications: No apparent anesthesia complications

## 2011-06-12 NOTE — Brief Op Note (Signed)
06/12/2011  10:07 AM  PATIENT:  Kenneth Mason  36 y.o. male  Patient Care Team: Duke Salvia, MD as PCP - General (Internal Medicine) Acey Lav, MD as PCP - Infectious Diseases (Infectious Diseases) Rob Bunting, MD as Consulting Physician (Gastroenterology)  PRE-OPERATIVE DIAGNOSIS:  rectal polyp   POST-OPERATIVE DIAGNOSIS:  submucosal rectal mass ? cyst  PROCEDURE:  Procedure(s): Partial proctectomy by TRANSANAL ENDOSCOPIC MICROSURGERY  SURGEON:  Surgeon(s): Ardeth Sportsman, MD  PHYSICIAN ASSISTANT:   ASSISTANTS: none   ANESTHESIA:   local and general  EBL:  Total I/O In: 1000 [I.V.:1000] Out: 750 [Urine:750]  BLOOD ADMINISTERED:none  DRAINS: none   LOCAL MEDICATIONS USED:  BUPIVICAINE 30CC  SPECIMEN:  Source of Specimen:  Rectal wall mass  DISPOSITION OF SPECIMEN:  PATHOLOGY  COUNTS:  YES  TOURNIQUET:  * No tourniquets in log *  DICTATION: .Other Dictation: Dictation Number [id number]  PLAN OF CARE: Admit for overnight observation  PATIENT DISPOSITION:  PACU - hemodynamically stable.   Delay start of Pharmacological VTE agent (>24hrs) due to surgical blood loss or risk of bleeding:  NO

## 2011-06-13 DIAGNOSIS — F411 Generalized anxiety disorder: Secondary | ICD-10-CM

## 2011-06-13 LAB — CBC
HCT: 43.6 % (ref 39.0–52.0)
Hemoglobin: 16.1 g/dL (ref 13.0–17.0)
RBC: 5.51 MIL/uL (ref 4.22–5.81)
RDW: 13.5 % (ref 11.5–15.5)
WBC: 14.2 10*3/uL — ABNORMAL HIGH (ref 4.0–10.5)

## 2011-06-13 LAB — BASIC METABOLIC PANEL
BUN: 10 mg/dL (ref 6–23)
CO2: 29 mEq/L (ref 19–32)
Chloride: 100 mEq/L (ref 96–112)
GFR calc Af Amer: 83 mL/min — ABNORMAL LOW (ref >90)
Potassium: 3.4 mEq/L — ABNORMAL LOW (ref 3.5–5.1)

## 2011-06-13 MED ORDER — LACTATED RINGERS IV BOLUS (SEPSIS)
1000.0000 mL | Freq: Once | INTRAVENOUS | Status: AC
  Administered 2011-06-13: 1000 mL via INTRAVENOUS

## 2011-06-13 MED ORDER — FENTANYL BOLUS VIA INFUSION: 20.0000 ug | INTRAVENOUS | Status: DC | PRN

## 2011-06-13 MED ORDER — LACTATED RINGERS IV BOLUS (SEPSIS): 1000.0000 mL | Freq: Three times a day (TID) | INTRAVENOUS | Status: DC | PRN

## 2011-06-13 MED ORDER — CIPROFLOXACIN HCL 500 MG PO TABS
500.0000 mg | ORAL_TABLET | Freq: Two times a day (BID) | ORAL | Status: DC
  Administered 2011-06-13 – 2011-06-14 (×2): 500 mg via ORAL
  Filled 2011-06-13 (×6): qty 1

## 2011-06-13 MED ORDER — METRONIDAZOLE 500 MG PO TABS
500.0000 mg | ORAL_TABLET | Freq: Four times a day (QID) | ORAL | Status: DC
  Administered 2011-06-13 – 2011-06-14 (×5): 500 mg via ORAL
  Filled 2011-06-13 (×14): qty 1

## 2011-06-13 MED ORDER — PHENOL 1.4 % MT LIQD: 2.0000 | OROMUCOSAL | Status: DC | PRN

## 2011-06-13 MED ORDER — TRAMADOL HCL 50 MG PO TABS: 50.0000 mg | ORAL_TABLET | Freq: Four times a day (QID) | ORAL | Status: DC | PRN

## 2011-06-13 MED ORDER — IBUPROFEN 800 MG PO TABS
800.0000 mg | ORAL_TABLET | Freq: Four times a day (QID) | ORAL | Status: DC
Start: 2011-06-13 — End: 2011-06-14
  Administered 2011-06-13 – 2011-06-14 (×5): 800 mg via ORAL
  Filled 2011-06-13 (×13): qty 1

## 2011-06-13 MED ORDER — GUAIFENESIN-DM 100-10 MG/5ML PO SYRP: 15.0000 mL | ORAL_SOLUTION | ORAL | Status: DC | PRN

## 2011-06-13 MED ORDER — PSYLLIUM 95 % PO PACK
1.0000 | PACK | Freq: Two times a day (BID) | ORAL | Status: DC
  Administered 2011-06-13 – 2011-06-14 (×3): 1 via ORAL
  Filled 2011-06-13 (×7): qty 1

## 2011-06-13 MED ORDER — FENTANYL CITRATE 0.05 MG/ML IJ SOLN
25.0000 ug | INTRAMUSCULAR | Status: DC | PRN
Start: 2011-06-13 — End: 2011-06-14

## 2011-06-13 MED ORDER — MENTHOL 3 MG MT LOZG: 1.0000 | LOZENGE | OROMUCOSAL | Status: DC | PRN

## 2011-06-13 MED ORDER — FLORA-Q PO CAPS
1.0000 | ORAL_CAPSULE | Freq: Every day | ORAL | Status: DC
  Administered 2011-06-13 – 2011-06-14 (×2): 1 via ORAL
  Filled 2011-06-13 (×4): qty 1

## 2011-06-13 MED ORDER — ALUM & MAG HYDROXIDE-SIMETH 200-200-20 MG/5ML PO SUSP: 30.0000 mL | Freq: Four times a day (QID) | ORAL | Status: DC | PRN

## 2011-06-13 NOTE — Progress Notes (Signed)
Dr. Michaell Cowing on unit. Discussed not voiding, bladder scan of 150cc, adamantly refusing to be catheterized again.  Received order for LR 1000cc bolus now.  Will continue to monitor output.

## 2011-06-13 NOTE — Progress Notes (Addendum)
Kenneth Mason 11/10/1975 [id number]  PCP: Illene Regulus, MD, MD Outpatient Care Team: Patient Care Team: Duke Salvia, MD as PCP - General (Internal Medicine) Acey Lav, MD as PCP - Infectious Diseases (Infectious Diseases) Rob Bunting, MD as Consulting Physician (Gastroenterology)  Inpatient Treatment Team: Treatment Team: Attending Provider: Ardeth Sportsman, MD; Registered Nurse: Donnie Coffin, RN  Subjective:  Pt sore in abdomen - pills make him fall asleep Afraid to walk "I get jittery" Tol solid food OK but appetite down.  Feels bloated Wife just coming into room  Objective:  Vital signs:  Temp:  [97.3 F (36.3 C)-98.8 F (37.1 C)] 98.5 F (36.9 C) (01/12 1017) Pulse Rate:  [50-85] 50  (01/12 1017) Resp:  [12-18] 16  (01/12 1017) BP: (121-146)/(61-82) 132/82 mmHg (01/12 1017) SpO2:  [95 %-100 %] 96 % (01/12 0545) Weight:  [162 lb (73.483 kg)] 162 lb (73.483 kg) (01/11 1309) Last BM Date:  (unknown)  Intake/Output   Yesterday:  01/11 0701 - 01/12 0700 In: 4760 [P.O.:840; I.V.:3920] Out: 5250 [Urine:5250] This shift:     Bowel function:  Flatus: scant  BM: no  Physical Exam:  General: Pt sleepy but oriented x4 in no acute distress Eyes: PERRL, normal EOM.  Sclera clear.  No icterus Neuro: CN II-XII intact w/o focal sensory/motor deficits. Lymph: No head/neck/groin lymphadenopathy Psych:  No delerium/psychosis/paranoia HENT: Normocephalic, Mucus membranes moist.  No thrush Neck: Supple, No tracheal deviation Chest: Clear.  No chest wall pain w good excursion CV:  Pulses intact.  Regular rhythm Abdomen: Flat, firm.  Mildly tender.  Nondistended.  No incarcerated hernias. Ext:  SCDs BLE.  No mjr edema.  No cyanosis Skin: No petechiae / purpurae  Results:   Labs: Results for orders placed during the hospital encounter of 06/12/11 (from the past 48 hour(s))  CBC     Status: Abnormal   Collection Time   06/12/11  2:49 PM   Component Value Range Comment   WBC 12.2 (*) 4.0 - 10.5 (K/uL)    RBC 5.27  4.22 - 5.81 (MIL/uL)    Hemoglobin 15.3  13.0 - 17.0 (g/dL)    HCT 40.9  81.1 - 91.4 (%)    MCV 80.1  78.0 - 100.0 (fL)    MCH 29.0  26.0 - 34.0 (pg)    MCHC 36.3 (*) 30.0 - 36.0 (g/dL)    RDW 78.2  95.6 - 21.3 (%)    Platelets 206  150 - 400 (K/uL)   CREATININE, SERUM     Status: Abnormal   Collection Time   06/12/11  2:49 PM      Component Value Range Comment   Creatinine, Ser 1.12  0.50 - 1.35 (mg/dL)    GFR calc non Af Amer 84 (*) >90 (mL/min)    GFR calc Af Amer >90  >90 (mL/min)     Imaging / Studies: No results found.  Medications / Allergies: per chart  Antibiotics: Anti-infectives     Start     Dose/Rate Route Frequency Ordered Stop   06/13/11 1215   ciprofloxacin (CIPRO) tablet 500 mg        500 mg Oral 2 times daily 06/13/11 1212     06/13/11 1215   metroNIDAZOLE (FLAGYL) tablet 500 mg        500 mg Oral 4 times per day 06/13/11 1212     06/12/11 0630   cefOXitin (MEFOXIN) 2 g in dextrose 5 % 50 mL IVPB  2 g 100 mL/hr over 30 Minutes Intravenous 60 min pre-op 06/12/11 1610 06/12/11 0739          Assessment  Kenneth Mason  36 y.o. 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  No strong ev thrush  D/w pt & wife  Ardeth Sportsman, M.D., F.A.C.S. Gastrointestinal and Minimally Invasive Surgery [facility] [address] [place], [place]  [address] [phone] Main / Paging [phone] Voice Mail   06/13/2011

## 2011-06-13 NOTE — Progress Notes (Signed)
Bladder scanned. `150cc volume ntd.,. Pt continues to drink fluids. Overall appears to be feeling better this evening. Ambulated around perimeter of hallway x 2.  Will inform Dr. Michaell Cowing of output and continue to monitor.

## 2011-06-13 NOTE — Progress Notes (Signed)
Pt stated "sleepy, drowsy" much of a.m.,. Did not want to get up. C/o abd "hurting".  Not passing gas. Belching occasionally.  Has not voided as of yet. States not uncomfortable and has no urge to urinate.  Drinking extra fluids so he can go (per his statement).  States he absolutely does not want to be catheterized again. At this point adamantly refuses.  Will bladder scan to get baseline of bladder volume.

## 2011-06-13 NOTE — Op Note (Signed)
[name], SCHOENE NO.:  [id number]  MEDICAL RECORD NO.:  [id number]  LOCATION:  1532                         FACILITY:  [facility]  PHYSICIAN:  Kenneth Sportsman, MD     DATE OF BIRTH:  April 12, 1976  DATE OF PROCEDURE:  06/12/2011 DATE OF DISCHARGE:                              OPERATIVE REPORT   PRIMARY CARE PHYSICIAN:  Rosalyn Gess. Norins, MD  GASTROLOGISTS:  Erick Blinks, MD; Rachael Fee, MD; [facility].  INFECTIOUS DISEASE PHYSICIAN:  Acey Lav, MD  SURGEON:  Kenneth Sportsman, MD  ASSISTANT:  RN.  PREOPERATIVE DIAGNOSIS:  Submucosal rectal wall mass, possible carcinoid by endorectal ultrasound.  POSTOPERATIVE DIAGNOSIS:  Submucosal rectal wall mass, possible carcinoid by endorectal ultrasound, question of cyst.  PROCEDURE PERFORMED:  Partial proctectomy by transanal endoscopic microsurgery (TEM).  ANESTHESIA: 1. General anesthesia. 2. Anorectal block.  SPECIMENS:  Rectal wall mass.  I oriented this with Pathology, Dr. Frederica Kuster present.  Yellow pins or proximal blue pin is distal, lavender pins are anterolateral, and light green/blue pin is posterolateral.  DRAINS:  None.  ESTIMATED BLOOD LOSS:  Minimal.  COMPLICATIONS:  None apparent.  INDICATIONS:  Kenneth Mason is a pleasant 37 year old HIV positive male with intermittent rectal bleeding.  He underwent endoscopy by Dr. Erick Blinks in October.  He had a small polyp removed in the rectum, but he was also found to have a submucosal lesion.  Endorectal ultrasound was done by Dr. Rob Bunting, who is concerning for a possible carcinoid. The patient was sent to me for consideration of excision.  Because there is no proven neoplastic etiology, I recommended partial proctectomy for definitive excisional biopsy and possible therapy. Technique by TEM was discussed.  Risks, benefits, and alternatives were discussed.  Possible conversion to open leak and bleeding abscesses, and other risks  were discussed.  Questions answered and the patient agreed to proceed.  OPERATIVE FINDINGS:  He had a spherical submucosal lesion.  There was a pin hole with some milky fluid extracting from it.  It was mobile.  It was in the mid rectum.  It was in the right lateral wall 10 cm from the anal verge.  Orientation as noted above.  There was no evidence of any other obvious rectal abnormalities.  I could see an old scar from the prior snare polypectomy.  DESCRIPTION OF PROCEDURE:  Informed consent was confirmed.  The patient underwent general anesthesia without any difficulty.  He was positioned right-side down lithotomy with careful positioning with his legs out on a split leg and sand bag.  His perineum was prepped and draped in a sterile fashion.  He received IV antibiotics.  He was on sequential pressure devices.  He had a Foley catheter placed.  He was draped in a sterile fashion.  Surgical time-out confirmed our plan.  I confirmed orientation by digital examination prior to positioning and confirmed again after positioning.  I did gentle anal dilation and was able to fit the 4 cm TEM proctoscope in place.  I induced carbon dioxide insufflation into the rectum.  I could easily see the submucosal mass.  I used tip point cautery to mark  1 cm margins around the mass.  I entered through the rectal wall using a tip point cautery.  Entry was full thickness.  I transitioned over to harmonic scalpel.  I freed off the proximal margin.  I came around the posterolateral and anterolateral margins.  I got into the mesorectum and took a healthy swab to mesorectum using the harmonic scalpel, and followed that proximally to the proximal margin.  I made sure I had good margins around the mass.  I completed mucosal transection around the proximal margin and completed our resection at the mesorectum.  In doing this, I did have a small hole into the peritoneum, but the entry was clean.  Hemostasis was  excellent.  I removed the specimen, oriented it.  I had discussed with Dr. [place] Rund with Pathology and the Pathology team.  Pins, etc., as noted above.  Returned and he had good hemostasis.  I mapped out the wound.  It was up near one of the mid rectal folds.  I found the peritoneal breach and closed that primarily using 2-0 Vicryl figure-of-eight interrupted stitches x2 using laparoscopic suturing with 2-0 Vicryl on a UR-6.  I used an extracorporeal knot pusher to help bring those down and some intracorporeal suturing as well.  I reapproximated the rectum using interrupted 2-0 Vicryl stitches, primarily using extracorporeal knot pusher and occasional LaproTIE.  I rechecked and found 2 small gaps, and closed those down.  I reinspected and had good closure with no gaps by a fine tip grasper.  I did irrigate it, ensured hemostasis.  I had a nice patent opening.  Again, it was at one of the more proximal rectal folds.  The closure was at the tip of my finger and I could palpate and then felt a good closure as well.  I evacuated carbon dioxide and removed the ports.  The patient is being extubated to go to the recovery room.  I discussed postop findings with the patient's family.  We will watch him at least overnight.     Kenneth Sportsman, MD     SCG/MEDQ  D:  06/12/2011  T:  06/13/2011  Job:  [id number]  cc:   Erick Blinks, MD Fax: [phone]  Rachael Fee, MD [address] [place], [place] [address]  Rosalyn Gess. Norins, MD [address] [place] [address]

## 2011-06-14 LAB — CBC
HCT: 37.6 % — ABNORMAL LOW (ref 39.0–52.0)
Hemoglobin: 13.4 g/dL (ref 13.0–17.0)
MCHC: 35.6 g/dL (ref 30.0–36.0)
WBC: 11.6 10*3/uL — ABNORMAL HIGH (ref 4.0–10.5)

## 2011-06-14 MED ORDER — HYDROCODONE-ACETAMINOPHEN 5-325 MG PO TABS: 1.0000 | ORAL_TABLET | Freq: Four times a day (QID) | ORAL | Status: AC | PRN

## 2011-06-14 MED ORDER — SODIUM CHLORIDE 0.9 % IJ SOLN
3.0000 mL | Freq: Two times a day (BID) | INTRAMUSCULAR | Status: DC
  Administered 2011-06-14: 3 mL via INTRAVENOUS

## 2011-06-14 MED ORDER — SODIUM CHLORIDE 0.9 % IJ SOLN: 3.0000 mL | INTRAMUSCULAR | Status: DC | PRN

## 2011-06-14 MED ORDER — HYDROCORTISONE ACE-PRAMOXINE 2.5-1 % RE CREA: 1.0000 "application " | TOPICAL_CREAM | Freq: Four times a day (QID) | RECTAL | Status: AC | PRN

## 2011-06-14 NOTE — Discharge Instructions (Signed)

## 2011-06-14 NOTE — Progress Notes (Signed)
Assessment unchanged. Pt verbalized understanding of dc instructions. Script x1 given as provided by MD. Discharged via foot per pt request accompanied by wife and nurse tech to front entrance to meet awaiting vehicle to carry home.

## 2011-06-14 NOTE — Progress Notes (Signed)
Kenneth Mason [birth year] [id number]  PCP: Illene Regulus, MD, MD Outpatient Care Team: Patient Care Team: Duke Salvia, MD as PCP - General (Internal Medicine) Acey Lav, MD as PCP - Infectious Diseases (Infectious Diseases) Rob Bunting, MD as Consulting Physician (Gastroenterology)  Inpatient Treatment Team: Treatment Team: Attending Provider: Ardeth Sportsman, MD; Technician: Justice Britain, NT; Registered Nurse: Rometta Emery, RN  Subjective:  Pt soreness in abdomen markedly down Walking in hallways Tol liquids OK - was afraid to try solids Friend at bedside, RN in room  Objective:  Vital signs:  Temp:  [97.2 F (36.2 C)-98.5 F (36.9 C)] 97.2 F (36.2 C) (01/13 0445) Pulse Rate:  [50-60] 57  (01/13 0445) Resp:  [16-18] 18  (01/13 0445) BP: (122-132)/(74-82) 122/74 mmHg (01/13 0445) SpO2:  [95 %-97 %] 97 % (01/13 0445) Last BM Date:  (unknown)  Intake/Output   Yesterday:  01/12 0701 - 01/13 0700 In: 2733.3 [I.V.:1733.3; IV Piggyback:1000] Out: 800 [Urine:800] This shift:     Bowel function:  Flatus: Yes BM: Yes  Physical Exam:  General: Pt sleepy but oriented x4 in no acute distress Eyes: PERRL, normal EOM.  Sclera clear.  No icterus Neuro: CN II-XII intact w/o focal sensory/motor deficits. Lymph: No head/neck/groin lymphadenopathy Psych:  No delerium/psychosis/paranoia HENT: Normocephalic, Mucus membranes moist.  No thrush Neck: Supple, No tracheal deviation Chest: Clear.  No chest wall pain w good excursion CV:  Pulses intact.  Regular rhythm Abdomen: Flat, Minimal discomfort.  Nondistended.  No incarcerated hernias. Ext:  SCDs BLE.  No mjr edema.  No cyanosis Skin: No petechiae / purpurae  Results:   Labs: Results for orders placed during the hospital encounter of 06/12/11 (from the past 48 hour(s))  CBC     Status: Abnormal   Collection Time   06/12/11  2:49 PM      Component Value Range Comment   WBC 12.2 (*) 4.0 - 10.5  (K/uL)    RBC 5.27  4.22 - 5.81 (MIL/uL)    Hemoglobin 15.3  13.0 - 17.0 (g/dL)    HCT 40.9  81.1 - 91.4 (%)    MCV 80.1  78.0 - 100.0 (fL)    MCH 29.0  26.0 - 34.0 (pg)    MCHC 36.3 (*) 30.0 - 36.0 (g/dL)    RDW 78.2  95.6 - 21.3 (%)    Platelets 206  150 - 400 (K/uL)   CREATININE, SERUM     Status: Abnormal   Collection Time   06/12/11  2:49 PM      Component Value Range Comment   Creatinine, Ser 1.12  0.50 - 1.35 (mg/dL)    GFR calc non Af Amer 84 (*) >90 (mL/min)    GFR calc Af Amer >90  >90 (mL/min)   CBC     Status: Abnormal   Collection Time   06/13/11  1:05 PM      Component Value Range Comment   WBC 14.2 (*) 4.0 - 10.5 (K/uL)    RBC 5.51  4.22 - 5.81 (MIL/uL)    Hemoglobin 16.1  13.0 - 17.0 (g/dL)    HCT 08.6  57.8 - 46.9 (%)    MCV 79.1  78.0 - 100.0 (fL)    MCH 29.2  26.0 - 34.0 (pg)    MCHC 36.9 (*) 30.0 - 36.0 (g/dL)    RDW 62.9  52.8 - 41.3 (%)    Platelets 205  150 - 400 (K/uL)   BASIC METABOLIC PANEL  Status: Abnormal   Collection Time   06/13/11  1:05 PM      Component Value Range Comment   Sodium 139  135 - 145 (mEq/L)    Potassium 3.4 (*) 3.5 - 5.1 (mEq/L)    Chloride 100  96 - 112 (mEq/L)    CO2 29  19 - 32 (mEq/L)    Glucose, Bld 108 (*) 70 - 99 (mg/dL)    BUN 10  6 - 23 (mg/dL)    Creatinine, Ser 1.61  0.50 - 1.35 (mg/dL)    Calcium 9.5  8.4 - 10.5 (mg/dL)    GFR calc non Af Amer 72 (*) >90 (mL/min)    GFR calc Af Amer 83 (*) >90 (mL/min)   CBC     Status: Abnormal   Collection Time   06/14/11  4:32 AM      Component Value Range Comment   WBC 11.6 (*) 4.0 - 10.5 (K/uL)    RBC 4.68  4.22 - 5.81 (MIL/uL)    Hemoglobin 13.4  13.0 - 17.0 (g/dL) DELTA CHECK NOTED   HCT 37.6 (*) 39.0 - 52.0 (%)    MCV 80.3  78.0 - 100.0 (fL)    MCH 28.6  26.0 - 34.0 (pg)    MCHC 35.6  30.0 - 36.0 (g/dL)    RDW 09.6  04.5 - 40.9 (%)    Platelets 185  150 - 400 (K/uL)     Imaging / Studies: No results found.  Medications / Allergies: per  chart  Antibiotics: Anti-infectives     Start     Dose/Rate Route Frequency Ordered Stop   06/13/11 1400   ciprofloxacin (CIPRO) tablet 500 mg        500 mg Oral 2 times daily 06/13/11 1212     06/13/11 1400   metroNIDAZOLE (FLAGYL) tablet 500 mg        500 mg Oral Every 6 hours 06/13/11 1212     06/12/11 0630   cefOXitin (MEFOXIN) 2 g in dextrose 5 % 50 mL IVPB        2 g 100 mL/hr over 30 Minutes Intravenous 60 min pre-op 06/12/11 8119 06/12/11 0739          Assessment  Anees Copeman  36 y.o. 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  Bowels functioning OK  Plan: -bowel regimen -anxiolysis -stop ABx on D/C -retry PO as tolerated -D/C IVF -VTE prophylaxis- SCDs, etc -mobilize as tolerated to help recovery -f/u path next week  Prob D/C later today if tolerates breakfast. D/C instructions d/w patient & friend. F/u [facility] 1-2 weeks  Ardeth Sportsman, M.D., F.A.C.S. Gastrointestinal and Minimally Invasive Surgery [facility] [address], [address] [place], [place] [address] [phone] Main / Paging [phone] Voice Mail   06/14/2011

## 2011-06-15 ENCOUNTER — Encounter (HOSPITAL_COMMUNITY): Payer: Self-pay | Admitting: Surgery

## 2011-06-15 NOTE — Discharge Summary (Signed)
Physician Discharge Summary  Patient ID: Kenneth Mason MRN: [id number] DOB/AGE: 1975-08-02 36 y.o.  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 The first day postoperatively, he had some abdominal discomfort. He felt a little jittery. However, he began to have flatus and feel more comfortable. I had him on antibiotics for postop day one.  By postoperative day #2, he was walking in the hallways. He had minimal discomfort. He was having flatus. He was tolerating oral intake well.  Based on improvements I thought it was reasonable to have him go home with close followup. Therefore he was discharged  Consults: none  Significant Diagnostic Studies:   Treatments: surgery: Partial proctectomy by TEM  Discharge Exam: Blood pressure 148/82, pulse 58, temperature 98.5 F (36.9 C), temperature source Oral, resp. rate 20, height 5\' 10"  (1.778 m), weight 162 lb (73.483 kg), SpO2 97.00%.  General: Pt awake/alert/oriented x4 in no major acute distress Eyes: PERRL, normal EOM. Neuro: CN II-XII intact w/o focal sensory/motor deficits. Lymph: No head/neck/groin lymphadenopathy Psych:  No delerium/psychosis/paranoia HEENT: Normocephalic, Mucus membranes moist.  No thrush Neck: Supple, No tracheal deviation Chest: No pain w good excursion CV:  Pulses intact.  Regular rhythm Abdomen: soft, Nontender/nondistended.  No incarcerated hernias. Ext:  SCDs BLE.  No mjr edema.  No cyanosis Skin: No petechiae / purpurae   Disposition: Home or Self Care  Discharge Orders    Future Appointments: Provider: Department: Dept Phone:  Center:   08/05/2011 4:00 PM Acey Lav, MD [facility] [phone] [facility]     Future Orders Please Complete By Expires   Diet - low sodium heart healthy      Increase activity slowly      Call MD for:      Scheduling Instructions:   See [facility] Abdominal surgery D/C  Instructions     Medication List  As of 06/15/2011  1:02 PM   TAKE these medications         citalopram 20 MG tablet   Commonly known as: CELEXA   Take 20 mg by mouth daily with breakfast.      clonazePAM 0.5 MG tablet   Commonly known as: KLONOPIN   Take 0.5 mg by mouth at bedtime as needed. Sleep        cyclobenzaprine 10 MG tablet   Commonly known as: FLEXERIL   Take 1 tablet by mouth Every 8 hours as needed. Muscle spasm          diazepam 5 MG tablet   Commonly known as: VALIUM   Take 1 tablet (5 mg total) by mouth at bedtime as needed (spasm and pain).      HYDROcodone-acetaminophen 5-325 MG per tablet   Commonly known as: NORCO   Take 1-2 tablets by mouth every 6 (six) hours as needed for pain.      hydrocortisone-pramoxine 2.5-1 % rectal cream   Commonly known as: ANALPRAM-HC   Place 1 application rectally 4 (four) times daily as needed (anal pain).      ibuprofen 800 MG tablet   Commonly known as: ADVIL,MOTRIN   Take 1 tablet by mouth Every 8 hours as needed. Pain  Follow-up Information    Follow up with Azahel Belcastro C., MD in 10 days.   Contact information:   [employer] [address] [place] [address] [phone]          Signed: Ardeth Sportsman. 06/15/2011, 1:02 PM

## 2011-06-17 ENCOUNTER — Telehealth (INDEPENDENT_AMBULATORY_CARE_PROVIDER_SITE_OTHER): Payer: Self-pay

## 2011-06-17 NOTE — Telephone Encounter (Signed)
LMOM with pt of the appt time for his po appt with Dr Michaell Cowing and asked him to call our office for his path report.

## 2011-07-01 ENCOUNTER — Ambulatory Visit (INDEPENDENT_AMBULATORY_CARE_PROVIDER_SITE_OTHER): Payer: 59 | Admitting: Surgery

## 2011-07-01 ENCOUNTER — Encounter (INDEPENDENT_AMBULATORY_CARE_PROVIDER_SITE_OTHER): Payer: Self-pay | Admitting: Surgery

## 2011-07-01 VITALS — BP 108/70 | HR 64 | Temp 97.6°F | Resp 16 | Ht 70.0 in | Wt 159.4 lb

## 2011-07-01 DIAGNOSIS — R198 Other specified symptoms and signs involving the digestive system and abdomen: Secondary | ICD-10-CM

## 2011-07-01 DIAGNOSIS — K6289 Other specified diseases of anus and rectum: Secondary | ICD-10-CM

## 2011-07-01 NOTE — Progress Notes (Signed)
Subjective:     Patient ID: Kenneth Mason, male   DOB: 1976-01-30, 36 y.o.   MRN: [id number]  HPI  Kenneth Mason  [birth year] [id number]  Patient Care Team: Duke Salvia, MD as PCP - General (Internal Medicine) Acey Lav, MD as PCP - Infectious Diseases (Infectious Diseases) Rob Bunting, MD as Consulting Physician (Gastroenterology)  This patient is a 36 y.o.male who presents today for surgical evaluation.   Diagnosis: Rectal wall mass  Procedure: TEM partial proctectomy 06/12/2011  Pathology: BENIGN COLONIC SUBMUCOSAL CYST ASSOCIATED WITH DYSTROPHIC CALCIFICATIONS. NO EVIDENCE OF MALIGNANCY.  The patient comes in today feeling rather well. Some rectal bleeding but it is calming down. Daily bowel movements. No fevers chills or sweats. No nausea or vomiting. Energy level is good.  Patient Active Problem List  Diagnoses  . Depression  . HIV infection  . Rectal cyst - benign  . Laryngitis, chronic  . Blood per rectum  . Chronic tension headaches  . Lymphadenopathy, neck (esp sublingual)    Past Medical History  Diagnosis Date  . Headache in front of head     q 2 weeks. No n/v, no photophobia. Responds to NSAIDs  . Rectal polyp 1999    polyp excised  . Anxiety   . HIV infection     dx'd HIV July 12; CD4 1100.  Presented with lymphadenopathy  . Depression     Past Surgical History  Procedure Date  . Dental surgery 2010    Wisdom Teeth   . Eus 04/09/2011    Procedure: LOWER ENDOSCOPIC ULTRASOUND (EUS);  Surgeon: Rob Bunting, MD;  Location: [facility];  Service: Endoscopy;  Laterality: N/A;  . Flexible sigmoidoscopy 04/09/2011    Procedure: FLEXIBLE SIGMOIDOSCOPY;  Surgeon: Rob Bunting, MD;  Location: [facility];  Service: Endoscopy;  Laterality: N/A;  . Transanal endoscopic microsurgery 06/12/2011    Procedure: TRANSANAL ENDOSCOPIC MICROSURGERY;  Surgeon: Ardeth Sportsman, MD;  Location: [facility];  Service: General;  Laterality: N/A;  partial  proctectomy by TEM    History   Social History  . Marital Status: Married    Spouse Name: N/A    Number of Children: 2  . Years of Education: 14   Occupational History  . Customer Service     Social History Main Topics  . Smoking status: Never Smoker   . Smokeless tobacco: Never Used  . Alcohol Use: Yes     rarely - once a month   . Drug Use: No  . Sexually Active: Yes -- Male, Male partner(s)   Other Topics Concern  . Not on file   Social History Narrative   HSG, 2 yrs college. Married '04 - seperated (2012). !son ' 2005; 1  dtr - '08. 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  Lives with children. Wife/ mother is abroad (aug '12).    Family History  Problem Relation Age of Onset  . Hypertension Mother   . Cancer Maternal Grandfather     unsure of type   . Liver cancer Paternal Grandmother   . Stroke Paternal Grandmother   . Heart disease Paternal Grandfather     CAD/MI-fatal    Current outpatient prescriptions:citalopram (CELEXA) 20 MG tablet, Take 20 mg by mouth daily with breakfast. , Disp: , Rfl: ;  clonazePAM (KLONOPIN) 0.5 MG tablet, Take 0.5 mg by mouth at bedtime as needed. Sleep , Disp: , Rfl: ;  cyclobenzaprine (FLEXERIL) 10  MG tablet, Take 1 tablet by mouth Every 8 hours as needed. Muscle spasm  , Disp: , Rfl:  diazepam (VALIUM) 5 MG tablet, Take 1 tablet (5 mg total) by mouth at bedtime as needed (spasm and pain)., Disp: 30 tablet, Rfl: 0;  ibuprofen (ADVIL,MOTRIN) 800 MG tablet, Take 1 tablet by mouth Every 8 hours as needed. Pain  , Disp: , Rfl:   No Known Allergies  BP 108/70  Pulse 64  Temp(Src) 97.6 F (36.4 C) (Temporal)  Resp 16  Ht 5\' 10"  (1.778 m)  Wt 159 lb 6.4 oz (72.303 kg)  BMI 22.87 kg/m2     Review of Systems  Constitutional: Negative for fever, chills and diaphoresis.  HENT: Negative for sore throat, trouble swallowing and neck pain.   Eyes:  Negative for photophobia and visual disturbance.  Respiratory: Negative for choking and shortness of breath.   Cardiovascular: Negative for chest pain and palpitations.  Gastrointestinal: Positive for blood in stool. Negative for nausea, vomiting, abdominal pain, diarrhea, constipation, abdominal distention, anal bleeding and rectal pain.  Genitourinary: Negative for dysuria, urgency, difficulty urinating and testicular pain.  Musculoskeletal: Negative for myalgias, arthralgias and gait problem.  Skin: Negative for color change and rash.  Neurological: Negative for dizziness, speech difficulty, weakness and numbness.  Hematological: Negative for adenopathy.  Psychiatric/Behavioral: Negative for hallucinations, confusion and agitation.       Objective:   Physical Exam  Constitutional: He is oriented to person, place, and time. He appears well-developed and well-nourished. No distress.  HENT:  Head: Normocephalic.  Mouth/Throat: Oropharynx is clear and moist. No oropharyngeal exudate.  Eyes: Conjunctivae and EOM are normal. Pupils are equal, round, and reactive to light. No scleral icterus.  Neck: Normal range of motion. No tracheal deviation present.  Cardiovascular: Normal rate, normal heart sounds and intact distal pulses.   Pulmonary/Chest: Effort normal. No respiratory distress.  Abdominal: Soft. He exhibits no distension. There is no tenderness. Hernia confirmed negative in the right inguinal area and confirmed negative in the left inguinal area.       Incisions clean with normal healing ridges.  No hernias  Genitourinary: No penile tenderness.       Perianal skin clean with good hygiene.  No pruritis.  No pilonidal disease.  No fissure.  No abscess/fistula.    Tolerates digital exam.  Normal sphincter tone.   Incision in rectum intact - no mass/drainage   Musculoskeletal: Normal range of motion. He exhibits no tenderness.  Neurological: He is alert and oriented to person, place,  and time. No cranial nerve deficit. He exhibits normal muscle tone. Coordination normal.  Skin: Skin is warm and dry. No rash noted. He is not diaphoretic.  Psychiatric: He has a normal mood and affect. His behavior is normal.       Assessment:     2.5 weeks s/p removal of rectal wall benign cyst, recovering well    Plan:     Increase activity as tolerated.  Do not push through pain.  Advanced on diet as tolerated. Bowel regimen to avoid problems.  Return to clinic p.r.n. If bleeding worsens / does not resolve, rectal pain, etc.  No need for f/u colonoscopy from my standpoint.  The patient expressed understanding and appreciation

## 2011-08-05 ENCOUNTER — Ambulatory Visit (INDEPENDENT_AMBULATORY_CARE_PROVIDER_SITE_OTHER): Payer: 59 | Admitting: Infectious Disease

## 2011-08-05 ENCOUNTER — Encounter: Payer: Self-pay | Admitting: Infectious Disease

## 2011-08-05 VITALS — BP 109/73 | HR 71 | Temp 97.3°F | Ht 70.0 in | Wt 162.0 lb

## 2011-08-05 DIAGNOSIS — B20 Human immunodeficiency virus [HIV] disease: Secondary | ICD-10-CM

## 2011-08-05 DIAGNOSIS — Z21 Asymptomatic human immunodeficiency virus [HIV] infection status: Secondary | ICD-10-CM

## 2011-08-05 DIAGNOSIS — IMO0002 Reserved for concepts with insufficient information to code with codable children: Secondary | ICD-10-CM

## 2011-08-05 NOTE — Assessment & Plan Note (Signed)
Pt would be perfect candidate for START though the age cutoff willl move to 96 and older soon in which case he will lose  A window. Again he would be fine candidate for ACTG 5303  maraviroc [MVC] + emtricitabine [FTC] + darunavir [DRV] + ritonavir [RTV] once-daily) or a standard regimen (tenofovir [TDF] + FTC + DRV + RTV once-daily) for 48 weeks.

## 2011-08-05 NOTE — Assessment & Plan Note (Signed)
Pt had beningn colonic cyst removed by Dr. Michaell Cowing.

## 2011-08-05 NOTE — Progress Notes (Signed)
  Subjective:    Patient ID: Kenneth Mason, male    DOB: Jun 19, 1975, 36 y.o.   MRN: [id number]  HPI  36 year old African American male with HIV, high CD4 count, ARV naive considering entering into START trial, randmomizing between starting or deferring rx with CD4>500. I informed him that the study will be changing criteria to an minimum age limit as it gets close to enrolling. He would also be candidate for ACTG trial for naive pts. He is not ready to enroll this month or next two months. I am getting repeat labs today. Emphasized safe sex with condoms in particular because he is not on ARV therapy.  Review of Systems  Constitutional: Negative for fever, chills, diaphoresis, activity change, appetite change, fatigue and unexpected weight change.  HENT: Negative for congestion, sore throat, rhinorrhea, sneezing, trouble swallowing and sinus pressure.   Eyes: Negative for photophobia and visual disturbance.  Respiratory: Negative for cough, chest tightness, shortness of breath, wheezing and stridor.   Cardiovascular: Negative for chest pain, palpitations and leg swelling.  Gastrointestinal: Negative for nausea, vomiting, abdominal pain, diarrhea, constipation, blood in stool, abdominal distention and anal bleeding.  Genitourinary: Negative for dysuria, hematuria, flank pain and difficulty urinating.  Musculoskeletal: Negative for myalgias, back pain, joint swelling, arthralgias and gait problem.  Skin: Negative for color change, pallor, rash and wound.  Neurological: Negative for dizziness, tremors, weakness and light-headedness.  Hematological: Negative for adenopathy. Does not bruise/bleed easily.  Psychiatric/Behavioral: Negative for behavioral problems, confusion, sleep disturbance, dysphoric mood, decreased concentration and agitation.       Objective:   Physical Exam  Constitutional: He is oriented to person, place, and time. He appears well-developed and well-nourished. No distress.    HENT:  Head: Normocephalic and atraumatic.  Mouth/Throat: Oropharynx is clear and moist. No oropharyngeal exudate.  Eyes: Conjunctivae and EOM are normal. Pupils are equal, round, and reactive to light. No scleral icterus.  Neck: Normal range of motion. Neck supple. No JVD present.  Cardiovascular: Normal rate, regular rhythm and normal heart sounds.  Exam reveals no gallop and no friction rub.   No murmur heard. Pulmonary/Chest: Effort normal and breath sounds normal. No respiratory distress. He has no wheezes. He has no rales. He exhibits no tenderness.  Abdominal: He exhibits no distension and no mass. There is no tenderness. There is no rebound and no guarding.  Musculoskeletal: He exhibits no edema and no tenderness.  Lymphadenopathy:    He has no cervical adenopathy.  Neurological: He is alert and oriented to person, place, and time. He has normal reflexes. He exhibits normal muscle tone. Coordination normal.  Skin: Skin is warm and dry. He is not diaphoretic. No erythema. No pallor.  Psychiatric: He has a normal mood and affect. His behavior is normal. Judgment and thought content normal.          Assessment & Plan:  HIV infection Pt would be perfect candidate for START though the age cutoff willl move to 10 and older soon in which case he will lose  A window. Again he would be fine candidate for ACTG 5303  maraviroc [MVC] + emtricitabine [FTC] + darunavir [DRV] + ritonavir [RTV] once-daily) or a standard regimen (tenofovir [TDF] + FTC + DRV + RTV once-daily) for 48 weeks.     Cyst Pt had beningn colonic cyst removed by Dr. Michaell Cowing.

## 2011-08-06 LAB — COMPLETE METABOLIC PANEL WITH GFR
ALT: 16 U/L (ref 0–53)
CO2: 29 mEq/L (ref 19–32)
Chloride: 105 mEq/L (ref 96–112)
GFR, Est African American: >89 mL/min
Potassium: 3.8 mEq/L (ref 3.5–5.3)
Sodium: 143 mEq/L (ref 135–145)
Total Bilirubin: 0.4 mg/dL (ref 0.3–1.2)
Total Protein: 6.7 g/dL (ref 6.0–8.3)

## 2011-08-06 LAB — CBC WITH DIFFERENTIAL/PLATELET
Eosinophils Absolute: 0.1 10*3/uL (ref 0.0–0.7)
Lymphocytes Relative: 50 % — ABNORMAL HIGH (ref 12–46)
Lymphs Abs: 4.2 10*3/uL — ABNORMAL HIGH (ref 0.7–4.0)
Neutro Abs: 3.5 10*3/uL (ref 1.7–7.7)
Neutrophils Relative %: 43 % (ref 43–77)
Platelets: 236 10*3/uL (ref 150–400)
RBC: 5.53 MIL/uL (ref 4.22–5.81)
WBC: 8.3 10*3/uL (ref 4.0–10.5)

## 2011-08-06 LAB — RPR

## 2011-08-07 LAB — HIV-1 RNA QUANT-NO REFLEX-BLD
HIV 1 RNA Quant: 5359 copies/mL — ABNORMAL HIGH (ref <20)
HIV-1 RNA Quant, Log: 3.73 {Log} — ABNORMAL HIGH (ref <1.30)

## 2011-09-11 ENCOUNTER — Telehealth: Payer: Self-pay | Admitting: Internal Medicine

## 2011-09-11 NOTE — Telephone Encounter (Signed)
The patient called and is hoping to get a referral to an Ear Nose and Throat Dr.  Dr.Norins put a note in the last office visit (01/30/11) about the referral.  Do I need to send this to you, or someone else?? (sorry i dont know exactly how to handle this with him on vacation)  Thanks!

## 2011-10-29 ENCOUNTER — Telehealth: Payer: Self-pay | Admitting: Internal Medicine

## 2011-10-29 NOTE — Telephone Encounter (Signed)
If you would like the pt worked in today, please forward this msg to Nancy or Sharon.  I have to run to an appt for the rest of the afternoon.  I'll be happy to call the patient back first thing in the am.    Thanks!!  

## 2011-10-29 NOTE — Telephone Encounter (Signed)
The pt called and is hoping to be worked in for an apt for ear pain and swelling.  Do you want him worked in?   [phone]

## 2011-10-30 NOTE — Telephone Encounter (Signed)
Sorry!! I'm really looking forward to learning how to accommodate your work flow better & communicate more effectively!   lvmom for pt.

## 2011-10-30 NOTE — Telephone Encounter (Signed)
Didn't see until now! Saturday clinic is appropriate

## 2011-11-16 ENCOUNTER — Telehealth: Payer: Self-pay | Admitting: *Deleted

## 2011-11-16 NOTE — Telephone Encounter (Signed)
R"cd phone call from [facility] office requesting a callback-left message to return call to office.

## 2011-11-17 NOTE — Telephone Encounter (Signed)
Request OV notes for dates of service for 04/23/2011, 04/28/2011, 05/04/2011. Release of info signed by pt for [employer] office. OV notes faxed to [phone] for dates requested.

## 2011-12-17 ENCOUNTER — Telehealth: Payer: Self-pay | Admitting: Licensed Clinical Social Worker

## 2011-12-17 NOTE — Telephone Encounter (Signed)
Patient called stating that he was in trouble at work because his FMLA form that we completed did not have his last ov of 08/05/2011 on the form. Patient states I could print the old one and add the date of 3/6 on the form and put my initials on it. I did this and faxed it to his company.

## 2012-01-20 ENCOUNTER — Other Ambulatory Visit: Payer: 59

## 2012-01-20 DIAGNOSIS — B20 Human immunodeficiency virus [HIV] disease: Secondary | ICD-10-CM

## 2012-01-20 LAB — CBC WITH DIFFERENTIAL/PLATELET
Eosinophils Absolute: 0.1 10*3/uL (ref 0.0–0.7)
Eosinophils Relative: 1 % (ref 0–5)
HCT: 41.2 % (ref 39.0–52.0)
Hemoglobin: 14.7 g/dL (ref 13.0–17.0)
Lymphs Abs: 3.5 10*3/uL (ref 0.7–4.0)
MCH: 28.7 pg (ref 26.0–34.0)
MCV: 80.3 fL (ref 78.0–100.0)
Monocytes Relative: 6 % (ref 3–12)
RBC: 5.13 MIL/uL (ref 4.22–5.81)

## 2012-01-21 LAB — COMPLETE METABOLIC PANEL WITHOUT GFR
ALT: 123 U/L — ABNORMAL HIGH (ref 0–53)
AST: 243 U/L — ABNORMAL HIGH (ref 0–37)
Albumin: 4.2 g/dL (ref 3.5–5.2)
Alkaline Phosphatase: 64 U/L (ref 39–117)
BUN: 11 mg/dL (ref 6–23)
CO2: 30 meq/L (ref 19–32)
Calcium: 9.5 mg/dL (ref 8.4–10.5)
Chloride: 103 meq/L (ref 96–112)
Creat: 1.39 mg/dL — ABNORMAL HIGH (ref 0.50–1.35)
GFR, Est African American: 76 mL/min
GFR, Est Non African American: 66 mL/min
Glucose, Bld: 58 mg/dL — ABNORMAL LOW (ref 70–99)
Potassium: 4.4 meq/L (ref 3.5–5.3)
Sodium: 140 meq/L (ref 135–145)
Total Bilirubin: 0.6 mg/dL (ref 0.3–1.2)
Total Protein: 6.6 g/dL (ref 6.0–8.3)

## 2012-01-21 LAB — HIV-1 RNA QUANT-NO REFLEX-BLD
HIV 1 RNA Quant: 6983 copies/mL — ABNORMAL HIGH (ref <20)
HIV-1 RNA Quant, Log: 3.84 {Log} — ABNORMAL HIGH (ref <1.30)

## 2012-01-21 LAB — T-HELPER CELL (CD4) - (RCID CLINIC ONLY)
CD4 % Helper T Cell: 27 % — ABNORMAL LOW (ref 33–55)
CD4 T Cell Abs: 940 uL (ref 400–2700)

## 2012-02-03 ENCOUNTER — Encounter: Payer: Self-pay | Admitting: Infectious Disease

## 2012-02-03 ENCOUNTER — Ambulatory Visit (INDEPENDENT_AMBULATORY_CARE_PROVIDER_SITE_OTHER): Payer: 59 | Admitting: Infectious Disease

## 2012-02-03 VITALS — BP 119/72 | HR 58 | Temp 97.7°F | Ht 70.0 in | Wt 161.0 lb

## 2012-02-03 DIAGNOSIS — Z21 Asymptomatic human immunodeficiency virus [HIV] infection status: Secondary | ICD-10-CM

## 2012-02-03 DIAGNOSIS — F329 Major depressive disorder, single episode, unspecified: Secondary | ICD-10-CM

## 2012-02-03 DIAGNOSIS — B20 Human immunodeficiency virus [HIV] disease: Secondary | ICD-10-CM

## 2012-02-03 NOTE — Assessment & Plan Note (Signed)
I recommended that he start antiretroviral therapy, options were discussed as above.

## 2012-02-03 NOTE — Progress Notes (Signed)
Subjective:    Patient ID: Kenneth Mason, male    DOB: 05-Apr-1976, 36 y.o.   MRN: [id number]  HPI  36 year old [place] American man who has HIV infection but healthy CD4 count. He had considered enrolling in the Star trial before but has been reluctant given that he was not ready to start therapy. He is no longer eligible for that study because of his age and closing of his that he to that group.   I have recommended strongly to him that he start anti-retroviral regimen and in accordance with the Department of Health and Human Services recommendations for any HIV infected patient. We have reviewed strength of evidence for recommendations. I reviewed various first-line antiretroviral regimens. He seems most interested in single tablet regimens such as STRIBILD or Atripla. Complera he dislikes becaue of requirement. I also offered him 2 other integrates inhibitor-based regimens namely Isentress twice daily once daily Truvada versus newly approved Tivicay and truvada once daily. He wishes to consider these various antiretroviral options and to return to clinic in one month's time to discuss them. I spent greater than 45 minutes with the patient including greater than 50% of time in face to face counsel of the patient and in coordination of their care.   Review of Systems  Constitutional: Negative for fever, chills, diaphoresis, activity change, appetite change, fatigue and unexpected weight change.  HENT: Negative for congestion, sore throat, rhinorrhea, sneezing, trouble swallowing and sinus pressure.   Eyes: Negative for photophobia and visual disturbance.  Respiratory: Negative for cough, chest tightness, shortness of breath, wheezing and stridor.   Cardiovascular: Negative for chest pain, palpitations and leg swelling.  Gastrointestinal: Negative for nausea, vomiting, abdominal pain, diarrhea, constipation, blood in stool, abdominal distention and anal bleeding.  Genitourinary: Negative for dysuria,  hematuria, flank pain and difficulty urinating.  Musculoskeletal: Negative for myalgias, back pain, joint swelling, arthralgias and gait problem.  Skin: Negative for color change, pallor, rash and wound.  Neurological: Negative for dizziness, tremors, weakness and light-headedness.  Hematological: Negative for adenopathy. Does not bruise/bleed easily.  Psychiatric/Behavioral: Negative for behavioral problems, confusion, disturbed wake/sleep cycle, dysphoric mood, decreased concentration and agitation.       Objective:   Physical Exam  Constitutional: He is oriented to person, place, and time. He appears well-developed and well-nourished. No distress.  HENT:  Head: Normocephalic and atraumatic.  Mouth/Throat: Oropharynx is clear and moist. No oropharyngeal exudate.  Eyes: Conjunctivae and EOM are normal. Pupils are equal, round, and reactive to light. No scleral icterus.  Neck: Normal range of motion. Neck supple. No JVD present.  Cardiovascular: Normal rate, regular rhythm and normal heart sounds.  Exam reveals no gallop and no friction rub.   No murmur heard. Pulmonary/Chest: Effort normal and breath sounds normal. No respiratory distress. He has no wheezes. He has no rales. He exhibits no tenderness.  Abdominal: He exhibits no distension and no mass. There is no tenderness. There is no rebound and no guarding.  Musculoskeletal: He exhibits no edema and no tenderness.  Lymphadenopathy:    He has no cervical adenopathy.  Neurological: He is alert and oriented to person, place, and time. He has normal reflexes. He exhibits normal muscle tone. Coordination normal.  Skin: Skin is warm and dry. He is not diaphoretic. No erythema. No pallor.  Psychiatric: He has a normal mood and affect. His behavior is normal. Judgment and thought content normal.          Assessment & Plan:  HIV  infection I recommended that he start antiretroviral therapy, options were discussed as  above.  Depression He still but anxious in particular with relation to his work. He will feel less so he do believe the next 3 months time.

## 2012-02-03 NOTE — Assessment & Plan Note (Signed)
He still but anxious in particular with relation to his work. He will feel less so he do believe the next 3 months time.

## 2012-02-03 NOTE — Patient Instructions (Addendum)
We talked about the following antiviral regimens:  Single tablet regimens:  #1 ATRIPLA ONE PILL ONCE DAILYI   #2 STRIBILD ONE PILL ONCE DAILY  AND FOLLOWING TWO PILL REGMEINS  #3 TIVICAY ONE PILL DAILY WITH TRUVADA ONE PILL DAILY  #4 ISENTRESS ONE TABLET TWICE DAILY AND TRUVADA ONE PILL DAILY

## 2012-06-16 ENCOUNTER — Other Ambulatory Visit (INDEPENDENT_AMBULATORY_CARE_PROVIDER_SITE_OTHER): Payer: 59

## 2012-06-16 DIAGNOSIS — B20 Human immunodeficiency virus [HIV] disease: Secondary | ICD-10-CM

## 2012-06-16 DIAGNOSIS — Z21 Asymptomatic human immunodeficiency virus [HIV] infection status: Secondary | ICD-10-CM

## 2012-06-16 LAB — CBC WITH DIFFERENTIAL/PLATELET
Eosinophils Absolute: 0.1 10*3/uL (ref 0.0–0.7)
Eosinophils Relative: 2 % (ref 0–5)
Hemoglobin: 16 g/dL (ref 13.0–17.0)
Lymphocytes Relative: 53 % — ABNORMAL HIGH (ref 12–46)
Lymphs Abs: 3.6 10*3/uL (ref 0.7–4.0)
MCH: 28.7 pg (ref 26.0–34.0)
MCV: 80.8 fL (ref 78.0–100.0)
Monocytes Relative: 6 % (ref 3–12)
Neutrophils Relative %: 39 % — ABNORMAL LOW (ref 43–77)
RBC: 5.58 MIL/uL (ref 4.22–5.81)

## 2012-06-17 ENCOUNTER — Other Ambulatory Visit: Payer: 59

## 2012-06-17 LAB — LIPID PANEL
Cholesterol: 136 mg/dL (ref 0–200)
LDL Cholesterol: 70 mg/dL (ref 0–99)
Total CHOL/HDL Ratio: 3.5 Ratio
VLDL: 27 mg/dL (ref 0–40)

## 2012-06-17 LAB — COMPLETE METABOLIC PANEL WITH GFR
AST: 19 U/L (ref 0–37)
Albumin: 3.9 g/dL (ref 3.5–5.2)
BUN: 9 mg/dL (ref 6–23)
Calcium: 9.3 mg/dL (ref 8.4–10.5)
Chloride: 102 mEq/L (ref 96–112)
Potassium: 4.1 mEq/L (ref 3.5–5.3)
Sodium: 140 mEq/L (ref 135–145)
Total Protein: 6.5 g/dL (ref 6.0–8.3)

## 2012-06-17 LAB — T-HELPER CELL (CD4) - (RCID CLINIC ONLY)
CD4 % Helper T Cell: 21 % — ABNORMAL LOW (ref 33–55)
CD4 T Cell Abs: 800 uL (ref 400–2700)

## 2012-06-17 LAB — RPR

## 2012-06-19 LAB — HIV-1 RNA QUANT-NO REFLEX-BLD: HIV 1 RNA Quant: 12397 copies/mL — ABNORMAL HIGH (ref <20)

## 2012-06-28 ENCOUNTER — Ambulatory Visit (INDEPENDENT_AMBULATORY_CARE_PROVIDER_SITE_OTHER): Payer: 59 | Admitting: Infectious Disease

## 2012-06-28 ENCOUNTER — Encounter: Payer: Self-pay | Admitting: Infectious Disease

## 2012-06-28 ENCOUNTER — Ambulatory Visit: Payer: 59

## 2012-06-28 VITALS — BP 125/81 | HR 58 | Temp 97.9°F | Wt 163.5 lb

## 2012-06-28 DIAGNOSIS — Z21 Asymptomatic human immunodeficiency virus [HIV] infection status: Secondary | ICD-10-CM

## 2012-06-28 DIAGNOSIS — F4320 Adjustment disorder, unspecified: Secondary | ICD-10-CM

## 2012-06-28 DIAGNOSIS — B20 Human immunodeficiency virus [HIV] disease: Secondary | ICD-10-CM

## 2012-06-28 DIAGNOSIS — Z23 Encounter for immunization: Secondary | ICD-10-CM

## 2012-06-28 MED ORDER — EMTRICITAB-RILPIVIR-TENOFOV DF 200-25-300 MG PO TABS: 1.0000 | ORAL_TABLET | Freq: Every day | ORAL | Status: DC
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
## 2012-06-28 NOTE — Progress Notes (Signed)
  Subjective:    Patient ID: Kenneth Mason, male    DOB: 11-16-1975, 37 y.o.   MRN: [id number]  HPI  37 year old man with HIV, naive to therapy who had continued to put off starting ARV. After further discussion today of all first line ARVS we decided to go with Complera. He also is in need of his flu shot. I spent greater than 45 minutes with the patient including greater than 50% of time in face to face counsel of the patient and in coordination of their care.   Review of Systems  Constitutional: Negative for fever, chills, diaphoresis, activity change, appetite change, fatigue and unexpected weight change.  HENT: Negative for congestion, sore throat, rhinorrhea, sneezing, trouble swallowing and sinus pressure.   Eyes: Negative for photophobia and visual disturbance.  Respiratory: Negative for cough, chest tightness, shortness of breath, wheezing and stridor.   Cardiovascular: Negative for chest pain, palpitations and leg swelling.  Gastrointestinal: Negative for nausea, vomiting, abdominal pain, diarrhea, constipation, blood in stool, abdominal distention and anal bleeding.  Genitourinary: Negative for dysuria, hematuria, flank pain and difficulty urinating.  Musculoskeletal: Negative for myalgias, back pain, joint swelling, arthralgias and gait problem.  Skin: Negative for color change, pallor, rash and wound.  Neurological: Negative for dizziness, tremors, weakness and light-headedness.  Hematological: Negative for adenopathy. Does not bruise/bleed easily.  Psychiatric/Behavioral: Negative for behavioral problems, confusion, sleep disturbance, dysphoric mood, decreased concentration and agitation.       Objective:   Physical Exam  Constitutional: He is oriented to person, place, and time. He appears well-developed and well-nourished. No distress.  HENT:  Head: Normocephalic and atraumatic.  Mouth/Throat: Oropharynx is clear and moist. No oropharyngeal exudate.  Eyes: Conjunctivae  normal and EOM are normal. Pupils are equal, round, and reactive to light. No scleral icterus.  Neck: Normal range of motion. Neck supple. No JVD present.  Cardiovascular: Normal rate, regular rhythm and normal heart sounds.  Exam reveals no gallop and no friction rub.   No murmur heard. Pulmonary/Chest: Effort normal and breath sounds normal. No respiratory distress. He has no wheezes. He has no rales. He exhibits no tenderness.  Abdominal: He exhibits no distension and no mass. There is no tenderness. There is no rebound and no guarding.  Musculoskeletal: He exhibits no edema and no tenderness.  Lymphadenopathy:    He has no cervical adenopathy.  Neurological: He is alert and oriented to person, place, and time. He exhibits normal muscle tone. Coordination normal.  Skin: Skin is warm and dry. He is not diaphoretic. No erythema. No pallor.  Psychiatric: He has a normal mood and affect. His behavior is normal. Judgment and thought content normal.          Assessment & Plan:  HIV: start complera. NO PPIs with this medicine. No simultaneous antacids. Needs 500kcal meal with fat with medicine. Recheck vl and cd4 in 1 month  Need for prophylactic vaccination: flu shot today  STD screening: RPR, GC and chlamydia negative

## 2012-06-30 ENCOUNTER — Ambulatory Visit: Payer: 59 | Admitting: Infectious Disease

## 2012-06-30 NOTE — Progress Notes (Signed)
HPI: Kenneth Mason is a 37 y.o. male with HIV who is here for his follow up.  Allergies: No Known Allergies  Vitals:    Past Medical History: Past Medical History  Diagnosis Date  . Headache in front of head     q 2 weeks. No n/v, no photophobia. Responds to NSAIDs  . Rectal polyp 1999    polyp excised  . Anxiety   . HIV infection     dx'd HIV July 12; CD4 1100.  Presented with lymphadenopathy  . Depression   . Benign rectal wall submucosal cyst s/p TEM partial proctectomy [id number]      Social History: History   Social History  . Marital Status: Married    Spouse Name: N/A    Number of Children: 2  . Years of Education: 14   Occupational History  . Customer Service     Social History Main Topics  . Smoking status: Never Smoker   . Smokeless tobacco: Never Used  . Alcohol Use: Yes     Comment: rarely - once a month   . Drug Use: No  . Sexually Active: Yes -- Male, Male partner(s)   Other Topics Concern  . None   Social History Narrative   HSG, 2 yrs college. Married '04 - seperated (2012). !son ' 2005; 1  dtr - '08. 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  Lives with children. Wife/ mother is abroad (aug '12).    Home Medications:  (Not in a hospital admission)  Current Regimen: None  Labs: HIV 1 RNA Quant (copies/mL)  Date Value  06/16/2012 12397*  01/20/2012 6983*  08/05/2011 5359*     CD4 T Cell Abs (cmm)  Date Value  06/16/2012 800   01/20/2012 940   08/05/2011 1100      Hepatitis B Surface Ag (no units)  Date Value  01/29/2011 NEGATIVE      HCV Ab (no units)  Date Value  01/29/2011 NEGATIVE     CrCl: The CrCl is unknown because both a height and weight (above a minimum accepted value) are required for this calculation.  Lipids:    Component Value Date/Time   CHOL 136 06/16/2012 1512   TRIG 133 06/16/2012 1512   HDL 39* 06/16/2012 1512   CHOLHDL 3.5  06/16/2012 1512   VLDL 27 06/16/2012 1512   LDLCALC 70 06/16/2012 1512    Assessment: 37 yo with a hx HIV but is therapy naive. He agreed to start therapy today with complera. I spent some time with him explaining side effects and compliance with him. He seems to understand how to take the meds appropriately. I told him to develop a routine so he can take it at the same time everyday. He is currently not on any other meds at this time.   Recommendations: Start complera 1 tab qday with meals  Clide Cliff, PharmD Clinical Infectious Disease Pharmacist [facility] 06/30/2012, 2:33 PM

## 2012-07-14 ENCOUNTER — Ambulatory Visit: Payer: 59

## 2012-07-18 ENCOUNTER — Telehealth: Payer: Self-pay | Admitting: *Deleted

## 2012-07-18 NOTE — Telephone Encounter (Signed)
Patient called and advised he needs to speak with Dr Daiva Eves or his nurse about his FMLA paperwork. Tried to help the patient and advised him that they are super busy today but he insist on speaking with her. Put the patient in her voicemail and advised him it may by tomorrow before she gets back to him.

## 2012-07-28 ENCOUNTER — Other Ambulatory Visit: Payer: 59

## 2012-08-11 ENCOUNTER — Ambulatory Visit: Payer: 59 | Admitting: Infectious Disease

## 2012-12-28 ENCOUNTER — Encounter: Payer: Self-pay | Admitting: Infectious Disease

## 2012-12-28 ENCOUNTER — Ambulatory Visit (INDEPENDENT_AMBULATORY_CARE_PROVIDER_SITE_OTHER): Payer: 59 | Admitting: Infectious Disease

## 2012-12-28 VITALS — BP 127/84 | HR 56 | Temp 98.1°F | Wt 161.0 lb

## 2012-12-28 DIAGNOSIS — Z21 Asymptomatic human immunodeficiency virus [HIV] infection status: Secondary | ICD-10-CM

## 2012-12-28 DIAGNOSIS — J069 Acute upper respiratory infection, unspecified: Secondary | ICD-10-CM

## 2012-12-28 DIAGNOSIS — Z7251 High risk heterosexual behavior: Secondary | ICD-10-CM

## 2012-12-28 DIAGNOSIS — B20 Human immunodeficiency virus [HIV] disease: Secondary | ICD-10-CM

## 2012-12-28 LAB — COMPLETE METABOLIC PANEL WITH GFR
Albumin: 4.3 g/dL (ref 3.5–5.2)
BUN: 12 mg/dL (ref 6–23)
CO2: 33 mEq/L — ABNORMAL HIGH (ref 19–32)
Calcium: 9.5 mg/dL (ref 8.4–10.5)
Chloride: 104 mEq/L (ref 96–112)
Creat: 1.27 mg/dL (ref 0.50–1.35)
GFR, Est African American: 84 mL/min
Glucose, Bld: 88 mg/dL (ref 70–99)
Potassium: 4.5 mEq/L (ref 3.5–5.3)

## 2012-12-28 LAB — CBC WITH DIFFERENTIAL/PLATELET
Basophils Absolute: 0 10*3/uL (ref 0.0–0.1)
Eosinophils Absolute: 0.1 10*3/uL (ref 0.0–0.7)
Lymphocytes Relative: 54 % — ABNORMAL HIGH (ref 12–46)
Lymphs Abs: 4.4 10*3/uL — ABNORMAL HIGH (ref 0.7–4.0)
MCH: 29.6 pg (ref 26.0–34.0)
Neutrophils Relative %: 39 % — ABNORMAL LOW (ref 43–77)
Platelets: 259 10*3/uL (ref 150–400)
RBC: 5.4 MIL/uL (ref 4.22–5.81)
RDW: 15.3 % (ref 11.5–15.5)
WBC: 8.2 10*3/uL (ref 4.0–10.5)

## 2012-12-28 LAB — RPR

## 2012-12-28 NOTE — Progress Notes (Signed)
  Subjective:    Patient ID: Kenneth Mason, male    DOB: Dec 23, 1975, 37 y.o.   MRN: [id number]  HPI  37 year old man with HIV, recently started on complera, started in January but NO recheck of VL since then. He states that he has been taking his complera faithfully with 400+ calorie meals and avoiding PPI, H2 blockers ,antacids.   He has suffered recent URI with sore throat, congestion that is gradually getting better now.  No fevers, nausea.   Review of Systems  Constitutional: Negative for fever, chills, diaphoresis, activity change, appetite change, fatigue and unexpected weight change.  HENT: Positive for sore throat, rhinorrhea and mouth sores. Negative for congestion, sneezing, trouble swallowing and sinus pressure.   Eyes: Negative for photophobia and visual disturbance.  Respiratory: Negative for cough, chest tightness, shortness of breath, wheezing and stridor.   Cardiovascular: Negative for chest pain, palpitations and leg swelling.  Gastrointestinal: Negative for nausea, vomiting, abdominal pain, diarrhea, constipation, blood in stool, abdominal distention and anal bleeding.  Genitourinary: Negative for dysuria, hematuria, flank pain and difficulty urinating.  Musculoskeletal: Negative for myalgias, back pain, joint swelling, arthralgias and gait problem.  Skin: Negative for color change, pallor, rash and wound.  Neurological: Negative for dizziness, tremors, weakness and light-headedness.  Hematological: Negative for adenopathy. Does not bruise/bleed easily.  Psychiatric/Behavioral: Negative for behavioral problems, confusion, sleep disturbance, dysphoric mood, decreased concentration and agitation.       Objective:   Physical Exam  Constitutional: He is oriented to person, place, and time. He appears well-developed and well-nourished. No distress.  HENT:  Head: Normocephalic and atraumatic.  Mouth/Throat: Posterior oropharyngeal edema and posterior oropharyngeal erythema  present. No oropharyngeal exudate or tonsillar abscesses.  Eyes: Conjunctivae and EOM are normal. Pupils are equal, round, and reactive to light. No scleral icterus.  Neck: Normal range of motion. Neck supple. No JVD present.  Cardiovascular: Normal rate, regular rhythm and normal heart sounds.  Exam reveals no gallop and no friction rub.   No murmur heard. Pulmonary/Chest: Effort normal and breath sounds normal. No respiratory distress. He has no wheezes. He has no rales. He exhibits no tenderness.  Abdominal: He exhibits no distension and no mass. There is no tenderness. There is no rebound and no guarding.  Musculoskeletal: He exhibits no edema and no tenderness.  Lymphadenopathy:    He has no cervical adenopathy.  Neurological: He is alert and oriented to person, place, and time. He has normal reflexes. He exhibits normal muscle tone. Coordination normal.  Skin: Skin is warm and dry. He is not diaphoretic. No erythema. No pallor.  Psychiatric: He has a normal mood and affect. His behavior is normal. Judgment and thought content normal.          Assessment & Plan:  HIV: recheck VL and CD4 count today  URI: continue symptomatic rx  High risk sexual behavior: has had sex with HIV + pts with and without condoms has NOT understood UNTIL today that he could STILL contract HIV from such partners IF their VL were not controlled

## 2012-12-30 LAB — T-HELPER CELL (CD4) - (RCID CLINIC ONLY): CD4 T Cell Abs: 1480 uL (ref 400–2700)

## 2012-12-30 LAB — HIV-1 RNA ULTRAQUANT REFLEX TO GENTYP+
HIV 1 RNA Quant: <20 copies/mL (ref <20)
HIV-1 RNA Quant, Log: <1.3 {Log} (ref <1.30)

## 2013-01-16 ENCOUNTER — Other Ambulatory Visit: Payer: Self-pay

## 2013-01-31 ENCOUNTER — Ambulatory Visit: Payer: Self-pay | Admitting: Infectious Disease

## 2013-02-28 ENCOUNTER — Ambulatory Visit: Payer: Self-pay | Admitting: Infectious Disease

## 2013-04-06 ENCOUNTER — Other Ambulatory Visit: Payer: Self-pay

## 2013-06-06 ENCOUNTER — Other Ambulatory Visit: Payer: 59

## 2013-06-06 DIAGNOSIS — B20 Human immunodeficiency virus [HIV] disease: Secondary | ICD-10-CM

## 2013-06-06 DIAGNOSIS — Z21 Asymptomatic human immunodeficiency virus [HIV] infection status: Secondary | ICD-10-CM

## 2013-06-06 LAB — CBC WITH DIFFERENTIAL/PLATELET
Basophils Absolute: 0 10*3/uL (ref 0.0–0.1)
Basophils Relative: 0 % (ref 0–1)
Eosinophils Absolute: 0.1 10*3/uL (ref 0.0–0.7)
Eosinophils Relative: 1 % (ref 0–5)
HCT: 48.5 % (ref 39.0–52.0)
HEMOGLOBIN: 17.2 g/dL — AB (ref 13.0–17.0)
LYMPHS ABS: 3.5 10*3/uL (ref 0.7–4.0)
Lymphocytes Relative: 51 % — ABNORMAL HIGH (ref 12–46)
MCH: 30.4 pg (ref 26.0–34.0)
MCHC: 35.5 g/dL (ref 30.0–36.0)
MCV: 85.7 fL (ref 78.0–100.0)
MONOS PCT: 6 % (ref 3–12)
Monocytes Absolute: 0.4 10*3/uL (ref 0.1–1.0)
NEUTROS ABS: 2.8 10*3/uL (ref 1.7–7.7)
NEUTROS PCT: 42 % — AB (ref 43–77)
Platelets: 216 10*3/uL (ref 150–400)
RBC: 5.66 MIL/uL (ref 4.22–5.81)
RDW: 14.9 % (ref 11.5–15.5)
WBC: 6.8 10*3/uL (ref 4.0–10.5)

## 2013-06-07 LAB — COMPLETE METABOLIC PANEL WITH GFR
ALT: 23 U/L (ref 0–53)
AST: 23 U/L (ref 0–37)
Albumin: 4.4 g/dL (ref 3.5–5.2)
Alkaline Phosphatase: 80 U/L (ref 39–117)
BILIRUBIN TOTAL: 0.7 mg/dL (ref 0.3–1.2)
BUN: 11 mg/dL (ref 6–23)
CO2: 33 meq/L — AB (ref 19–32)
Calcium: 9.8 mg/dL (ref 8.4–10.5)
Chloride: 103 mEq/L (ref 96–112)
Creat: 1.35 mg/dL (ref 0.50–1.35)
GFR, EST AFRICAN AMERICAN: 78 mL/min
GFR, EST NON AFRICAN AMERICAN: 67 mL/min
Glucose, Bld: 69 mg/dL — ABNORMAL LOW (ref 70–99)
Potassium: 4.2 mEq/L (ref 3.5–5.3)
Sodium: 143 mEq/L (ref 135–145)
Total Protein: 7 g/dL (ref 6.0–8.3)

## 2013-06-07 LAB — HIV-1 RNA QUANT-NO REFLEX-BLD: HIV-1 RNA Quant, Log: <1.3 {Log} (ref <1.30)

## 2013-06-08 LAB — T-HELPER CELL (CD4) - (RCID CLINIC ONLY)
CD4 T CELL ABS: 1190 /uL (ref 400–2700)
CD4 T CELL HELPER: 32 % — AB (ref 33–55)

## 2013-06-19 ENCOUNTER — Other Ambulatory Visit: Payer: Self-pay | Admitting: Infectious Disease

## 2013-06-20 ENCOUNTER — Ambulatory Visit (INDEPENDENT_AMBULATORY_CARE_PROVIDER_SITE_OTHER): Payer: 59 | Admitting: Infectious Disease

## 2013-06-20 ENCOUNTER — Encounter: Payer: Self-pay | Admitting: Infectious Disease

## 2013-06-20 VITALS — BP 114/73 | HR 65 | Temp 98.0°F | Wt 163.0 lb

## 2013-06-20 DIAGNOSIS — B20 Human immunodeficiency virus [HIV] disease: Secondary | ICD-10-CM

## 2013-06-20 DIAGNOSIS — Z21 Asymptomatic human immunodeficiency virus [HIV] infection status: Secondary | ICD-10-CM

## 2013-06-20 DIAGNOSIS — F32A Depression, unspecified: Secondary | ICD-10-CM

## 2013-06-20 DIAGNOSIS — Z23 Encounter for immunization: Secondary | ICD-10-CM

## 2013-06-20 DIAGNOSIS — F329 Major depressive disorder, single episode, unspecified: Secondary | ICD-10-CM

## 2013-06-20 DIAGNOSIS — R198 Other specified symptoms and signs involving the digestive system and abdomen: Secondary | ICD-10-CM | POA: Insufficient documentation

## 2013-06-20 DIAGNOSIS — F3289 Other specified depressive episodes: Secondary | ICD-10-CM

## 2013-06-20 DIAGNOSIS — Z113 Encounter for screening for infections with a predominantly sexual mode of transmission: Secondary | ICD-10-CM

## 2013-06-20 MED ORDER — DOXYCYCLINE HYCLATE 100 MG PO TABS: 100.0000 mg | ORAL_TABLET | Freq: Two times a day (BID) | ORAL | Status: DC

## 2013-06-20 MED ORDER — EMTRICITAB-RILPIVIR-TENOFOV DF 200-25-300 MG PO TABS: 30.0000 | ORAL_TABLET | Freq: Every day | ORAL | Status: DC

## 2013-06-20 NOTE — Progress Notes (Signed)
Subjective:    Patient ID: Kenneth Mason, male    DOB: 08-05-1975, 38 y.o.   MRN: [id number]  HPI   38 year old man with HIV, recently started on complera, started in January 2014 with perfect virological suppresion and healthy CD4.    He states that he has been taking his complera faithfully with 400+ calorie meals and avoiding PPI, H2 blockers ,antacids.   His depression is better with him in counseling. He is not taking celexa or klonopin anymore  He HAS been suffering from rectal discharge with fishy odor x 6 months and had not sought treatment.   He has had unprotected receptive intercourse > 6 months ago. His GC and chlamydia from urine negative.   He did have endoscopic microsurgery within anal canal 2 years ago.  Review of Systems  Constitutional: Negative for fever, chills, diaphoresis, activity change, appetite change, fatigue and unexpected weight change.  HENT: Negative for congestion, mouth sores, rhinorrhea, sinus pressure, sneezing, sore throat and trouble swallowing.   Eyes: Negative for photophobia and visual disturbance.  Respiratory: Negative for cough, chest tightness, shortness of breath, wheezing and stridor.   Cardiovascular: Negative for chest pain, palpitations and leg swelling.  Gastrointestinal: Positive for rectal pain. Negative for nausea, vomiting, abdominal pain, diarrhea, constipation, blood in stool, abdominal distention and anal bleeding.  Genitourinary: Negative for dysuria, hematuria, flank pain, discharge, penile swelling, scrotal swelling, difficulty urinating, genital sores, penile pain and testicular pain.  Musculoskeletal: Negative for arthralgias, back pain, gait problem, joint swelling and myalgias.  Skin: Negative for color change, pallor, rash and wound.  Neurological: Negative for dizziness, tremors, weakness and light-headedness.  Hematological: Negative for adenopathy. Does not bruise/bleed easily.  Psychiatric/Behavioral: Negative for  behavioral problems, confusion, sleep disturbance, dysphoric mood, decreased concentration and agitation.       Objective:   Physical Exam  Constitutional: He is oriented to person, place, and time. He appears well-developed and well-nourished. No distress.  HENT:  Head: Normocephalic and atraumatic.  Mouth/Throat: Posterior oropharyngeal edema and posterior oropharyngeal erythema present. No oropharyngeal exudate or tonsillar abscesses.  Eyes: Conjunctivae and EOM are normal. Pupils are equal, round, and reactive to light. No scleral icterus.  Neck: Normal range of motion. Neck supple. No JVD present.  Cardiovascular: Normal rate, regular rhythm and normal heart sounds.  Exam reveals no gallop and no friction rub.   No murmur heard. Pulmonary/Chest: Effort normal and breath sounds normal. No respiratory distress. He has no wheezes. He has no rales. He exhibits no tenderness.  Abdominal: He exhibits no distension and no mass. There is no tenderness. There is no rebound and no guarding.  Genitourinary: Rectal exam shows external hemorrhoid. Rectal exam shows anal tone normal. Prostate is not enlarged.     Musculoskeletal: He exhibits no edema and no tenderness.  Lymphadenopathy:    He has no cervical adenopathy.  Neurological: He is alert and oriented to person, place, and time. He has normal reflexes. He exhibits normal muscle tone. Coordination normal.  Skin: Skin is warm and dry. He is not diaphoretic. No erythema. No pallor.  Psychiatric: He has a normal mood and affect. His behavior is normal. Judgment and thought content normal.          Assessment & Plan:  HIV: doing great continue current therapy  Rectal dc: I checked GC and chlamydia from rectum. I worry if he might have developed a fistula and certainly I think would be wise to have him see [facility] but he  does not want to go until we have first tried abx  I spent greater than 40 minutes with the patient including greater  than 50% of time in face to face counsel of the patient and in coordination of their care.  I am giving empiric doxy x 21 days first and fu GC chlamydia test  Depression: doing well in therapy

## 2013-06-21 LAB — GC/CHLAMYDIA PROBE AMP
CT PROBE, AMP APTIMA: NEGATIVE
GC Probe RNA: NEGATIVE

## 2013-07-21 ENCOUNTER — Ambulatory Visit: Payer: 59 | Admitting: Infectious Disease

## 2013-12-05 ENCOUNTER — Other Ambulatory Visit: Payer: 59

## 2013-12-12 ENCOUNTER — Other Ambulatory Visit (INDEPENDENT_AMBULATORY_CARE_PROVIDER_SITE_OTHER): Payer: 59

## 2013-12-12 DIAGNOSIS — B20 Human immunodeficiency virus [HIV] disease: Secondary | ICD-10-CM

## 2013-12-12 LAB — CBC WITH DIFFERENTIAL/PLATELET
Basophils Absolute: 0 10*3/uL (ref 0.0–0.1)
Basophils Relative: 0 % (ref 0–1)
Eosinophils Absolute: 0.1 10*3/uL (ref 0.0–0.7)
Eosinophils Relative: 2 % (ref 0–5)
HEMATOCRIT: 43.3 % (ref 39.0–52.0)
HEMOGLOBIN: 15.6 g/dL (ref 13.0–17.0)
LYMPHS ABS: 4.2 10*3/uL — AB (ref 0.7–4.0)
Lymphocytes Relative: 64 % — ABNORMAL HIGH (ref 12–46)
MCH: 29.8 pg (ref 26.0–34.0)
MCHC: 36 g/dL (ref 30.0–36.0)
MCV: 82.6 fL (ref 78.0–100.0)
MONO ABS: 0.3 10*3/uL (ref 0.1–1.0)
MONOS PCT: 5 % (ref 3–12)
NEUTROS ABS: 1.9 10*3/uL (ref 1.7–7.7)
NEUTROS PCT: 29 % — AB (ref 43–77)
Platelets: 208 10*3/uL (ref 150–400)
RBC: 5.24 MIL/uL (ref 4.22–5.81)
RDW: 15.1 % (ref 11.5–15.5)
WBC: 6.5 10*3/uL (ref 4.0–10.5)

## 2013-12-13 LAB — LIPID PANEL
CHOLESTEROL: 140 mg/dL (ref 0–200)
HDL: 35 mg/dL — AB (ref >39)
LDL CALC: 89 mg/dL (ref 0–99)
TRIGLYCERIDES: 82 mg/dL (ref <150)
Total CHOL/HDL Ratio: 4 Ratio
VLDL: 16 mg/dL (ref 0–40)

## 2013-12-13 LAB — COMPLETE METABOLIC PANEL WITH GFR
ALK PHOS: 68 U/L (ref 39–117)
ALT: 16 U/L (ref 0–53)
AST: 21 U/L (ref 0–37)
Albumin: 4.1 g/dL (ref 3.5–5.2)
BUN: 13 mg/dL (ref 6–23)
CO2: 30 mEq/L (ref 19–32)
CREATININE: 1.3 mg/dL (ref 0.50–1.35)
Calcium: 9.1 mg/dL (ref 8.4–10.5)
Chloride: 103 mEq/L (ref 96–112)
GFR, Est African American: 81 mL/min
GFR, Est Non African American: 70 mL/min
Glucose, Bld: 67 mg/dL — ABNORMAL LOW (ref 70–99)
Potassium: 4.1 mEq/L (ref 3.5–5.3)
Sodium: 140 mEq/L (ref 135–145)
Total Bilirubin: 0.4 mg/dL (ref 0.2–1.2)
Total Protein: 6.4 g/dL (ref 6.0–8.3)

## 2013-12-13 LAB — T-HELPER CELL (CD4) - (RCID CLINIC ONLY)
CD4 % Helper T Cell: 31 % — ABNORMAL LOW (ref 33–55)
CD4 T Cell Abs: 1410 /uL (ref 400–2700)

## 2013-12-13 LAB — MICROALBUMIN / CREATININE URINE RATIO
Creatinine, Urine: 200.8 mg/dL
MICROALB UR: 0.5 mg/dL (ref 0.00–1.89)
Microalb Creat Ratio: 2.5 mg/g (ref 0.0–30.0)

## 2013-12-13 LAB — HEPATITIS C ANTIBODY: HCV Ab: NEGATIVE

## 2013-12-14 LAB — HIV-1 RNA QUANT-NO REFLEX-BLD: HIV-1 RNA Quant, Log: <1.3 {Log} (ref <1.30)

## 2014-01-01 ENCOUNTER — Encounter: Payer: Self-pay | Admitting: Infectious Disease

## 2014-01-01 ENCOUNTER — Ambulatory Visit (INDEPENDENT_AMBULATORY_CARE_PROVIDER_SITE_OTHER): Payer: 59 | Admitting: Infectious Disease

## 2014-01-01 VITALS — BP 120/93 | HR 81 | Temp 98.3°F | Wt 162.5 lb

## 2014-01-01 DIAGNOSIS — B20 Human immunodeficiency virus [HIV] disease: Secondary | ICD-10-CM

## 2014-01-01 DIAGNOSIS — Z21 Asymptomatic human immunodeficiency virus [HIV] infection status: Secondary | ICD-10-CM

## 2014-01-01 NOTE — Progress Notes (Signed)
Subjective:    Patient ID: Kenneth Mason, male    DOB: 10-May-1976, 38 y.o.   MRN: [id number]  HPI   38 year old man with HIV, recently started on complera, started in January 2014 with perfect virological suppresion and healthy CD4.   Lab Results  Component Value Date   HIV1RNAQUANT <20 12/12/2013   Lab Results  Component Value Date   CD4TABS 1410 12/12/2013   CD4TABS 1190 06/06/2013   CD4TABS 1480 12/28/2012      He states that he has been taking his complera faithfully with 400+ calorie meals and avoiding PPI, H2 blockers ,antacids.   His depression is better with him in counseling. He is not taking celexa or klonopin anymore  He states that he is tolerating COMPLERA quite well and then only occasionally has had symptoms of feeling moody. He has these symptoms typically if he misses a dose which he states happens only once or twice a year and then resumes his Complera.  Review of Systems  Constitutional: Negative for fever, chills, diaphoresis, activity change, appetite change, fatigue and unexpected weight change.  HENT: Negative for congestion, mouth sores, rhinorrhea, sinus pressure, sneezing, sore throat and trouble swallowing.   Eyes: Negative for photophobia and visual disturbance.  Respiratory: Negative for cough, chest tightness, shortness of breath, wheezing and stridor.   Cardiovascular: Negative for chest pain, palpitations and leg swelling.  Gastrointestinal: Negative for nausea, vomiting, abdominal pain, diarrhea, constipation, blood in stool, abdominal distention and anal bleeding.  Genitourinary: Negative for dysuria, hematuria, flank pain, discharge, penile swelling, scrotal swelling, difficulty urinating, genital sores, penile pain and testicular pain.  Musculoskeletal: Negative for arthralgias, back pain, gait problem, joint swelling and myalgias.  Skin: Negative for color change, pallor, rash and wound.  Neurological: Negative for dizziness, tremors, weakness and  light-headedness.  Hematological: Negative for adenopathy. Does not bruise/bleed easily.  Psychiatric/Behavioral: Negative for behavioral problems, confusion, sleep disturbance, dysphoric mood, decreased concentration and agitation.       Objective:   Physical Exam  Constitutional: He is oriented to person, place, and time. He appears well-developed and well-nourished. No distress.  HENT:  Head: Normocephalic and atraumatic.  Mouth/Throat: No oropharyngeal exudate or tonsillar abscesses.  Eyes: Conjunctivae and EOM are normal. Pupils are equal, round, and reactive to light. No scleral icterus.  Neck: Normal range of motion. Neck supple. No JVD present.  Cardiovascular: Normal rate, regular rhythm and normal heart sounds.  Exam reveals no gallop and no friction rub.   No murmur heard. Pulmonary/Chest: Effort normal and breath sounds normal. No respiratory distress. He has no wheezes. He has no rales. He exhibits no tenderness.  Abdominal: He exhibits no distension and no mass. There is no tenderness. There is no rebound and no guarding.  Genitourinary: Rectal exam shows anal tone normal. Prostate is not enlarged.  Musculoskeletal: He exhibits no edema and no tenderness.  Lymphadenopathy:    He has no cervical adenopathy.  Neurological: He is alert and oriented to person, place, and time. He has normal reflexes. He exhibits normal muscle tone. Coordination normal.  Skin: Skin is warm and dry. He is not diaphoretic. No erythema. No pallor.  Psychiatric: He has a normal mood and affect. His behavior is normal. Judgment and thought content normal.          Assessment & Plan:  HIV: doing great continue current therapy. If still with trouble with the moodiness we can consider changing him to [facility].  His HIV + partner is also  with undetectable viral load

## 2014-04-11 ENCOUNTER — Other Ambulatory Visit: Payer: 59

## 2014-04-24 ENCOUNTER — Ambulatory Visit: Payer: 59 | Admitting: Infectious Disease

## 2014-05-14 ENCOUNTER — Other Ambulatory Visit: Payer: 59

## 2014-05-14 DIAGNOSIS — Z79899 Other long term (current) drug therapy: Secondary | ICD-10-CM

## 2014-05-14 DIAGNOSIS — B20 Human immunodeficiency virus [HIV] disease: Secondary | ICD-10-CM

## 2014-05-14 DIAGNOSIS — Z113 Encounter for screening for infections with a predominantly sexual mode of transmission: Secondary | ICD-10-CM

## 2014-05-14 LAB — CBC WITH DIFFERENTIAL/PLATELET
BASOS ABS: 0 10*3/uL (ref 0.0–0.1)
Basophils Relative: 0 % (ref 0–1)
Eosinophils Absolute: 0.1 10*3/uL (ref 0.0–0.7)
Eosinophils Relative: 1 % (ref 0–5)
HCT: 43.4 % (ref 39.0–52.0)
Hemoglobin: 15.3 g/dL (ref 13.0–17.0)
Lymphocytes Relative: 55 % — ABNORMAL HIGH (ref 12–46)
Lymphs Abs: 3.7 10*3/uL (ref 0.7–4.0)
MCH: 29.5 pg (ref 26.0–34.0)
MCHC: 35.3 g/dL (ref 30.0–36.0)
MCV: 83.6 fL (ref 78.0–100.0)
MONO ABS: 0.5 10*3/uL (ref 0.1–1.0)
MPV: 8.9 fL — ABNORMAL LOW (ref 9.4–12.4)
Monocytes Relative: 7 % (ref 3–12)
NEUTROS ABS: 2.5 10*3/uL (ref 1.7–7.7)
Neutrophils Relative %: 37 % — ABNORMAL LOW (ref 43–77)
PLATELETS: 223 10*3/uL (ref 150–400)
RBC: 5.19 MIL/uL (ref 4.22–5.81)
RDW: 14.5 % (ref 11.5–15.5)
WBC: 6.8 10*3/uL (ref 4.0–10.5)

## 2014-05-14 LAB — COMPLETE METABOLIC PANEL WITH GFR
ALBUMIN: 4.2 g/dL (ref 3.5–5.2)
ALK PHOS: 77 U/L (ref 39–117)
ALT: 19 U/L (ref 0–53)
AST: 17 U/L (ref 0–37)
BUN: 14 mg/dL (ref 6–23)
CALCIUM: 9.5 mg/dL (ref 8.4–10.5)
CO2: 27 mEq/L (ref 19–32)
Chloride: 103 mEq/L (ref 96–112)
Creat: 1.21 mg/dL (ref 0.50–1.35)
GFR, Est African American: 89 mL/min
GFR, Est Non African American: 77 mL/min
Glucose, Bld: 85 mg/dL (ref 70–99)
POTASSIUM: 4.2 meq/L (ref 3.5–5.3)
Sodium: 140 mEq/L (ref 135–145)
Total Bilirubin: 0.8 mg/dL (ref 0.2–1.2)
Total Protein: 6.8 g/dL (ref 6.0–8.3)

## 2014-05-14 LAB — LIPID PANEL
CHOLESTEROL: 133 mg/dL (ref 0–200)
HDL: 45 mg/dL (ref >39)
LDL CALC: 78 mg/dL (ref 0–99)
TRIGLYCERIDES: 49 mg/dL (ref <150)
Total CHOL/HDL Ratio: 3 Ratio
VLDL: 10 mg/dL (ref 0–40)

## 2014-05-15 LAB — HIV-1 RNA QUANT-NO REFLEX-BLD: HIV-1 RNA Quant, Log: <1.3 {Log} (ref <1.30)

## 2014-05-15 LAB — T-HELPER CELL (CD4) - (RCID CLINIC ONLY)
CD4 % Helper T Cell: 34 % (ref 33–55)
CD4 T Cell Abs: 1400 /uL (ref 400–2700)

## 2014-05-15 LAB — RPR

## 2014-05-15 LAB — URINE CYTOLOGY ANCILLARY ONLY
CHLAMYDIA, DNA PROBE: NEGATIVE
Neisseria Gonorrhea: NEGATIVE

## 2014-05-17 ENCOUNTER — Other Ambulatory Visit: Payer: Self-pay | Admitting: Infectious Disease

## 2014-05-17 DIAGNOSIS — B20 Human immunodeficiency virus [HIV] disease: Secondary | ICD-10-CM

## 2014-05-22 ENCOUNTER — Encounter: Payer: Self-pay | Admitting: Infectious Disease

## 2014-05-22 ENCOUNTER — Ambulatory Visit (INDEPENDENT_AMBULATORY_CARE_PROVIDER_SITE_OTHER): Payer: 59 | Admitting: Infectious Disease

## 2014-05-22 VITALS — BP 111/71 | HR 64 | Temp 98.1°F | Wt 159.0 lb

## 2014-05-22 DIAGNOSIS — B20 Human immunodeficiency virus [HIV] disease: Secondary | ICD-10-CM

## 2014-05-22 DIAGNOSIS — F329 Major depressive disorder, single episode, unspecified: Secondary | ICD-10-CM

## 2014-05-22 DIAGNOSIS — A630 Anogenital (venereal) warts: Secondary | ICD-10-CM

## 2014-05-22 DIAGNOSIS — Z21 Asymptomatic human immunodeficiency virus [HIV] infection status: Secondary | ICD-10-CM

## 2014-05-22 DIAGNOSIS — F32A Depression, unspecified: Secondary | ICD-10-CM

## 2014-05-22 NOTE — Progress Notes (Signed)
  Subjective:    Patient ID: Kenneth Mason, male    DOB: April 12, 1976, 38 y.o.   MRN: [id number]  HPI   38 year old man with HIV, recently started on complera, started in January 2014 with perfect virological suppresion and healthy CD4.   Lab Results  Component Value Date   HIV1RNAQUANT <20 05/14/2014   Lab Results  Component Value Date   CD4TABS 1400 05/14/2014   CD4TABS 1410 12/12/2013   CD4TABS 1190 06/06/2013     He states that he has been taking his complera faithfully with 400+ calorie meals and avoiding PPI, H2 blockers ,antacids.   He has had been seen by General surgery, Dr. Johney Maine who removed a benign rectal cyst.   He now has developed what he thinks is a possible  wart in his rectum.  Review of Systems  Constitutional: Negative for fever, chills, diaphoresis, activity change, appetite change, fatigue and unexpected weight change.  HENT: Negative for congestion, mouth sores, rhinorrhea, sinus pressure, sneezing, sore throat and trouble swallowing.   Eyes: Negative for photophobia and visual disturbance.  Respiratory: Negative for cough, chest tightness, shortness of breath, wheezing and stridor.   Cardiovascular: Negative for chest pain, palpitations and leg swelling.  Gastrointestinal: Positive for rectal pain. Negative for nausea, vomiting, abdominal pain, diarrhea, constipation, blood in stool, abdominal distention and anal bleeding.  Genitourinary: Negative for dysuria, hematuria, flank pain, discharge, penile swelling, scrotal swelling, difficulty urinating, genital sores, penile pain and testicular pain.  Musculoskeletal: Negative for myalgias, back pain, joint swelling, arthralgias and gait problem.  Skin: Negative for color change, pallor, rash and wound.  Neurological: Negative for dizziness, tremors, weakness and light-headedness.  Hematological: Negative for adenopathy. Does not bruise/bleed easily.  Psychiatric/Behavioral: Negative for behavioral problems,  confusion, sleep disturbance, dysphoric mood, decreased concentration and agitation.       Objective:   Physical Exam  Constitutional: He is oriented to person, place, and time. He appears well-developed and well-nourished.  HENT:  Head: Normocephalic and atraumatic.  Eyes: Conjunctivae and EOM are normal.  Neck: Normal range of motion. Neck supple.  Cardiovascular: Normal rate and regular rhythm.   Pulmonary/Chest: Effort normal. No respiratory distress. He has no wheezes.  Abdominal: Soft. He exhibits no distension.  Genitourinary: Rectal exam shows tenderness.  Musculoskeletal: Normal range of motion. He exhibits no edema or tenderness.  Neurological: He is alert and oriented to person, place, and time.  Skin: Skin is warm and dry. No rash noted. No erythema. No pallor.  Psychiatric: He has a normal mood and affect. His behavior is normal. Judgment and thought content normal.   Appears to have genital wart with adjacent hemorrhoid  I performed swab for cytology for anal pap smear but this was sent in wrong container           Assessment & Plan:  HIV: doing great continue current therapy.     Rectal lesion: will have him see Dr Johney Maine again. I put in referral and will send this note to Dr Johney Maine.   Depression: better

## 2014-11-07 ENCOUNTER — Other Ambulatory Visit: Payer: 59

## 2014-11-07 ENCOUNTER — Emergency Department (HOSPITAL_COMMUNITY)
Admission: EM | Admit: 2014-11-07 | Discharge: 2014-11-07 | Disposition: A | Discharge status: Home / Self Care | Payer: 59 | Attending: Emergency Medicine | Admitting: Emergency Medicine

## 2014-11-07 ENCOUNTER — Encounter (HOSPITAL_COMMUNITY): Payer: Self-pay | Admitting: *Deleted

## 2014-11-07 DIAGNOSIS — Z8659 Personal history of other mental and behavioral disorders: Secondary | ICD-10-CM | POA: Insufficient documentation

## 2014-11-07 DIAGNOSIS — Z8719 Personal history of other diseases of the digestive system: Secondary | ICD-10-CM | POA: Insufficient documentation

## 2014-11-07 DIAGNOSIS — A630 Anogenital (venereal) warts: Secondary | ICD-10-CM

## 2014-11-07 DIAGNOSIS — Z21 Asymptomatic human immunodeficiency virus [HIV] infection status: Secondary | ICD-10-CM | POA: Insufficient documentation

## 2014-11-07 DIAGNOSIS — J069 Acute upper respiratory infection, unspecified: Secondary | ICD-10-CM | POA: Insufficient documentation

## 2014-11-07 DIAGNOSIS — R05 Cough: Secondary | ICD-10-CM | POA: Diagnosis present

## 2014-11-07 DIAGNOSIS — B9789 Other viral agents as the cause of diseases classified elsewhere: Secondary | ICD-10-CM

## 2014-11-07 LAB — CBC WITH DIFFERENTIAL/PLATELET
Basophils Absolute: 0 10*3/uL (ref 0.0–0.1)
Basophils Relative: 0 % (ref 0–1)
EOS ABS: 0.2 10*3/uL (ref 0.0–0.7)
Eosinophils Relative: 2 % (ref 0–5)
HCT: 46 % (ref 39.0–52.0)
Hemoglobin: 16.3 g/dL (ref 13.0–17.0)
Lymphocytes Relative: 25 % (ref 12–46)
Lymphs Abs: 2.2 10*3/uL (ref 0.7–4.0)
MCH: 29.5 pg (ref 26.0–34.0)
MCHC: 35.4 g/dL (ref 30.0–36.0)
MCV: 83.3 fL (ref 78.0–100.0)
MPV: 8.8 fL (ref 8.6–12.4)
Monocytes Absolute: 0.5 10*3/uL (ref 0.1–1.0)
Monocytes Relative: 6 % (ref 3–12)
NEUTROS ABS: 5.9 10*3/uL (ref 1.7–7.7)
Neutrophils Relative %: 67 % (ref 43–77)
Platelets: 212 10*3/uL (ref 150–400)
RBC: 5.52 MIL/uL (ref 4.22–5.81)
RDW: 14.6 % (ref 11.5–15.5)
WBC: 8.8 10*3/uL (ref 4.0–10.5)

## 2014-11-07 LAB — COMPLETE METABOLIC PANEL WITH GFR
ALT: 16 U/L (ref 0–53)
AST: 19 U/L (ref 0–37)
Albumin: 4.2 g/dL (ref 3.5–5.2)
Alkaline Phosphatase: 81 U/L (ref 39–117)
BUN: 9 mg/dL (ref 6–23)
CALCIUM: 9.4 mg/dL (ref 8.4–10.5)
CHLORIDE: 103 meq/L (ref 96–112)
CO2: 31 mEq/L (ref 19–32)
Creat: 1.22 mg/dL (ref 0.50–1.35)
GFR, EST NON AFRICAN AMERICAN: 75 mL/min
GFR, Est African American: 87 mL/min
GLUCOSE: 90 mg/dL (ref 70–99)
Potassium: 4.1 mEq/L (ref 3.5–5.3)
Sodium: 142 mEq/L (ref 135–145)
TOTAL PROTEIN: 6.8 g/dL (ref 6.0–8.3)
Total Bilirubin: 0.7 mg/dL (ref 0.2–1.2)

## 2014-11-07 MED ORDER — PREDNISONE 20 MG PO TABS: 40.0000 mg | ORAL_TABLET | Freq: Every day | ORAL | Status: DC

## 2014-11-07 MED ORDER — FLUTICASONE PROPIONATE 50 MCG/ACT NA SUSP: 2.0000 | Freq: Every day | NASAL | Status: DC

## 2014-11-07 MED ORDER — ALBUTEROL SULFATE HFA 108 (90 BASE) MCG/ACT IN AERS
2.0000 | INHALATION_SPRAY | Freq: Once | RESPIRATORY_TRACT | Status: AC
  Administered 2014-11-07: 2 via RESPIRATORY_TRACT
  Filled 2014-11-07: qty 6.7

## 2014-11-07 MED ORDER — AEROCHAMBER PLUS FLO-VU LARGE MISC
1.0000 | Freq: Once | Status: AC
  Administered 2014-11-07: 1
  Filled 2014-11-07 (×2): qty 1

## 2014-11-07 MED ORDER — HYDROCODONE-HOMATROPINE 5-1.5 MG/5ML PO SYRP: 5.0000 mL | ORAL_SOLUTION | Freq: Every evening | ORAL | Status: DC | PRN

## 2014-11-07 MED ORDER — BENZONATATE 100 MG PO CAPS: 100.0000 mg | ORAL_CAPSULE | Freq: Three times a day (TID) | ORAL | Status: DC

## 2014-11-07 NOTE — ED Notes (Signed)
This RN called pharmacy to determine status of the aerochamber and was told they will tube to fast track now.

## 2014-11-07 NOTE — ED Notes (Signed)
The pt has a cough for 7 days he just wants  To have an antibiotic

## 2014-11-07 NOTE — ED Provider Notes (Signed)
CSN: [id number]     Arrival date & time 11/07/14  1954 History   First MD Initiated Contact with Patient 11/07/14 2022     Chief Complaint  Patient presents with  . Cough    (Consider location/radiation/quality/duration/timing/severity/associated sxs/prior Treatment) HPI Comments: 39 year old male with a history of HIV, last CD4 count 1400, presents to the emergency department for further evaluation of cough times one week. Patient states that symptoms began shortly after returning from [place]. His friend, who he went with, came back with similar symptoms as well. Symptoms associated with nasal congestion and mild sore throat when coughing. He states that his cough is dry and nonproductive. He has been taking Mucinex and DayQuil as well as drinking hot tea without relief of his symptoms. Patient denies any fever, shortness of breath, wheezing, and inability to swallow or drooling.  Patient is a 39 y.o. male presenting with cough. The history is provided by the patient. No language interpreter was used.  Cough Associated symptoms: no rhinorrhea     Past Medical History  Diagnosis Date  . Headache in front of head     q 2 weeks. No n/v, no photophobia. Responds to NSAIDs  . Rectal polyp 1999    polyp excised  . Anxiety   . HIV infection     dx'd HIV July 12; CD4 1100.  Presented with lymphadenopathy  . Depression   . 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 Dental surgery  2010    Wisdom Teeth   . Eus  04/09/2011    Procedure: LOWER ENDOSCOPIC ULTRASOUND (EUS);  Surgeon: Owens Loffler, MD;  Location: [facility];  Service: Endoscopy;  Laterality: N/A;  . Flexible sigmoidoscopy  04/09/2011    Procedure: FLEXIBLE SIGMOIDOSCOPY;  Surgeon: Owens Loffler, MD;  Location: [facility];  Service: Endoscopy;  Laterality: N/A;  . Transanal endoscopic microsurgery  06/12/2011    Procedure: TRANSANAL  ENDOSCOPIC MICROSURGERY;  Surgeon: Adin Hector, MD;  Location: [facility];  Service: General;  Laterality: N/A;  partial proctectomy by TEM   Family History  Problem Relation Age of Onset  . Hypertension Mother   . Cancer Maternal Grandfather     unsure of type   . Liver cancer Paternal Grandmother   . Stroke Paternal Grandmother   . Heart disease Paternal Grandfather     CAD/MI-fatal   History  Substance Use Topics  . Smoking status: Never Smoker   . Smokeless tobacco: Never Used  . Alcohol Use: Yes     Comment: rarely - once a month     Review of Systems  Constitutional: Negative for fatigue.  HENT: Positive for congestion and postnasal drip. Negative for rhinorrhea.   Respiratory: Positive for cough and chest tightness.   Gastrointestinal: Negative for nausea and vomiting.  Neurological: Negative for syncope.  All other systems reviewed and are negative.   Allergies  Review of patient's allergies indicates no known allergies.  Home Medications   Prior to Admission medications   Medication Sig Start Date End Date Taking? Authorizing Provider  benzonatate (TESSALON) 100 MG capsule Take 1 capsule (100 mg total) by mouth every 8 (eight) hours. As needed for cough 11/07/14   Antonietta Breach, PA-C  COMPLERA 200-25-300 MG TABS TAKE 1 TABLET BY MOUTH EVERY DAY -TAKE WITH FOOD 05/17/14   Truman Hayward, MD  fluticasone [facility]) 50 MCG/ACT nasal spray Place 2 sprays  into both nostrils daily. 11/07/14   Antonietta Breach, PA-C  HYDROcodone-homatropine [facility]) 5-1.5 MG/5ML syrup Take 5 mLs by mouth at bedtime as needed for cough (You may take up to 4 times a day, every 6 hours). 11/07/14   Antonietta Breach, PA-C  predniSONE (DELTASONE) 20 MG tablet Take 2 tablets (40 mg total) by mouth daily. Take 40 mg by mouth daily for 3 days, then 20mg  by mouth daily for 3 days, then 10mg  daily for 3 days 11/07/14   Antonietta Breach, PA-C   BP 110/93 mmHg  Pulse 92  Temp(Src) 98.1 F (36.7 C)  Resp 16  Ht 5'  10" (1.778 m)  Wt 155 lb 2 oz (70.364 kg)  BMI 22.26 kg/m2  SpO2 99%   Physical Exam  Constitutional: He is oriented to person, place, and time. He appears well-developed and well-nourished. No distress.  Nontoxic/nonseptic appearing  HENT:  Head: Normocephalic and atraumatic.  Right Ear: Tympanic membrane, external ear and ear canal normal.  Left Ear: Tympanic membrane, external ear and ear canal normal.  Mouth/Throat: Uvula is midline and oropharynx is clear and moist. No oropharyngeal exudate.  Audible nasal congestion with mild mucosal edema. Oropharynx clear without edema or erythema. Uvula midline. Patient tolerating secretions without difficulty.  Eyes: Conjunctivae and EOM are normal. No scleral icterus.  Neck: Normal range of motion.  No nuchal rigidity or meningismus  Cardiovascular: Normal rate, regular rhythm and intact distal pulses.   Pulmonary/Chest: Effort normal and breath sounds normal. No respiratory distress. He has no wheezes. He has no rales.  Respirations even and unlabored. No wheezing or rales.  Musculoskeletal: Normal range of motion.  Neurological: He is alert and oriented to person, place, and time. He exhibits normal muscle tone. Coordination normal.  Skin: Skin is warm and dry. No rash noted. He is not diaphoretic. No erythema. No pallor.  Psychiatric: He has a normal mood and affect. His behavior is normal.  Nursing note and vitals reviewed.   ED Course  Procedures (including critical care time) Labs Review Labs Reviewed - No data to display  Imaging Review No results found.   EKG Interpretation None      MDM   Final diagnoses:  Viral URI with cough    Patient with hx of HIV (CD4 count 1400) presents for cough and nasal congestion. Patients symptoms are consistent with URI, likely viral etiology. Discussed that antibiotics are not indicated for viral infections. Doubt PNA given lack of fever, tachypnea, dyspnea, or hypoxia; no indication  for further emergent work up or imaging. Pt will be discharged with symptomatic treatment.  Patient verbalizes understanding and is agreeable with plan. Patient discharged in good with no unaddressed concerns.   Filed Vitals:   11/07/14 2000 11/07/14 2042  BP: 117/64 110/93  Pulse: 77 92  Temp: 98.1 F (36.7 C)   Resp: 16 16  Height: 5\' 10"  (1.778 m)   Weight: 155 lb 2 oz (70.364 kg)   SpO2: 97% 99%        Antonietta Breach, PA-C 11/07/14 2108  Dorie Rank, MD 11/07/14 2202

## 2014-11-07 NOTE — ED Notes (Signed)
Pt A&OX4, ambulatory at d/c with steady gait, NAD 

## 2014-11-07 NOTE — ED Notes (Signed)
Pt sts he went to [place] last week and he and the friend he went with got colds.  Pt sts his is not resolving.  Denies any fevers.  Sts he cannot get rid of the cough or congestion and this has "lasted longer than I'm used to for a cold"

## 2014-11-07 NOTE — Discharge Instructions (Signed)
Take Prednisone and Tessalon as prescribed. You may use Flonase or nasal saline spray (found over the counter) for nasal congestion. Use Hycodan at night for cough. Take 2 puffs of an albuterol inhaler every 4-6 hours as needed for cough and chest tightness. Follow up with your primary care doctor.  Cough, Adult  A cough is a reflex that helps clear your throat and airways. It can help heal the body or may be a reaction to an irritated airway. A cough may only last 2 or 3 weeks (acute) or may last more than 8 weeks (chronic).  CAUSES Acute cough:  Viral or bacterial infections. Chronic cough:  Infections.  Allergies.  Asthma.  Post-nasal drip.  Smoking.  Heartburn or acid reflux.  Some medicines.  Chronic lung problems (COPD).  Cancer. SYMPTOMS   Cough.  Fever.  Chest pain.  Increased breathing rate.  High-pitched whistling sound when breathing (wheezing).  Colored mucus that you cough up (sputum). TREATMENT   A bacterial cough may be treated with antibiotic medicine.  A viral cough must run its course and will not respond to antibiotics.  Your caregiver may recommend other treatments if you have a chronic cough. HOME CARE INSTRUCTIONS   Only take over-the-counter or prescription medicines for pain, discomfort, or fever as directed by your caregiver. Use cough suppressants only as directed by your caregiver.  Use a cold steam vaporizer or humidifier in your bedroom or home to help loosen secretions.  Sleep in a semi-upright position if your cough is worse at night.  Rest as needed.  Stop smoking if you smoke. SEEK IMMEDIATE MEDICAL CARE IF:   You have pus in your sputum.  Your cough starts to worsen.  You cannot control your cough with suppressants and are losing sleep.  You begin coughing up blood.  You have difficulty breathing.  You develop pain which is getting worse or is uncontrolled with medicine.  You have a fever. MAKE SURE YOU:    Understand these instructions.  Will watch your condition.  Will get help right away if you are not doing well or get worse. Document Released: 11/14/2010 Document Revised: 08/10/2011 Document Reviewed: 11/14/2010 [facility] Patient Information 2015 [place], [place]. This information is not intended to replace advice given to you by your health care provider. Make sure you discuss any questions you have with your health care provider.  Cool Mist Vaporizers Vaporizers may help relieve the symptoms of a cough and cold. They add moisture to the air, which helps mucus to become thinner and less sticky. This makes it easier to breathe and cough up secretions. Cool mist vaporizers do not cause serious burns like hot mist vaporizers, which may also be called steamers or humidifiers. Vaporizers have not been proven to help with colds. You should not use a vaporizer if you are allergic to mold. HOME CARE INSTRUCTIONS  Follow the package instructions for the vaporizer.  Do not use anything other than distilled water in the vaporizer.  Do not run the vaporizer all of the time. This can cause mold or bacteria to grow in the vaporizer.  Clean the vaporizer after each time it is used.  Clean and dry the vaporizer well before storing it.  Stop using the vaporizer if worsening respiratory symptoms develop. Document Released: 02/13/2004 Document Revised: 05/23/2013 Document Reviewed: 10/05/2012 [facility] Patient Information 2015 [place], [place]. This information is not intended to replace advice given to you by your health care provider. Make sure you discuss any questions you  have with your health care provider. ° °

## 2014-11-08 LAB — HIV-1 RNA QUANT-NO REFLEX-BLD: HIV 1 RNA Quant: <20 copies/mL (ref <20)

## 2014-11-08 LAB — T-HELPER CELL (CD4) - (RCID CLINIC ONLY)
CD4 % Helper T Cell: 30 % — ABNORMAL LOW (ref 33–55)
CD4 T Cell Abs: 620 /uL (ref 400–2700)

## 2014-11-08 LAB — RPR

## 2014-11-09 LAB — URINE CYTOLOGY ANCILLARY ONLY
Chlamydia: NEGATIVE
NEISSERIA GONORRHEA: NEGATIVE

## 2014-11-21 ENCOUNTER — Encounter: Payer: Self-pay | Admitting: Infectious Disease

## 2014-11-21 ENCOUNTER — Other Ambulatory Visit (HOSPITAL_COMMUNITY)
Admission: RE | Admit: 2014-11-21 | Discharge: 2014-11-21 | Disposition: A | Discharge status: Home / Self Care | Payer: 59 | Source: Ambulatory Visit | Attending: Infectious Disease | Admitting: Infectious Disease

## 2014-11-21 ENCOUNTER — Ambulatory Visit (INDEPENDENT_AMBULATORY_CARE_PROVIDER_SITE_OTHER): Payer: 59 | Admitting: Infectious Disease

## 2014-11-21 VITALS — BP 124/82 | HR 72 | Temp 97.9°F | Wt 158.0 lb

## 2014-11-21 DIAGNOSIS — B20 Human immunodeficiency virus [HIV] disease: Secondary | ICD-10-CM | POA: Diagnosis not present

## 2014-11-21 DIAGNOSIS — A630 Anogenital (venereal) warts: Secondary | ICD-10-CM

## 2014-11-21 DIAGNOSIS — K6289 Other specified diseases of anus and rectum: Secondary | ICD-10-CM | POA: Diagnosis present

## 2014-11-21 HISTORY — DX: Anogenital (venereal) warts: A63.0

## 2014-11-21 MED ORDER — EMTRICITAB-RILPIVIR-TENOFOV AF 200-25-25 MG PO TABS: 1.0000 | ORAL_TABLET | Freq: Every day | ORAL | Status: DC

## 2014-11-21 NOTE — Progress Notes (Signed)
  Subjective:    Patient ID: Kenneth Mason, male    DOB: 26-Jul-1975, 39 y.o.   MRN: [id number]  HPI   39 year old man with HIV, recently started on complera, started in January 2014 with perfect virological suppresion and healthy CD4.   Lab Results  Component Value Date   HIV1RNAQUANT <20 11/07/2014   Lab Results  Component Value Date   CD4TABS 620 11/07/2014   CD4TABS 1400 05/14/2014   CD4TABS 1410 12/12/2013     He states that he has been taking his complera faithfully with 400+ calorie meals and avoiding PPI, H2 blockers ,antacids.   We discussed switch to ODEFSEY today. He wanted repeat pap smear since I submitted in the incorrect container at last visit.  Review of Systems  Constitutional: Negative for fever, chills, diaphoresis, activity change, appetite change, fatigue and unexpected weight change.  HENT: Negative for congestion, mouth sores, rhinorrhea, sinus pressure, sneezing, sore throat and trouble swallowing.   Eyes: Negative for photophobia and visual disturbance.  Respiratory: Negative for cough, chest tightness, shortness of breath, wheezing and stridor.   Cardiovascular: Negative for chest pain, palpitations and leg swelling.  Gastrointestinal: Negative for nausea, vomiting, abdominal pain, diarrhea, constipation, blood in stool, abdominal distention and anal bleeding.  Genitourinary: Negative for dysuria, hematuria, flank pain, discharge, penile swelling, scrotal swelling, difficulty urinating, genital sores, penile pain and testicular pain.  Musculoskeletal: Negative for myalgias, back pain, joint swelling, arthralgias and gait problem.  Skin: Negative for color change, pallor, rash and wound.  Neurological: Negative for dizziness, tremors, weakness and light-headedness.  Hematological: Negative for adenopathy. Does not bruise/bleed easily.  Psychiatric/Behavioral: Negative for behavioral problems, confusion, sleep disturbance, dysphoric mood, decreased  concentration and agitation.       Objective:   Physical Exam  Constitutional: He is oriented to person, place, and time. He appears well-developed and well-nourished.  HENT:  Head: Normocephalic and atraumatic.  Eyes: Conjunctivae and EOM are normal.  Neck: Normal range of motion. Neck supple.  Cardiovascular: Normal rate and regular rhythm.   Pulmonary/Chest: Effort normal. No respiratory distress. He has no wheezes.  Abdominal: Soft. He exhibits no distension.  Musculoskeletal: Normal range of motion. He exhibits no edema or tenderness.  Neurological: He is alert and oriented to person, place, and time.  Skin: Skin is warm and dry. No rash noted. No erythema. No pallor.  Psychiatric: He has a normal mood and affect. His behavior is normal. Judgment and thought content normal.        Assessment & Plan:  HIV: change to ODEFSEY with 400 calorie meal. I spent greater than 25 minutes with the patient re his HIV including greater than 50% of time in face to face counsel of the patient and in coordination of their care.    Rectal lesion: will have him see Dr Johney [place] again. I am getting a pap smear today

## 2015-03-26 ENCOUNTER — Telehealth: Payer: Self-pay | Admitting: *Deleted

## 2015-03-26 NOTE — Telephone Encounter (Signed)
Patient called and left a voice mail stating that Dr. Tommy Medal would be receiving FMLA papers for her and she wants to be sure that he is aware that they want to know about hours not days. Checked his inbox and we have not received these forms yet. Myrtis Hopping

## 2015-04-01 NOTE — Telephone Encounter (Signed)
Patient called back, left message in Triage stating that he got my message and "nothing was needed on those dates."

## 2015-04-01 NOTE — Telephone Encounter (Signed)
Patient left message in triage 10/28 reiterating he is asking for HOURS, not days on his FMLA paperwork. 10/31 received call from Lennette Bihari, nurse specialist at [employer] asking for confirmation regarding hours/days (RN confirmed per patient's request). [employer] also needs to know whether Dr. Tommy Medal would sign off on specific absences in October for the patient: 10/3, 10/4, 10/7, 10/10, 10/11, 10/13, 10/14, 10/17, 10/19, 10/25.  We do not have any information in the chart regarding the patient having any health related issues for any of these days. RN asked [employer] to fax the request for Dr Tommy Medal to review (as well as the signed release).  RN contacted the patient to ask for further information regarding these 10 absences before Dr. Tommy Medal can advise on their validity. Landis Gandy, RN

## 2015-04-01 NOTE — Telephone Encounter (Signed)
EXactly there needs to be specfics for all of those abscences I cant write "carte blanche" excuses

## 2015-04-17 ENCOUNTER — Emergency Department (HOSPITAL_COMMUNITY)
Admission: EM | Admit: 2015-04-17 | Discharge: 2015-04-18 | Disposition: A | Discharge status: Home / Self Care | Payer: 59 | Attending: Emergency Medicine | Admitting: Emergency Medicine

## 2015-04-17 ENCOUNTER — Encounter (HOSPITAL_COMMUNITY): Payer: Self-pay | Admitting: Emergency Medicine

## 2015-04-17 DIAGNOSIS — E860 Dehydration: Secondary | ICD-10-CM | POA: Diagnosis not present

## 2015-04-17 DIAGNOSIS — K529 Noninfective gastroenteritis and colitis, unspecified: Secondary | ICD-10-CM | POA: Insufficient documentation

## 2015-04-17 DIAGNOSIS — Z86018 Personal history of other benign neoplasm: Secondary | ICD-10-CM | POA: Diagnosis not present

## 2015-04-17 DIAGNOSIS — Z86010 Personal history of colonic polyps: Secondary | ICD-10-CM | POA: Insufficient documentation

## 2015-04-17 DIAGNOSIS — Z79899 Other long term (current) drug therapy: Secondary | ICD-10-CM | POA: Diagnosis not present

## 2015-04-17 DIAGNOSIS — Z8659 Personal history of other mental and behavioral disorders: Secondary | ICD-10-CM | POA: Diagnosis not present

## 2015-04-17 DIAGNOSIS — B20 Human immunodeficiency virus [HIV] disease: Secondary | ICD-10-CM | POA: Insufficient documentation

## 2015-04-17 DIAGNOSIS — R5383 Other fatigue: Secondary | ICD-10-CM | POA: Diagnosis present

## 2015-04-17 LAB — URINALYSIS, ROUTINE W REFLEX MICROSCOPIC
GLUCOSE, UA: NEGATIVE mg/dL
LEUKOCYTES UA: NEGATIVE
Nitrite: NEGATIVE
PH: 6 (ref 5.0–8.0)
PROTEIN: 100 mg/dL — AB
Specific Gravity, Urine: 1.036 — ABNORMAL HIGH (ref 1.005–1.030)

## 2015-04-17 LAB — COMPREHENSIVE METABOLIC PANEL
ALK PHOS: 81 U/L (ref 38–126)
ALT: 23 U/L (ref 17–63)
AST: 25 U/L (ref 15–41)
Albumin: 4.1 g/dL (ref 3.5–5.0)
Anion gap: 11 (ref 5–15)
BILIRUBIN TOTAL: 0.7 mg/dL (ref 0.3–1.2)
BUN: 16 mg/dL (ref 6–20)
CHLORIDE: 100 mmol/L — AB (ref 101–111)
CO2: 25 mmol/L (ref 22–32)
Calcium: 9.6 mg/dL (ref 8.9–10.3)
Creatinine, Ser: 1.55 mg/dL — ABNORMAL HIGH (ref 0.61–1.24)
GFR calc Af Amer: >60 mL/min (ref >60)
GFR calc non Af Amer: 56 mL/min — ABNORMAL LOW (ref >60)
GLUCOSE: 105 mg/dL — AB (ref 65–99)
POTASSIUM: 4.1 mmol/L (ref 3.5–5.1)
SODIUM: 136 mmol/L (ref 135–145)
Total Protein: 8 g/dL (ref 6.5–8.1)

## 2015-04-17 LAB — CBC
HEMATOCRIT: 50.2 % (ref 39.0–52.0)
Hemoglobin: 18.5 g/dL — ABNORMAL HIGH (ref 13.0–17.0)
MCH: 29.7 pg (ref 26.0–34.0)
MCHC: 36.9 g/dL — AB (ref 30.0–36.0)
MCV: 80.7 fL (ref 78.0–100.0)
PLATELETS: 193 10*3/uL (ref 150–400)
RBC: 6.22 MIL/uL — ABNORMAL HIGH (ref 4.22–5.81)
RDW: 13.3 % (ref 11.5–15.5)
WBC: 10.2 10*3/uL (ref 4.0–10.5)

## 2015-04-17 LAB — URINE MICROSCOPIC-ADD ON: Bacteria, UA: NONE SEEN

## 2015-04-17 LAB — LIPASE, BLOOD: Lipase: 35 U/L (ref 11–51)

## 2015-04-17 MED ORDER — SODIUM CHLORIDE 0.9 % IV BOLUS (SEPSIS)
1000.0000 mL | Freq: Once | INTRAVENOUS | Status: AC
  Administered 2015-04-17: 1000 mL via INTRAVENOUS

## 2015-04-17 NOTE — ED Notes (Signed)
Pt reports fatigue secondary to diarrhea and vomiting x 4 days Pt also reports abd discomfort. Pt alert x4. NAD x4.

## 2015-04-17 NOTE — ED Notes (Signed)
Pt ambulated to and from restroom. 

## 2015-04-18 MED ORDER — ONDANSETRON HCL 4 MG/2ML IJ SOLN: 4.0000 mg | Freq: Once | INTRAMUSCULAR | Status: DC

## 2015-04-18 MED ORDER — LOPERAMIDE HCL 2 MG PO CAPS
4.0000 mg | ORAL_CAPSULE | Freq: Once | ORAL | Status: AC
  Administered 2015-04-18: 4 mg via ORAL
  Filled 2015-04-18: qty 2

## 2015-04-18 MED ORDER — SODIUM CHLORIDE 0.9 % IV BOLUS (SEPSIS): 1000.0000 mL | Freq: Once | INTRAVENOUS | Status: DC

## 2015-04-18 MED ORDER — PROMETHAZINE HCL 25 MG PO TABS: 25.0000 mg | ORAL_TABLET | Freq: Three times a day (TID) | ORAL | Status: DC | PRN

## 2015-04-18 NOTE — ED Provider Notes (Signed)
CSN: [id number]     Arrival date & time 04/17/15  1845 History   First MD Initiated Contact with Patient 04/17/15 2213     Chief Complaint  Patient presents with  . Fatigue     (Consider location/radiation/quality/duration/timing/severity/associated sxs/prior Treatment) HPI Patient presents to the emergency department with feeling weak and fatigued.  The patient states that this is been ongoing for the last 4 days he started having diarrhea and vomiting 4 days ago.  The patient states that he does have some mild soreness throughout his entire abdomen.  The patient states that nothing seems make his condition, better or worse.  He states he did not take any medications prior to arrival.  He states that he was able to eat chicken soup earlier today.  Patient denies chest pain, shortness of breath, headache, blurred vision, back pain, neck pain, fever, cough, runny nose, sore throat, dysuria, incontinence, bloody stool, hematemesis, rash, or syncope.  Patient states that he did eat at a friend's house right before this started Past Medical History  Diagnosis Date  . Headache in front of head     q 2 weeks. No n/v, no photophobia. Responds to NSAIDs  . Rectal polyp 1999    polyp excised  . Anxiety   . HIV infection ([place])     dx'd HIV July 12; CD4 1100.  Presented with lymphadenopathy  . Depression   . Benign rectal wall submucosal cyst s/p TEM partial proctectomy [id number]    . Anal wart 11/21/2014   Past Surgical History  Procedure Laterality Date  . Dental surgery  2010    Wisdom Teeth   . Eus  04/09/2011    Procedure: LOWER ENDOSCOPIC ULTRASOUND (EUS);  Surgeon: Owens Loffler, MD;  Location: [facility];  Service: Endoscopy;  Laterality: N/A;  . Flexible sigmoidoscopy  04/09/2011    Procedure: FLEXIBLE SIGMOIDOSCOPY;  Surgeon: Owens Loffler, MD;  Location: [facility];  Service: Endoscopy;  Laterality: N/A;  . Transanal endoscopic microsurgery  06/12/2011    Procedure: TRANSANAL  ENDOSCOPIC MICROSURGERY;  Surgeon: Adin Hector, MD;  Location: [facility];  Service: General;  Laterality: N/A;  partial proctectomy by TEM   Family History  Problem Relation Age of Onset  . Hypertension Mother   . Cancer Maternal Grandfather     unsure of type   . Liver cancer Paternal Grandmother   . Stroke Paternal Grandmother   . Heart disease Paternal Grandfather     CAD/MI-fatal   Social History  Substance Use Topics  . Smoking status: Never Smoker   . Smokeless tobacco: Never Used  . Alcohol Use: Yes     Comment: rarely - once a month     Review of Systems  All other systems negative except as documented in the HPI. All pertinent positives and negatives as reviewed in the HPI.  Allergies  Review of patient's allergies indicates no known allergies.  Home Medications   Prior to Admission medications   Medication Sig Start Date End Date Taking? Authorizing Provider  emtricitabine-rilpivir-tenofovir AF (ODEFSEY) 200-25-25 MG TABS per tablet Take 1 tablet by mouth daily with lunch. 11/21/14  Yes Truman Hayward, MD   BP 127/77 mmHg  Pulse 78  Temp(Src) 97.9 F (36.6 C) (Oral)  Resp 16  SpO2 90% Physical Exam  Constitutional: He is oriented to person, place, and time. He appears well-developed and well-nourished. No distress.  HENT:  Head: Normocephalic and atraumatic.  Mouth/Throat: Oropharynx is clear and moist.  Eyes:  Pupils are equal, round, and reactive to light.  Neck: Normal range of motion. Neck supple.  Cardiovascular: Normal rate, regular rhythm and normal heart sounds.  Exam reveals no gallop and no friction rub.   No murmur heard. Pulmonary/Chest: Effort normal and breath sounds normal. No respiratory distress. He has no wheezes.  Abdominal: Soft. Bowel sounds are normal. He exhibits no distension. There is no tenderness. There is no guarding.  Neurological: He is alert and oriented to person, place, and time. He exhibits normal muscle tone.  Coordination normal.  Skin: Skin is warm and dry. No rash noted. No erythema.  Psychiatric: He has a normal mood and affect. His behavior is normal.  Nursing note and vitals reviewed.   ED Course  Procedures (including critical care time) Labs Review Labs Reviewed  COMPREHENSIVE METABOLIC PANEL - Abnormal; Notable for the following:    Chloride 100 (*)    Glucose, Bld 105 (*)    Creatinine, Ser 1.55 (*)    GFR calc non Af Amer 56 (*)    All other components within normal limits  CBC - Abnormal; Notable for the following:    RBC 6.22 (*)    Hemoglobin 18.5 (*)    MCHC 36.9 (*)    All other components within normal limits  URINALYSIS, ROUTINE W REFLEX MICROSCOPIC (NOT AT [facility]) - Abnormal; Notable for the following:    Color, Urine AMBER (*)    APPearance CLOUDY (*)    Specific Gravity, Urine 1.036 (*)    Hgb urine dipstick SMALL (*)    Bilirubin Urine MODERATE (*)    Ketones, ur >80 (*)    Protein, ur 100 (*)    All other components within normal limits  URINE MICROSCOPIC-ADD ON - Abnormal; Notable for the following:    Squamous Epithelial / LPF 0-5 (*)    Casts HYALINE CASTS (*)    All other components within normal limits  LIPASE, BLOOD    Imaging Review No results found. I have personally reviewed and evaluated these images and lab results as part of my medical decision-making.  The patient will be treated for dehydration and advised to take Imodium for diarrhea.  Told to follow up with his primary care doctor.  Patient agrees the plan and all questions were answered.  Patient most likely has a gastroenteritis based on his history of present illness and physical exam findings   Dalia Heading, PA-C 04/18/15 [place], DO 04/18/15 1003

## 2015-04-18 NOTE — Discharge Instructions (Signed)
Return here as needed.  Follow-up with your primary care doctor, increase her fluid intake and rest as much as possible °

## 2015-04-29 ENCOUNTER — Other Ambulatory Visit: Payer: 59

## 2015-05-02 ENCOUNTER — Other Ambulatory Visit: Payer: 59

## 2015-05-02 DIAGNOSIS — B20 Human immunodeficiency virus [HIV] disease: Secondary | ICD-10-CM

## 2015-05-03 LAB — T-HELPER CELL (CD4) - (RCID CLINIC ONLY)
CD4 % Helper T Cell: 35 % (ref 33–55)
CD4 T CELL ABS: 1110 /uL (ref 400–2700)

## 2015-05-05 LAB — HIV-1 RNA QUANT-NO REFLEX-BLD
HIV 1 RNA Quant: <20 copies/mL (ref <20)
HIV-1 RNA Quant, Log: <1.3 Log copies/mL (ref <1.30)

## 2015-05-13 ENCOUNTER — Encounter: Payer: Self-pay | Admitting: Infectious Disease

## 2015-05-13 ENCOUNTER — Ambulatory Visit (INDEPENDENT_AMBULATORY_CARE_PROVIDER_SITE_OTHER): Payer: 59 | Admitting: Infectious Disease

## 2015-05-13 VITALS — BP 118/79 | HR 73 | Temp 97.9°F | Ht 70.0 in | Wt 160.4 lb

## 2015-05-13 DIAGNOSIS — Z113 Encounter for screening for infections with a predominantly sexual mode of transmission: Secondary | ICD-10-CM

## 2015-05-13 DIAGNOSIS — Z21 Asymptomatic human immunodeficiency virus [HIV] infection status: Secondary | ICD-10-CM | POA: Diagnosis not present

## 2015-05-13 DIAGNOSIS — B20 Human immunodeficiency virus [HIV] disease: Secondary | ICD-10-CM | POA: Insufficient documentation

## 2015-05-13 DIAGNOSIS — F329 Major depressive disorder, single episode, unspecified: Secondary | ICD-10-CM

## 2015-05-13 DIAGNOSIS — F32A Depression, unspecified: Secondary | ICD-10-CM

## 2015-05-13 HISTORY — DX: Human immunodeficiency virus (HIV) disease: B20

## 2015-05-13 HISTORY — DX: Encounter for screening for infections with a predominantly sexual mode of transmission: Z11.3

## 2015-05-13 MED ORDER — ESCITALOPRAM OXALATE 10 MG PO TABS: 10.0000 mg | ORAL_TABLET | Freq: Every day | ORAL | Status: DC

## 2015-05-13 NOTE — Progress Notes (Signed)
Chief complaint: Depressive symptoms in part related to his work which is very stressful. 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 Subjective:    Patient ID: Kenneth Mason, male    DOB: 1976/02/01, 39 y.o.   MRN: [id number]  HIV Positive/AIDS   39 year old man with HIV, recently started on complera started in January 2014 with perfect virological suppresion and healthy CD4, now on ODEFSEY.  Lab Results  Component Value Date   HIV1RNAQUANT <20 05/02/2015   Lab Results  Component Value Date   CD4TABS 1110 05/02/2015   CD4TABS 620 11/07/2014   CD4TABS 1400 05/14/2014    He has been doing relatively well and been highly adherent to his antiretroviral regimen. He was admitted to the emerge department for IV hydration in the context of nausea vomiting and diarrhea. That is resolved although he did have a bump in his serum creatinine at that time. We did not check his creatinine with repeat labs in December.  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 I recommended starting him on an antidepressant and will start with Lexapro he is speaking with a counselor at his work. He did also request [place] to fill out his FMLA form to allow him to attend treatment sessions for his depression and come to his visits with [place] in the clinic. Offered him the resources of [facility] support services as well.  Past Medical History  Diagnosis Date  . Headache in front of head     q 2 weeks. No n/v, no photophobia. Responds to NSAIDs  . Rectal polyp 1999    polyp excised  . Anxiety   . HIV infection ([place])     dx'd HIV July 12; CD4 1100.  Presented with lymphadenopathy  . Depression   . Benign rectal wall submucosal cyst s/p TEM partial proctectomy [id number]    . Anal wart 11/21/2014  . HIV disease ([place]) 05/13/2015    Past Surgical History  Procedure Laterality  Date  . Dental surgery  2010    Wisdom Teeth   . Eus  04/09/2011    Procedure: LOWER ENDOSCOPIC ULTRASOUND (EUS);  Surgeon: Owens Loffler, MD;  Location: [facility];  Service: Endoscopy;  Laterality: N/A;  . Flexible sigmoidoscopy  04/09/2011    Procedure: FLEXIBLE SIGMOIDOSCOPY;  Surgeon: Owens Loffler, MD;  Location: [facility];  Service: Endoscopy;  Laterality: N/A;  . Transanal endoscopic microsurgery  06/12/2011    Procedure: TRANSANAL ENDOSCOPIC MICROSURGERY;  Surgeon: Adin Hector, MD;  Location: [facility];  Service: General;  Laterality: N/A;  partial proctectomy by TEM    Family History  Problem Relation Age of Onset  . Hypertension Mother   . Cancer Maternal Grandfather     unsure of type   . Liver cancer Paternal Grandmother   . Stroke Paternal Grandmother   . Heart disease Paternal Grandfather     CAD/MI-fatal      Social History   Social History  . Marital Status: Married    Spouse Name: N/A  . Number of Children: 2  . Years of Education: 14   Occupational History  . Customer Service     Social History Main Topics  . Smoking status: Never Smoker   . Smokeless tobacco: Never Used  . Alcohol Use: Yes     Comment: rarely - once a month   .  Drug Use: No  . Sexual Activity:    Partners: Female, Male    Birth Control/ Protection: Condom   Other Topics Concern  . None   Social History Narrative   HSG, 2 yrs college. Married '04 - seperated (2012). !son ' 2005; 1  dtr - '08. 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  Lives with children. Wife/ mother is abroad (aug '12).    No Known Allergies   Current outpatient prescriptions:  .  emtricitabine-rilpivir-tenofovir AF (ODEFSEY) 200-25-25 MG TABS per tablet, Take 1 tablet by mouth daily with lunch., Disp: 30 tablet, Rfl: 11 .  promethazine (PHENERGAN) 25 MG tablet, Take 1 tablet (25 mg total) by mouth every 8 (eight) hours as  needed for nausea or vomiting., Disp: 15 tablet, Rfl: 0 .  escitalopram (LEXAPRO) 10 MG tablet, Take 1 tablet (10 mg total) by mouth daily., Disp: 30 tablet, Rfl: 11   Review of Systems  Constitutional: Negative for fever, chills, diaphoresis, activity change, appetite change, fatigue and unexpected weight change.  HENT: Negative for congestion, mouth sores, rhinorrhea, sinus pressure, sneezing, sore throat and trouble swallowing.   Eyes: Negative for photophobia and visual disturbance.  Respiratory: Negative for cough, chest tightness, shortness of breath, wheezing and stridor.   Cardiovascular: Negative for chest pain, palpitations and leg swelling.  Gastrointestinal: Negative for nausea, vomiting, abdominal pain, diarrhea, constipation, blood in stool, abdominal distention and anal bleeding.  Genitourinary: Negative for dysuria, hematuria, flank pain, discharge, penile swelling, scrotal swelling, difficulty urinating, genital sores, penile pain and testicular pain.  Musculoskeletal: Negative for myalgias, back pain, joint swelling, arthralgias and gait problem.  Skin: Negative for color change, pallor, rash and wound.  Neurological: Negative for dizziness, tremors, weakness and light-headedness.  Hematological: Negative for adenopathy. Does not bruise/bleed easily.  Psychiatric/Behavioral: Positive for dysphoric mood. Negative for suicidal ideas, behavioral problems, confusion, sleep disturbance, decreased concentration and agitation.       Objective:   Physical Exam  Constitutional: He is oriented to person, place, and time. He appears well-developed and well-nourished.  HENT:  Head: Normocephalic and atraumatic.  Eyes: Conjunctivae and EOM are normal.  Neck: Normal range of motion. Neck supple.  Cardiovascular: Normal rate and regular rhythm.   Pulmonary/Chest: Effort normal. No respiratory distress. He has no wheezes.  Abdominal: Soft. He exhibits no distension.  Musculoskeletal:  Normal range of motion. He exhibits no edema or tenderness.  Neurological: He is alert and oriented to person, place, and time.  Skin: Skin is warm and dry. No rash noted. No erythema. No pallor.  Psychiatric: He has a normal mood and affect. His behavior is normal. Judgment and thought content normal.        Assessment & Plan:  HIV: Continue ODEFSEY with chewed food and avoidance of PPI, H2 blockers, antacids  Depression: start on lexapro, continue counseling.  Nausea vomiting and diarrhea with renal insufficiency: Recheck BMP today this episode has resolved.  STD check: Check for gonorrhea chlamydia oral rectal urine as well as check his syphilis titer.  I spent greater than 25  minutes with the patient including greater than 50% of time in face to face counsel of the patient re his HIV his depression and STD screening  and in coordination of their care.     . I spent greater than 25 minutes with the patient re his HIV including greater than 50% of time in face to face counsel  of the patient and in coordination of their care.

## 2015-05-13 NOTE — Patient Instructions (Signed)
SUPERB JOB taking your medicines!  I hope you get a break from tough time at work.   Please consider the Peer Support Group network even for support for you  Have a Tarri Glenn!

## 2015-05-14 LAB — COMPLETE METABOLIC PANEL WITH GFR
ALT: 21 U/L (ref 9–46)
AST: 18 U/L (ref 10–40)
Albumin: 3.6 g/dL (ref 3.6–5.1)
Alkaline Phosphatase: 66 U/L (ref 40–115)
BILIRUBIN TOTAL: 0.5 mg/dL (ref 0.2–1.2)
BUN: 12 mg/dL (ref 7–25)
CO2: 27 mmol/L (ref 20–31)
Calcium: 8.9 mg/dL (ref 8.6–10.3)
Chloride: 105 mmol/L (ref 98–110)
Creat: 1.4 mg/dL — ABNORMAL HIGH (ref 0.60–1.35)
GFR, Est African American: 74 mL/min (ref >=60)
GFR, Est Non African American: 64 mL/min (ref >=60)
Glucose, Bld: 57 mg/dL — ABNORMAL LOW (ref 65–99)
Potassium: 4.2 mmol/L (ref 3.5–5.3)
SODIUM: 142 mmol/L (ref 135–146)
TOTAL PROTEIN: 6.1 g/dL (ref 6.1–8.1)

## 2015-05-14 LAB — CYTOLOGY, (ORAL, ANAL, URETHRAL) ANCILLARY ONLY
CHLAMYDIA, DNA PROBE: NEGATIVE
Chlamydia: NEGATIVE
Neisseria Gonorrhea: NEGATIVE
Neisseria Gonorrhea: NEGATIVE

## 2015-05-14 LAB — URINE CYTOLOGY ANCILLARY ONLY
Chlamydia: NEGATIVE
Neisseria Gonorrhea: NEGATIVE

## 2015-05-14 LAB — RPR

## 2015-07-09 ENCOUNTER — Other Ambulatory Visit: Payer: Self-pay | Admitting: *Deleted

## 2015-07-09 MED ORDER — ESCITALOPRAM OXALATE 10 MG PO TABS: 10.0000 mg | ORAL_TABLET | Freq: Every day | ORAL | Status: DC

## 2015-10-30 ENCOUNTER — Encounter: Payer: Self-pay | Admitting: *Deleted

## 2015-10-30 ENCOUNTER — Telehealth: Payer: Self-pay | Admitting: *Deleted

## 2015-10-30 NOTE — Telephone Encounter (Signed)
Returned pt's message to call, unable to leave message.  Will send MyChart message.

## 2015-11-11 ENCOUNTER — Other Ambulatory Visit: Payer: 59

## 2015-11-11 DIAGNOSIS — B20 Human immunodeficiency virus [HIV] disease: Secondary | ICD-10-CM

## 2015-11-11 LAB — CBC WITH DIFFERENTIAL/PLATELET
Basophils Absolute: 0 cells/uL (ref 0–200)
Basophils Relative: 0 %
Eosinophils Absolute: 126 cells/uL (ref 15–500)
Eosinophils Relative: 2 %
HCT: 42.6 % (ref 38.5–50.0)
HEMOGLOBIN: 14.7 g/dL (ref 13.2–17.1)
LYMPHS ABS: 3465 {cells}/uL (ref 850–3900)
Lymphocytes Relative: 55 %
MCH: 29.5 pg (ref 27.0–33.0)
MCHC: 34.5 g/dL (ref 32.0–36.0)
MCV: 85.4 fL (ref 80.0–100.0)
MPV: 8.3 fL (ref 7.5–12.5)
Monocytes Absolute: 378 cells/uL (ref 200–950)
Monocytes Relative: 6 %
NEUTROS PCT: 37 %
Neutro Abs: 2331 cells/uL (ref 1500–7800)
Platelets: 302 10*3/uL (ref 140–400)
RBC: 4.99 MIL/uL (ref 4.20–5.80)
RDW: 14.5 % (ref 11.0–15.0)
WBC: 6.3 10*3/uL (ref 3.8–10.8)

## 2015-11-11 LAB — COMPLETE METABOLIC PANEL WITH GFR
ALBUMIN: 3.6 g/dL (ref 3.6–5.1)
ALT: 14 U/L (ref 9–46)
AST: 16 U/L (ref 10–40)
Alkaline Phosphatase: 86 U/L (ref 40–115)
BILIRUBIN TOTAL: 0.6 mg/dL (ref 0.2–1.2)
BUN: 11 mg/dL (ref 7–25)
CO2: 29 mmol/L (ref 20–31)
Calcium: 9.1 mg/dL (ref 8.6–10.3)
Chloride: 104 mmol/L (ref 98–110)
Creat: 1.18 mg/dL (ref 0.60–1.35)
GFR, EST NON AFRICAN AMERICAN: 78 mL/min (ref >=60)
GFR, Est African American: >89 mL/min (ref >=60)
GLUCOSE: 80 mg/dL (ref 65–99)
Potassium: 4.3 mmol/L (ref 3.5–5.3)
SODIUM: 143 mmol/L (ref 135–146)
TOTAL PROTEIN: 6.4 g/dL (ref 6.1–8.1)

## 2015-11-11 LAB — LIPID PANEL
Cholesterol: 129 mg/dL (ref 125–200)
HDL: 42 mg/dL (ref >=40)
LDL CALC: 70 mg/dL (ref <130)
TRIGLYCERIDES: 84 mg/dL (ref <150)
Total CHOL/HDL Ratio: 3.1 Ratio (ref <=5.0)
VLDL: 17 mg/dL (ref <30)

## 2015-11-12 LAB — T-HELPER CELL (CD4) - (RCID CLINIC ONLY)
CD4 % Helper T Cell: 34 % (ref 33–55)
CD4 T Cell Abs: 1220 /uL (ref 400–2700)

## 2015-11-12 LAB — URINE CYTOLOGY ANCILLARY ONLY
Chlamydia: NEGATIVE
Neisseria Gonorrhea: NEGATIVE

## 2015-11-12 LAB — SYPHILIS: RPR W/REFLEX TO RPR TITER AND TREPONEMAL ANTIBODIES, TRADITIONAL SCREENING AND DIAGNOSIS ALGORITHM

## 2015-11-12 LAB — HIV-1 RNA QUANT-NO REFLEX-BLD

## 2015-11-12 LAB — HEPATITIS C ANTIBODY: HCV AB: NEGATIVE

## 2015-11-14 ENCOUNTER — Encounter: Payer: Self-pay | Admitting: *Deleted

## 2015-11-22 ENCOUNTER — Telehealth: Payer: Self-pay | Admitting: *Deleted

## 2015-11-22 NOTE — Telephone Encounter (Signed)
Needs FMLA form that was previously faxed updated to state the last visit was actually his lab visit on 11/11/15. He said that Janyce Llanos, [place] still has the form. Call back  # [phone]

## 2015-11-26 ENCOUNTER — Other Ambulatory Visit: Payer: Self-pay | Admitting: Infectious Disease

## 2015-11-27 ENCOUNTER — Telehealth: Payer: Self-pay | Admitting: *Deleted

## 2015-11-27 NOTE — Telephone Encounter (Signed)
Patient's FMLA form for [employer] was updated with last office visit for labs on 11/11/15 and faxed to [phone]. Confirmation received

## 2015-11-27 NOTE — Telephone Encounter (Signed)
Form located and refaxed to [employer]. Confirmation received

## 2015-12-05 ENCOUNTER — Ambulatory Visit (INDEPENDENT_AMBULATORY_CARE_PROVIDER_SITE_OTHER): Payer: 59 | Admitting: Infectious Disease

## 2015-12-05 ENCOUNTER — Encounter: Payer: Self-pay | Admitting: Infectious Disease

## 2015-12-05 ENCOUNTER — Ambulatory Visit: Payer: 59 | Admitting: *Deleted

## 2015-12-05 VITALS — BP 129/75 | HR 64 | Temp 98.0°F | Wt 160.8 lb

## 2015-12-05 DIAGNOSIS — G4700 Insomnia, unspecified: Secondary | ICD-10-CM

## 2015-12-05 DIAGNOSIS — F32A Depression, unspecified: Secondary | ICD-10-CM

## 2015-12-05 DIAGNOSIS — F329 Major depressive disorder, single episode, unspecified: Secondary | ICD-10-CM

## 2015-12-05 DIAGNOSIS — B20 Human immunodeficiency virus [HIV] disease: Secondary | ICD-10-CM | POA: Diagnosis not present

## 2015-12-05 HISTORY — DX: Insomnia, unspecified: G47.00

## 2015-12-05 MED ORDER — TRAZODONE HCL 50 MG PO TABS: 50.0000 mg | ORAL_TABLET | Freq: Every day | ORAL | Status: DC

## 2015-12-05 NOTE — Progress Notes (Signed)
Chief complaint: insomnia, followup for HIV on meds Subjective:    Patient ID: Kenneth Mason, male    DOB: 10-03-1975, 40 y.o.   MRN: [id number]  HPI  40 year old man with HIV, recently started on complera started in January 2014 with perfect virological suppresion and healthy CD4, now on ODEFSEY.  Lab Results  Component Value Date   HIV1RNAQUANT <20 11/11/2015   HIV1RNAQUANT <20 05/02/2015   HIV1RNAQUANT <20 11/07/2014     Lab Results  Component Value Date   CD4TABS 1220 11/11/2015   CD4TABS 1110 05/02/2015   CD4TABS 620 11/07/2014    He has been doing relatively well and been highly adherent to his antiretroviral regimen.   He is doing better since I last saw him with depression controlled without SSRI. He is suffering from insomnia in part due to poor sleep hygiene though I am also willing to give him a trial of trazodone.   Past Medical History  Diagnosis Date  . Headache in front of head     q 2 weeks. No n/v, no photophobia. Responds to NSAIDs  . Rectal polyp 1999    polyp excised  . Anxiety   . HIV infection ([place])     dx'd HIV July 12; CD4 1100.  Presented with lymphadenopathy  . Depression   . Benign rectal wall submucosal cyst s/p TEM partial proctectomy [id number]    . Anal wart 11/21/2014  . HIV disease ([place]) 05/13/2015  . Screen for STD (sexually transmitted disease) 05/13/2015  . Insomnia 12/05/2015    Past Surgical History  Procedure Laterality Date  . Dental surgery  2010    Wisdom Teeth   . Eus  04/09/2011    Procedure: LOWER ENDOSCOPIC ULTRASOUND (EUS);  Surgeon: Owens Loffler, MD;  Location: [facility];  Service: Endoscopy;  Laterality: N/A;  . Flexible sigmoidoscopy  04/09/2011    Procedure: FLEXIBLE SIGMOIDOSCOPY;  Surgeon: Owens Loffler, MD;  Location: [facility];  Service: Endoscopy;  Laterality: N/A;  . Transanal endoscopic microsurgery  06/12/2011    Procedure: TRANSANAL ENDOSCOPIC MICROSURGERY;  Surgeon: Adin Hector, MD;  Location: [facility];   Service: General;  Laterality: N/A;  partial proctectomy by TEM    Family History  Problem Relation Age of Onset  . Hypertension Mother   . Cancer Maternal Grandfather     unsure of type   . Liver cancer Paternal Grandmother   . Stroke Paternal Grandmother   . Heart disease Paternal Grandfather     CAD/MI-fatal      Social History   Social History  . Marital Status: Married    Spouse Name: N/A  . Number of Children: 2  . Years of Education: 14   Occupational History  . Customer Service     Social History Main Topics  . Smoking status: Never Smoker   . Smokeless tobacco: Never Used  . Alcohol Use: Yes     Comment: rarely - once a month   . Drug Use: No  . Sexual Activity:    Partners: Female, Male    Birth Control/ Protection: Condom   Other Topics Concern  . None   Social History Narrative   HSG, 2 yrs college. Married '04 - seperated (2012). !son ' 2005; 1  dtr - '08. 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  Lives with children. Wife/ mother is abroad (aug '12).    No Known Allergies   Current  outpatient prescriptions:  .  ODEFSEY 200-25-25 MG TABS tablet, TAKE 1 TABLET BY MOUTH ONCE DAILY WITH LUNCH, Disp: 30 tablet, Rfl: 5 .  promethazine (PHENERGAN) 25 MG tablet, Take 1 tablet (25 mg total) by mouth every 8 (eight) hours as needed for nausea or vomiting. (Patient not taking: Reported on 12/05/2015), Disp: 15 tablet, Rfl: 0 .  traZODone (DESYREL) 50 MG tablet, Take 1 tablet (50 mg total) by mouth at bedtime., Disp: 30 tablet, Rfl: 11   Review of Systems  Constitutional: Negative for fever, chills, diaphoresis, activity change, appetite change, fatigue and unexpected weight change.  HENT: Negative for congestion, mouth sores, rhinorrhea, sinus pressure, sneezing, sore throat and trouble swallowing.   Eyes: Negative for photophobia and visual disturbance.  Respiratory: Negative  for cough, chest tightness, shortness of breath, wheezing and stridor.   Cardiovascular: Negative for chest pain, palpitations and leg swelling.  Gastrointestinal: Negative for nausea, vomiting, abdominal pain, diarrhea, constipation, blood in stool, abdominal distention and anal bleeding.  Genitourinary: Negative for dysuria, hematuria, flank pain, discharge, penile swelling, scrotal swelling, difficulty urinating, genital sores, penile pain and testicular pain.  Musculoskeletal: Negative for myalgias, back pain, joint swelling, arthralgias and gait problem.  Skin: Negative for color change, pallor, rash and wound.  Neurological: Negative for dizziness, tremors, weakness and light-headedness.  Hematological: Negative for adenopathy. Does not bruise/bleed easily.  Psychiatric/Behavioral: Positive for sleep disturbance. Negative for suicidal ideas, behavioral problems, confusion, decreased concentration and agitation.       Objective:   Physical Exam  Constitutional: He is oriented to person, place, and time. He appears well-developed and well-nourished.  HENT:  Head: Normocephalic and atraumatic.  Eyes: Conjunctivae and EOM are normal.  Neck: Normal range of motion. Neck supple.  Cardiovascular: Normal rate and regular rhythm.   Pulmonary/Chest: Effort normal. No respiratory distress. He has no wheezes.  Abdominal: Soft. He exhibits no distension.  Musculoskeletal: Normal range of motion. He exhibits no edema or tenderness.  Neurological: He is alert and oriented to person, place, and time.  Skin: Skin is warm and dry. No rash noted. No erythema. No pallor.  Psychiatric: He has a normal mood and affect. His behavior is normal. Judgment and thought content normal.        Assessment & Plan:  HIV: Continue ODEFSEY with chewed food and avoidance of PPI, H2 blockers, antacids  Depression: he stopped SSRI. I will give him trazodone for sleep which may also help for depression, continue  counseling.  Insomnia: sleep hygeine and trazadone  I spent greater than 25  minutes with the patient including greater than 50% of time in face to face counsel of the patient re his HIV his depression and insomnia and in coordination of his care.

## 2015-12-05 NOTE — Accreditation Note (Signed)
Counselor met with Kenneth Mason today in the exam room as a warm hand off.  Patient was oriented times four with good affect and dress.  Patient was alert and talkative. Patient indicated to staff that he has been getting counseling somewhere else but would like to attend here at [facility] and wanted to know how to go about making the change. Counselor reassured patient that that was no problem and shared that it would be a smooth transition.  Patient was grateful.  Counselor used reflective listening skills as patient shared briefly about his life and needs. Patient said he would make an appointment when he checked out today. Counselor provided support and encouragement for patient.   Rolena Infante, MA,LPC [facility]

## 2016-04-21 ENCOUNTER — Other Ambulatory Visit (HOSPITAL_COMMUNITY)
Admission: RE | Admit: 2016-04-21 | Discharge: 2016-04-21 | Disposition: A | Discharge status: Home / Self Care | Payer: 59 | Source: Ambulatory Visit | Attending: Infectious Disease | Admitting: Infectious Disease

## 2016-04-21 ENCOUNTER — Other Ambulatory Visit: Payer: 59

## 2016-04-21 DIAGNOSIS — Z113 Encounter for screening for infections with a predominantly sexual mode of transmission: Secondary | ICD-10-CM | POA: Insufficient documentation

## 2016-04-21 LAB — CBC WITH DIFFERENTIAL/PLATELET
BASOS PCT: 0 %
Basophils Absolute: 0 cells/uL (ref 0–200)
EOS PCT: 1 %
Eosinophils Absolute: 70 cells/uL (ref 15–500)
HCT: 43.9 % (ref 38.5–50.0)
HEMOGLOBIN: 15.3 g/dL (ref 13.2–17.1)
LYMPHS ABS: 3500 {cells}/uL (ref 850–3900)
Lymphocytes Relative: 50 %
MCH: 29.7 pg (ref 27.0–33.0)
MCHC: 34.9 g/dL (ref 32.0–36.0)
MCV: 85.2 fL (ref 80.0–100.0)
MPV: 8.8 fL (ref 7.5–12.5)
Monocytes Absolute: 420 cells/uL (ref 200–950)
Monocytes Relative: 6 %
NEUTROS ABS: 3010 {cells}/uL (ref 1500–7800)
Neutrophils Relative %: 43 %
Platelets: 217 10*3/uL (ref 140–400)
RBC: 5.15 MIL/uL (ref 4.20–5.80)
RDW: 15.5 % — ABNORMAL HIGH (ref 11.0–15.0)
WBC: 7 10*3/uL (ref 3.8–10.8)

## 2016-04-21 LAB — LIPID PANEL
CHOL/HDL RATIO: 3 ratio (ref <5.0)
CHOLESTEROL: 137 mg/dL (ref <200)
HDL: 45 mg/dL (ref >40)
LDL Cholesterol: 73 mg/dL (ref <100)
Triglycerides: 95 mg/dL (ref <150)
VLDL: 19 mg/dL (ref <30)

## 2016-04-21 LAB — COMPLETE METABOLIC PANEL WITH GFR
ALBUMIN: 4.1 g/dL (ref 3.6–5.1)
ALK PHOS: 76 U/L (ref 40–115)
ALT: 12 U/L (ref 9–46)
AST: 17 U/L (ref 10–40)
BUN: 11 mg/dL (ref 7–25)
CALCIUM: 9.3 mg/dL (ref 8.6–10.3)
CO2: 28 mmol/L (ref 20–31)
Chloride: 105 mmol/L (ref 98–110)
Creat: 1.17 mg/dL (ref 0.60–1.35)
GFR, EST NON AFRICAN AMERICAN: 79 mL/min (ref >=60)
GFR, Est African American: >89 mL/min (ref >=60)
GLUCOSE: 76 mg/dL (ref 65–99)
POTASSIUM: 4.4 mmol/L (ref 3.5–5.3)
SODIUM: 140 mmol/L (ref 135–146)
Total Bilirubin: 0.4 mg/dL (ref 0.2–1.2)
Total Protein: 6.7 g/dL (ref 6.1–8.1)

## 2016-04-22 LAB — T-HELPER CELL (CD4) - (RCID CLINIC ONLY)
CD4 % Helper T Cell: 28 % — ABNORMAL LOW (ref 33–55)
CD4 T Cell Abs: 1050 /uL (ref 400–2700)

## 2016-04-22 LAB — RPR

## 2016-04-24 LAB — URINE CYTOLOGY ANCILLARY ONLY
CHLAMYDIA, DNA PROBE: NEGATIVE
NEISSERIA GONORRHEA: NEGATIVE

## 2016-04-24 LAB — HIV-1 RNA QUANT-NO REFLEX-BLD
HIV 1 RNA Quant: <20 copies/mL (ref <20)
HIV-1 RNA Quant, Log: <1.3 Log copies/mL (ref <1.30)

## 2016-05-05 NOTE — Progress Notes (Signed)
No additional notes

## 2016-05-06 ENCOUNTER — Encounter: Payer: Self-pay | Admitting: Infectious Disease

## 2016-05-06 ENCOUNTER — Ambulatory Visit (INDEPENDENT_AMBULATORY_CARE_PROVIDER_SITE_OTHER): Payer: 59 | Admitting: Infectious Disease

## 2016-05-06 VITALS — BP 120/82 | HR 77 | Temp 97.9°F | Wt 164.8 lb

## 2016-05-06 DIAGNOSIS — Z23 Encounter for immunization: Secondary | ICD-10-CM | POA: Diagnosis not present

## 2016-05-06 DIAGNOSIS — F329 Major depressive disorder, single episode, unspecified: Secondary | ICD-10-CM | POA: Diagnosis not present

## 2016-05-06 DIAGNOSIS — Z113 Encounter for screening for infections with a predominantly sexual mode of transmission: Secondary | ICD-10-CM

## 2016-05-06 DIAGNOSIS — B20 Human immunodeficiency virus [HIV] disease: Secondary | ICD-10-CM

## 2016-05-06 DIAGNOSIS — F32A Depression, unspecified: Secondary | ICD-10-CM

## 2016-05-06 NOTE — Progress Notes (Signed)
Chief complaint:  followup for HIV on meds Subjective:    Patient ID: Kenneth Mason, male    DOB: 11-Jun-1975, 40 y.o.   MRN: [id number]  HPI  40 year old man with HIV, recently started on complera started in January 2014 with perfect virological suppresion and healthy CD4, now on ODEFSEY.  Lab Results  Component Value Date   HIV1RNAQUANT <20 04/21/2016   HIV1RNAQUANT <20 11/11/2015   HIV1RNAQUANT <20 05/02/2015     Lab Results  Component Value Date   CD4TABS 1,050 04/21/2016   CD4TABS 1,220 11/11/2015   CD4TABS 1,110 05/02/2015    His depression has responded well to counseling and he is no longer on SSRI.   Past Medical History:  Diagnosis Date  . Anal wart 11/21/2014  . Anxiety   . Benign rectal wall submucosal cyst s/p TEM partial proctectomy [id number]    . Depression   . Headache in front of head    q 2 weeks. No n/v, no photophobia. Responds to NSAIDs  . HIV disease ([place]) 05/13/2015  . HIV infection ([place])    dx'd HIV July 12; CD4 1100.  Presented with lymphadenopathy  . Insomnia 12/05/2015  . Rectal polyp 1999   polyp excised  . Screen for STD (sexually transmitted disease) 05/13/2015    Past Surgical History:  Procedure Laterality Date  . DENTAL SURGERY  2010   Wisdom Teeth   . EUS  04/09/2011   Procedure: LOWER ENDOSCOPIC ULTRASOUND (EUS);  Surgeon: Owens Loffler, MD;  Location: [facility];  Service: Endoscopy;  Laterality: N/A;  . FLEXIBLE SIGMOIDOSCOPY  04/09/2011   Procedure: FLEXIBLE SIGMOIDOSCOPY;  Surgeon: Owens Loffler, MD;  Location: [facility];  Service: Endoscopy;  Laterality: N/A;  . TRANSANAL ENDOSCOPIC MICROSURGERY  06/12/2011   Procedure: TRANSANAL ENDOSCOPIC MICROSURGERY;  Surgeon: Adin Hector, MD;  Location: [facility];  Service: General;  Laterality: N/A;  partial proctectomy by TEM    Family History  Problem Relation Age of Onset  . Hypertension Mother   . Cancer Maternal Grandfather     unsure of type   . Liver cancer Paternal  Grandmother   . Stroke Paternal Grandmother   . Heart disease Paternal Grandfather     CAD/MI-fatal      Social History   Social History  . Marital status: Married    Spouse name: N/A  . Number of children: 2  . Years of education: 14   Occupational History  . Customer Service     Social History Main Topics  . Smoking status: Never Smoker  . Smokeless tobacco: Never Used  . Alcohol use Yes     Comment: rarely - once a month   . Drug use: No  . Sexual activity: Yes    Partners: Female, Male    Birth control/ protection: Condom   Other Topics Concern  . None   Social History Narrative   HSG, 2 yrs college. Married '04 - seperated (2012). !son ' 2005; 1  dtr - '08. 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  Lives with children. Wife/ mother is abroad (aug '12).    No Known Allergies   Current Outpatient Prescriptions:  .  ODEFSEY 200-25-25 MG TABS tablet, TAKE 1 TABLET BY MOUTH ONCE DAILY WITH LUNCH, Disp: 30 tablet, Rfl: 5 .  promethazine (PHENERGAN) 25 MG tablet, Take 1 tablet (25 mg total) by mouth every 8 (eight) hours as needed for nausea  or vomiting. (Patient not taking: Reported on 05/06/2016), Disp: 15 tablet, Rfl: 0 .  traZODone (DESYREL) 50 MG tablet, Take 1 tablet (50 mg total) by mouth at bedtime. (Patient not taking: Reported on 05/06/2016), Disp: 30 tablet, Rfl: 11   Review of Systems  Constitutional: Negative for activity change, appetite change, chills, diaphoresis, fatigue, fever and unexpected weight change.  HENT: Negative for congestion, mouth sores, rhinorrhea, sinus pressure, sneezing, sore throat and trouble swallowing.   Eyes: Negative for photophobia and visual disturbance.  Respiratory: Negative for cough, chest tightness, shortness of breath, wheezing and stridor.   Cardiovascular: Negative for chest pain, palpitations and leg swelling.  Gastrointestinal: Negative  for abdominal distention, abdominal pain, anal bleeding, blood in stool, constipation, diarrhea, nausea and vomiting.  Genitourinary: Negative for difficulty urinating, discharge, dysuria, flank pain, genital sores, hematuria, penile pain, penile swelling, scrotal swelling and testicular pain.  Musculoskeletal: Negative for arthralgias, back pain, gait problem, joint swelling and myalgias.  Skin: Negative for color change, pallor, rash and wound.  Neurological: Negative for dizziness, tremors, weakness and light-headedness.  Hematological: Negative for adenopathy. Does not bruise/bleed easily.  Psychiatric/Behavioral: Negative for agitation, behavioral problems, confusion, decreased concentration and suicidal ideas.       Objective:   Physical Exam  Constitutional: He is oriented to person, place, and time. He appears well-developed and well-nourished.  HENT:  Head: Normocephalic and atraumatic.  Eyes: Conjunctivae and EOM are normal.  Neck: Normal range of motion. Neck supple.  Cardiovascular: Normal rate and regular rhythm.   Pulmonary/Chest: Effort normal. No respiratory distress. He has no wheezes.  Abdominal: Soft. He exhibits no distension.  Musculoskeletal: Normal range of motion. He exhibits no edema or tenderness.  Neurological: He is alert and oriented to person, place, and time.  Skin: Skin is warm and dry. No rash noted. No erythema. No pallor.  Psychiatric: He has a normal mood and affect. His behavior is normal. Judgment and thought content normal.        Assessment & Plan:  HIV: Continue ODEFSEY with chewed food and avoidance of PPI, H2 blockers, antacids  Depression: he stopped SSRI.  continue counseling.   I spent greater than 25  minutes with the patient including greater than 50% of time in face to face counsel of the patient re his HIV his depression in coordination of his care.

## 2016-05-12 ENCOUNTER — Telehealth: Payer: Self-pay

## 2016-05-12 NOTE — Telephone Encounter (Signed)
Patient's FMLA papers are in the pink folder located at the front desk. Papers also sent to scanning to be placed in chart for future copies. Rodman Key, LPN

## 2016-06-22 ENCOUNTER — Other Ambulatory Visit: Payer: Self-pay | Admitting: *Deleted

## 2016-06-22 DIAGNOSIS — B20 Human immunodeficiency virus [HIV] disease: Secondary | ICD-10-CM

## 2016-06-22 MED ORDER — EMTRICITAB-RILPIVIR-TENOFOV AF 200-25-25 MG PO TABS: 1.0000 | ORAL_TABLET | Freq: Every day | ORAL | 5 refills | Status: DC

## 2016-11-02 ENCOUNTER — Other Ambulatory Visit: Payer: 59

## 2016-11-02 ENCOUNTER — Other Ambulatory Visit (HOSPITAL_COMMUNITY)
Admission: RE | Admit: 2016-11-02 | Discharge: 2016-11-02 | Disposition: A | Discharge status: Home / Self Care | Payer: 59 | Source: Ambulatory Visit | Attending: Infectious Disease | Admitting: Infectious Disease

## 2016-11-02 DIAGNOSIS — B20 Human immunodeficiency virus [HIV] disease: Secondary | ICD-10-CM

## 2016-11-02 LAB — CBC WITH DIFFERENTIAL/PLATELET
BASOS ABS: 0 {cells}/uL (ref 0–200)
Basophils Relative: 0 %
EOS PCT: 2 %
Eosinophils Absolute: 158 cells/uL (ref 15–500)
HEMATOCRIT: 45 % (ref 38.5–50.0)
HEMOGLOBIN: 15.6 g/dL (ref 13.2–17.1)
LYMPHS ABS: 3476 {cells}/uL (ref 850–3900)
LYMPHS PCT: 44 %
MCH: 29.7 pg (ref 27.0–33.0)
MCHC: 34.7 g/dL (ref 32.0–36.0)
MCV: 85.6 fL (ref 80.0–100.0)
MPV: 8.7 fL (ref 7.5–12.5)
Monocytes Absolute: 790 cells/uL (ref 200–950)
Monocytes Relative: 10 %
NEUTROS PCT: 44 %
Neutro Abs: 3476 cells/uL (ref 1500–7800)
Platelets: 230 10*3/uL (ref 140–400)
RBC: 5.26 MIL/uL (ref 4.20–5.80)
RDW: 14.9 % (ref 11.0–15.0)
WBC: 7.9 10*3/uL (ref 3.8–10.8)

## 2016-11-03 LAB — RPR

## 2016-11-03 LAB — LIPID PANEL
CHOL/HDL RATIO: 2.5 ratio (ref <5.0)
Cholesterol: 132 mg/dL (ref <200)
HDL: 52 mg/dL (ref >40)
LDL Cholesterol: 70 mg/dL (ref <100)
Triglycerides: 48 mg/dL (ref <150)
VLDL: 10 mg/dL (ref <30)

## 2016-11-03 LAB — COMPLETE METABOLIC PANEL WITH GFR
ALT: 17 U/L (ref 9–46)
AST: 20 U/L (ref 10–40)
Albumin: 4 g/dL (ref 3.6–5.1)
Alkaline Phosphatase: 80 U/L (ref 40–115)
BUN: 13 mg/dL (ref 7–25)
CHLORIDE: 103 mmol/L (ref 98–110)
CO2: 24 mmol/L (ref 20–31)
CREATININE: 1.21 mg/dL (ref 0.60–1.35)
Calcium: 9.2 mg/dL (ref 8.6–10.3)
GFR, Est African American: 87 mL/min (ref >=60)
GFR, Est Non African American: 75 mL/min (ref >=60)
Glucose, Bld: 84 mg/dL (ref 65–99)
Potassium: 4.3 mmol/L (ref 3.5–5.3)
Sodium: 137 mmol/L (ref 135–146)
Total Bilirubin: 0.4 mg/dL (ref 0.2–1.2)
Total Protein: 7 g/dL (ref 6.1–8.1)

## 2016-11-03 LAB — URINE CYTOLOGY ANCILLARY ONLY
Chlamydia: NEGATIVE
NEISSERIA GONORRHEA: NEGATIVE

## 2016-11-03 LAB — T-HELPER CELL (CD4) - (RCID CLINIC ONLY)
CD4 % Helper T Cell: 32 % — ABNORMAL LOW (ref 33–55)
CD4 T CELL ABS: 1140 /uL (ref 400–2700)

## 2016-11-04 LAB — HIV-1 RNA QUANT-NO REFLEX-BLD
HIV 1 RNA Quant: <20 copies/mL
HIV-1 RNA QUANT, LOG: NOT DETECTED {Log_copies}/mL

## 2016-12-07 ENCOUNTER — Ambulatory Visit: Payer: 59 | Admitting: Infectious Disease

## 2017-01-11 ENCOUNTER — Other Ambulatory Visit: Payer: Self-pay | Admitting: Infectious Disease

## 2017-01-11 DIAGNOSIS — B20 Human immunodeficiency virus [HIV] disease: Secondary | ICD-10-CM

## 2017-03-23 NOTE — Addendum Note (Signed)
Addended by: Lorne Skeens D on: 03/23/2017 04:48 PM   Modules accepted: Orders

## 2017-04-16 ENCOUNTER — Other Ambulatory Visit: Payer: 59

## 2017-04-16 DIAGNOSIS — B20 Human immunodeficiency virus [HIV] disease: Secondary | ICD-10-CM

## 2017-04-16 LAB — T-HELPER CELL (CD4) - (RCID CLINIC ONLY)
CD4 T CELL HELPER: 33 % (ref 33–55)
CD4 T Cell Abs: 1290 /uL (ref 400–2700)

## 2017-04-19 LAB — HIV-1 RNA QUANT-NO REFLEX-BLD
HIV 1 RNA Quant: <20 copies/mL — AB
HIV-1 RNA Quant, Log: <1.3 Log copies/mL — AB

## 2017-05-03 ENCOUNTER — Encounter: Payer: Self-pay | Admitting: Infectious Disease

## 2017-05-03 ENCOUNTER — Ambulatory Visit (INDEPENDENT_AMBULATORY_CARE_PROVIDER_SITE_OTHER): Payer: 59 | Admitting: Infectious Disease

## 2017-05-03 ENCOUNTER — Ambulatory Visit: Payer: 59 | Admitting: Infectious Disease

## 2017-05-03 VITALS — BP 123/78 | HR 65 | Temp 98.4°F | Wt 163.0 lb

## 2017-05-03 DIAGNOSIS — Z113 Encounter for screening for infections with a predominantly sexual mode of transmission: Secondary | ICD-10-CM

## 2017-05-03 DIAGNOSIS — Z23 Encounter for immunization: Secondary | ICD-10-CM | POA: Diagnosis not present

## 2017-05-03 DIAGNOSIS — Z79899 Other long term (current) drug therapy: Secondary | ICD-10-CM | POA: Diagnosis not present

## 2017-05-03 DIAGNOSIS — F329 Major depressive disorder, single episode, unspecified: Secondary | ICD-10-CM

## 2017-05-03 DIAGNOSIS — B20 Human immunodeficiency virus [HIV] disease: Secondary | ICD-10-CM | POA: Diagnosis not present

## 2017-05-03 DIAGNOSIS — F32A Depression, unspecified: Secondary | ICD-10-CM

## 2017-05-03 NOTE — Progress Notes (Signed)
Chief complaint:  followup for HIV on meds Subjective:    Patient ID: Kenneth Mason, male    DOB: July 17, 1975, 41 y.o.   MRN: [id number]  HPI  41 year old man with HIV, recently started on complera started in January 2014 with perfect virological suppresion and healthy CD4, now on ODEFSEY. He is doing relatively well though at times he is a bit fatigued.  He is sending an FMLA paperwork to allow him to have time to come to his visits.  Lab Results  Component Value Date   HIV1RNAQUANT <20 DETECTED (A) 04/16/2017   HIV1RNAQUANT <20 NOT DETECTED 11/02/2016   HIV1RNAQUANT <20 04/21/2016     Lab Results  Component Value Date   CD4TABS 1,290 04/16/2017   CD4TABS 1,140 11/02/2016   CD4TABS 1,050 04/21/2016    His depression has responded well to counseling and he is no longer on SSRI.   Past Medical History:  Diagnosis Date  . Anal wart 11/21/2014  . Anxiety   . Benign rectal wall submucosal cyst s/p TEM partial proctectomy [id number]    . Depression   . Headache in front of head    q 2 weeks. No n/v, no photophobia. Responds to NSAIDs  . HIV disease ([place]) 05/13/2015  . HIV infection ([place])    dx'd HIV July 12; CD4 1100.  Presented with lymphadenopathy  . Insomnia 12/05/2015  . Rectal polyp 1999   polyp excised  . Screen for STD (sexually transmitted disease) 05/13/2015    Past Surgical History:  Procedure Laterality Date  . DENTAL SURGERY  2010   Wisdom Teeth   . EUS  04/09/2011   Procedure: LOWER ENDOSCOPIC ULTRASOUND (EUS);  Surgeon: Owens Loffler, MD;  Location: [facility];  Service: Endoscopy;  Laterality: N/A;  . FLEXIBLE SIGMOIDOSCOPY  04/09/2011   Procedure: FLEXIBLE SIGMOIDOSCOPY;  Surgeon: Owens Loffler, MD;  Location: [facility];  Service: Endoscopy;  Laterality: N/A;  . TRANSANAL ENDOSCOPIC MICROSURGERY  06/12/2011   Procedure: TRANSANAL ENDOSCOPIC MICROSURGERY;  Surgeon: Adin Hector, MD;  Location: [facility];  Service: General;  Laterality: N/A;  partial proctectomy  by TEM    Family History  Problem Relation Age of Onset  . Hypertension Mother   . Cancer Maternal Grandfather        unsure of type   . Liver cancer Paternal Grandmother   . Stroke Paternal Grandmother   . Heart disease Paternal Grandfather        CAD/MI-fatal      Social History   Socioeconomic History  . Marital status: Married    Spouse name: None  . Number of children: 2  . Years of education: 106  . Highest education level: None  Social Needs  . Financial resource strain: None  . Food insecurity - worry: None  . Food insecurity - inability: None  . Transportation needs - medical: None  . Transportation needs - non-medical: None  Occupational History  . Occupation: Therapist, art   Tobacco Use  . Smoking status: Never Smoker  . Smokeless tobacco: Never Used  Substance and Sexual Activity  . Alcohol use: Yes    Comment: rarely - once a month   . Drug use: No  . Sexual activity: Yes    Partners: Female, Male    Birth control/protection: Condom  Other Topics Concern  . None  Social History Narrative   HSG, 2 yrs college. Married '04 - seperated (2012). !son ' 2005; 1  dtr - '08. 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  Lives with children. Wife/ mother is abroad (aug '12).    No Known Allergies   Current Outpatient Medications:  .  ODEFSEY 200-25-25 MG TABS tablet, TAKE 1 TABLET BY MOUTH DAILY WITH BREAKFAST., Disp: 30 tablet, Rfl: 2 .  promethazine (PHENERGAN) 25 MG tablet, Take 1 tablet (25 mg total) by mouth every 8 (eight) hours as needed for nausea or vomiting. (Patient not taking: Reported on 05/06/2016), Disp: 15 tablet, Rfl: 0 .  traZODone (DESYREL) 50 MG tablet, Take 1 tablet (50 mg total) by mouth at bedtime. (Patient not taking: Reported on 05/06/2016), Disp: 30 tablet, Rfl: 11   Review of Systems  Constitutional: Negative for activity change, appetite change,  chills, diaphoresis, fatigue, fever and unexpected weight change.  HENT: Negative for congestion, mouth sores, rhinorrhea, sinus pressure, sneezing, sore throat and trouble swallowing.   Eyes: Negative for photophobia and visual disturbance.  Respiratory: Negative for cough, chest tightness, shortness of breath, wheezing and stridor.   Cardiovascular: Negative for chest pain, palpitations and leg swelling.  Gastrointestinal: Negative for abdominal distention, abdominal pain, anal bleeding, blood in stool, constipation, diarrhea, nausea and vomiting.  Genitourinary: Negative for difficulty urinating, discharge, dysuria, flank pain, genital sores, hematuria, penile pain, penile swelling, scrotal swelling and testicular pain.  Musculoskeletal: Negative for arthralgias, back pain, gait problem, joint swelling and myalgias.  Skin: Negative for color change, pallor, rash and wound.  Neurological: Negative for dizziness, tremors, weakness and light-headedness.  Hematological: Negative for adenopathy. Does not bruise/bleed easily.  Psychiatric/Behavioral: Negative for agitation, behavioral problems, confusion, decreased concentration and suicidal ideas.       Objective:   Physical Exam  Constitutional: He is oriented to person, place, and time. He appears well-developed and well-nourished.  HENT:  Head: Normocephalic and atraumatic.  Eyes: Conjunctivae and EOM are normal.  Neck: Normal range of motion. Neck supple.  Cardiovascular: Normal rate and regular rhythm.  Pulmonary/Chest: Effort normal. No respiratory distress. He has no wheezes.  Abdominal: Soft. He exhibits no distension.  Musculoskeletal: Normal range of motion. He exhibits no edema or tenderness.  Neurological: He is alert and oriented to person, place, and time.  Skin: Skin is warm and dry. No rash noted. No erythema. No pallor.  Psychiatric: He has a normal mood and affect. His behavior is normal. Judgment and thought content  normal.  Nursing note and vitals reviewed.       Assessment & Plan:  HIV: Continue ODEFSEY with chewed food   Depression: he stopped SSRI.  Revisit further next visit  I spent greater than 25 minutes with the patient including greater than 50% of time in face to face counsel of the patient and in coordination of his care, with again specific counseling on the need for food to increase acid in the stomach and achieve therapeutic levels of ropivacaine and the need to absolutely avoid proton pump inhibitors and H2 blockers and other drugs that would raise the gastric pH in the stomach.

## 2017-06-02 ENCOUNTER — Telehealth: Payer: Self-pay | Admitting: *Deleted

## 2017-06-02 NOTE — Telephone Encounter (Signed)
I am not a therapist or psychiatrist or counselor. If he wants these types of needs addressed then they would need to be validated by someone capable of so doing. I am in favor of life coaches but his employer may not be of such a belief and to my knowledge they are not likely covered in [place] and certainly I am not capable of making such a judgement call. Perhaps he could meet with someone in our clinic to substantiate these benefits. Perhaps soneone else more familiar with legality of FMLA and what is covered is better equipped to help him with this. This is outside of my scope of practice.

## 2017-06-02 NOTE — Telephone Encounter (Signed)
Patient called and advised he received his paperwork for his FMLA and we did not include his visits with his Life coach in [place]. He advised he goes 3-4 times a month and needs [place] to adjust his paperwork to cover those visits as well. Advised the patient we can only cover him for his visits to this office or hospital stays due to his HIV. Advised anything else is fraud and if we get audited we have to have visits here in our office that line up with his missed days due to his condition. Advised him to have his Life Coach to write him a note or due her own FMLA. He advised she is not licensed therapist and not sure she can do it. Reiterated to him that we can only deal with his HIV and associated visits on his FMLA. Patient was very insistent that I ask the doctor and advised him will do and give him a call back as soon as I get a response.

## 2017-06-03 NOTE — Telephone Encounter (Signed)
Called patient back with response from Dr Tommy Medal and had to leave a message for him to call the office back.

## 2017-06-08 NOTE — Telephone Encounter (Signed)
Patient informed that we can not amend his FMLA form to accomodate his life coach visits. He was not happy but he understands and will try to figure something else out.

## 2017-08-03 ENCOUNTER — Other Ambulatory Visit: Payer: Self-pay | Admitting: Infectious Disease

## 2017-08-03 DIAGNOSIS — B20 Human immunodeficiency virus [HIV] disease: Secondary | ICD-10-CM

## 2017-10-18 ENCOUNTER — Other Ambulatory Visit: Payer: 59

## 2017-11-01 ENCOUNTER — Ambulatory Visit: Payer: 59 | Admitting: Infectious Disease

## 2017-11-05 ENCOUNTER — Other Ambulatory Visit (HOSPITAL_COMMUNITY)
Admission: RE | Admit: 2017-11-05 | Discharge: 2017-11-05 | Disposition: A | Discharge status: Home / Self Care | Payer: 59 | Source: Ambulatory Visit | Attending: Infectious Disease | Admitting: Infectious Disease

## 2017-11-05 ENCOUNTER — Other Ambulatory Visit: Payer: 59

## 2017-11-05 DIAGNOSIS — Z113 Encounter for screening for infections with a predominantly sexual mode of transmission: Secondary | ICD-10-CM | POA: Diagnosis not present

## 2017-11-05 DIAGNOSIS — B20 Human immunodeficiency virus [HIV] disease: Secondary | ICD-10-CM | POA: Insufficient documentation

## 2017-11-05 DIAGNOSIS — Z79899 Other long term (current) drug therapy: Secondary | ICD-10-CM | POA: Diagnosis not present

## 2017-11-05 LAB — T-HELPER CELL (CD4) - (RCID CLINIC ONLY)
CD4 % Helper T Cell: 31 % — ABNORMAL LOW (ref 33–55)
CD4 T CELL ABS: 920 /uL (ref 400–2700)

## 2017-11-08 ENCOUNTER — Ambulatory Visit: Payer: 59 | Admitting: Infectious Disease

## 2017-11-08 LAB — CBC WITH DIFFERENTIAL/PLATELET
BASOS PCT: 0.3 %
Basophils Absolute: 20 cells/uL (ref 0–200)
EOS ABS: 59 {cells}/uL (ref 15–500)
Eosinophils Relative: 0.9 %
HCT: 45.4 % (ref 38.5–50.0)
Hemoglobin: 15.8 g/dL (ref 13.2–17.1)
Lymphs Abs: 3102 cells/uL (ref 850–3900)
MCH: 29.2 pg (ref 27.0–33.0)
MCHC: 34.8 g/dL (ref 32.0–36.0)
MCV: 83.8 fL (ref 80.0–100.0)
MONOS PCT: 7.4 %
MPV: 9.4 fL (ref 7.5–12.5)
NEUTROS PCT: 44.4 %
Neutro Abs: 2930 cells/uL (ref 1500–7800)
Platelets: 222 10*3/uL (ref 140–400)
RBC: 5.42 10*6/uL (ref 4.20–5.80)
RDW: 14.7 % (ref 11.0–15.0)
Total Lymphocyte: 47 %
WBC: 6.6 10*3/uL (ref 3.8–10.8)
WBCMIX: 488 {cells}/uL (ref 200–950)

## 2017-11-08 LAB — COMPLETE METABOLIC PANEL WITH GFR
AG Ratio: 1.6 (calc) (ref 1.0–2.5)
ALBUMIN MSPROF: 4.2 g/dL (ref 3.6–5.1)
ALKALINE PHOSPHATASE (APISO): 64 U/L (ref 40–115)
ALT: 22 U/L (ref 9–46)
AST: 23 U/L (ref 10–40)
BILIRUBIN TOTAL: 0.9 mg/dL (ref 0.2–1.2)
BUN: 13 mg/dL (ref 7–25)
CHLORIDE: 104 mmol/L (ref 98–110)
CO2: 28 mmol/L (ref 20–32)
Calcium: 9.5 mg/dL (ref 8.6–10.3)
Creat: 1.35 mg/dL (ref 0.60–1.35)
GFR, Est African American: 76 mL/min/{1.73_m2} (ref >=60)
GFR, Est Non African American: 65 mL/min/{1.73_m2} (ref >=60)
GLOBULIN: 2.6 g/dL (ref 1.9–3.7)
Glucose, Bld: 83 mg/dL (ref 65–99)
POTASSIUM: 4.2 mmol/L (ref 3.5–5.3)
Sodium: 142 mmol/L (ref 135–146)
Total Protein: 6.8 g/dL (ref 6.1–8.1)

## 2017-11-08 LAB — LIPID PANEL
CHOLESTEROL: 144 mg/dL (ref <200)
HDL: 53 mg/dL (ref >40)
LDL CHOLESTEROL (CALC): 80 mg/dL
Non-HDL Cholesterol (Calc): 91 mg/dL (calc) (ref <130)
TRIGLYCERIDES: 39 mg/dL (ref <150)
Total CHOL/HDL Ratio: 2.7 (calc) (ref <5.0)

## 2017-11-08 LAB — URINE CYTOLOGY ANCILLARY ONLY
Chlamydia: NEGATIVE
Neisseria Gonorrhea: NEGATIVE

## 2017-11-08 LAB — RPR: RPR Ser Ql: NONREACTIVE

## 2017-11-09 LAB — HIV-1 RNA QUANT-NO REFLEX-BLD
HIV 1 RNA Quant: <20 copies/mL
HIV-1 RNA QUANT, LOG: NOT DETECTED {Log_copies}/mL

## 2017-12-07 ENCOUNTER — Encounter: Payer: Self-pay | Admitting: Infectious Disease

## 2017-12-07 ENCOUNTER — Ambulatory Visit (INDEPENDENT_AMBULATORY_CARE_PROVIDER_SITE_OTHER): Payer: 59 | Admitting: Infectious Disease

## 2017-12-07 VITALS — BP 130/83 | HR 83 | Temp 97.7°F | Wt 166.0 lb

## 2017-12-07 DIAGNOSIS — Z113 Encounter for screening for infections with a predominantly sexual mode of transmission: Secondary | ICD-10-CM | POA: Diagnosis not present

## 2017-12-07 DIAGNOSIS — A630 Anogenital (venereal) warts: Secondary | ICD-10-CM

## 2017-12-07 DIAGNOSIS — Z23 Encounter for immunization: Secondary | ICD-10-CM

## 2017-12-07 DIAGNOSIS — B20 Human immunodeficiency virus [HIV] disease: Secondary | ICD-10-CM | POA: Diagnosis not present

## 2017-12-07 HISTORY — DX: Encounter for immunization: Z23

## 2017-12-07 MED ORDER — EMTRICITAB-RILPIVIR-TENOFOV AF 200-25-25 MG PO TABS: 1.0000 | ORAL_TABLET | Freq: Every day | ORAL | 11 refills | Status: DC

## 2017-12-07 NOTE — Progress Notes (Signed)
Chief complaint:  followup for HIV on meds Subjective:    Patient ID: Kenneth Mason, male    DOB: Jul 31, 1975, 42 y.o.   MRN: [id number]  HPI  42 year old man with HIV, recently started on complera started in January 2014 with perfect virological suppresion and healthy CD4, now on ODEFSEY. .  Lab Results  Component Value Date   HIV1RNAQUANT <20 NOT DETECTED 11/05/2017   HIV1RNAQUANT <20 DETECTED (A) 04/16/2017   HIV1RNAQUANT <20 NOT DETECTED 11/02/2016     Lab Results  Component Value Date   CD4TABS 920 11/05/2017   CD4TABS 1,290 04/16/2017   CD4TABS 1,140 11/02/2016   He states his depression is well managed currently without medications and he is seeing a psychiatrist but without being prescribed any medications.  He states most of his depression was related to problems with job and other life stressors.  He has had anal warts but has not had anal Pap smear or HRA in recent years.  I have referred him to the anchor study at [facility].  He had quite a lot of questions about other antiretroviral combinations besides what he is currently on.  Past Medical History:  Diagnosis Date  . Anal wart 11/21/2014  . Anxiety   . Benign rectal wall submucosal cyst s/p TEM partial proctectomy [id number]    . Depression   . Headache in front of head    q 2 weeks. No n/v, no photophobia. Responds to NSAIDs  . HIV disease ([place]) 05/13/2015  . HIV infection ([place])    dx'd HIV July 12; CD4 1100.  Presented with lymphadenopathy  . Insomnia 12/05/2015  . Rectal polyp 1999   polyp excised  . Screen for STD (sexually transmitted disease) 05/13/2015    Past Surgical History:  Procedure Laterality Date  . DENTAL SURGERY  2010   Wisdom Teeth   . EUS  04/09/2011   Procedure: LOWER ENDOSCOPIC ULTRASOUND (EUS);  Surgeon: Owens Loffler, MD;  Location: [facility];  Service: Endoscopy;  Laterality: N/A;  . FLEXIBLE SIGMOIDOSCOPY  04/09/2011   Procedure: FLEXIBLE SIGMOIDOSCOPY;  Surgeon: Owens Loffler, MD;  Location: [facility];  Service: Endoscopy;  Laterality: N/A;  . TRANSANAL ENDOSCOPIC MICROSURGERY  06/12/2011   Procedure: TRANSANAL ENDOSCOPIC MICROSURGERY;  Surgeon: Adin Hector, MD;  Location: [facility];  Service: General;  Laterality: N/A;  partial proctectomy by TEM    Family History  Problem Relation Age of Onset  . Hypertension Mother   . Cancer Maternal Grandfather        unsure of type   . Liver cancer Paternal Grandmother   . Stroke Paternal Grandmother   . Heart disease Paternal Grandfather        CAD/MI-fatal      Social History   Socioeconomic History  . Marital status: Married    Spouse name: Not on file  . Number of children: 2  . Years of education: 76  . Highest education level: Not on file  Occupational History  . Occupation: Therapist, art   Social Needs  . Financial resource strain: Not on file  . Food insecurity:    Worry: Not on file    Inability: Not on file  . Transportation needs:    Medical: Not on file    Non-medical: Not on file  Tobacco Use  . Smoking status: Never Smoker  . Smokeless tobacco: Never Used  Substance and Sexual Activity  . Alcohol use: Yes    Comment: rarely - once a  month   . Drug use: No  . Sexual activity: Yes    Partners: Female, Male    Birth control/protection: Condom  Lifestyle  . Physical activity:    Days per week: Not on file    Minutes per session: Not on file  . Stress: Not on file  Relationships  . Social connections:    Talks on phone: Not on file    Gets together: Not on file    Attends religious service: Not on file    Active member of club or organization: Not on file    Attends meetings of clubs or organizations: Not on file    Relationship status: Not on file  Other Topics Concern  . Not on file  Social History Narrative   HSG, 2 yrs college. Married '04 - seperated (2012). !son ' 2005; 1  dtr - '08. 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  Lives with children. Wife/ mother is abroad (aug '12).    No Known Allergies   Current Outpatient Medications:  .  ODEFSEY 200-25-25 MG TABS tablet, TAKE 1 TABLET BY MOUTH DAILY WITH BREAKFAST., Disp: 30 tablet, Rfl: 3 .  promethazine (PHENERGAN) 25 MG tablet, Take 1 tablet (25 mg total) by mouth every 8 (eight) hours as needed for nausea or vomiting. (Patient not taking: Reported on 05/06/2016), Disp: 15 tablet, Rfl: 0 .  traZODone (DESYREL) 50 MG tablet, Take 1 tablet (50 mg total) by mouth at bedtime. (Patient not taking: Reported on 05/06/2016), Disp: 30 tablet, Rfl: 11   Review of Systems  Constitutional: Negative for activity change, appetite change, chills, diaphoresis, fatigue, fever and unexpected weight change.  HENT: Negative for congestion, mouth sores, rhinorrhea, sinus pressure, sneezing, sore throat and trouble swallowing.   Eyes: Negative for photophobia and visual disturbance.  Respiratory: Negative for cough, chest tightness, shortness of breath, wheezing and stridor.   Cardiovascular: Negative for chest pain, palpitations and leg swelling.  Gastrointestinal: Negative for abdominal distention, abdominal pain, anal bleeding, blood in stool, constipation, diarrhea, nausea and vomiting.  Genitourinary: Negative for difficulty urinating, discharge, dysuria, flank pain, genital sores, hematuria, penile pain, penile swelling, scrotal swelling and testicular pain.  Musculoskeletal: Negative for arthralgias, back pain, gait problem, joint swelling and myalgias.  Skin: Negative for color change, pallor, rash and wound.  Neurological: Negative for dizziness, tremors, weakness and light-headedness.  Hematological: Negative for adenopathy. Does not bruise/bleed easily.  Psychiatric/Behavioral: Negative for agitation, behavioral problems, confusion, decreased concentration and suicidal ideas.       Objective:   Physical Exam   Constitutional: He is oriented to person, place, and time. He appears well-developed and well-nourished.  HENT:  Head: Normocephalic and atraumatic.  Eyes: Conjunctivae and EOM are normal.  Neck: Normal range of motion. Neck supple.  Cardiovascular: Normal rate and regular rhythm.  Pulmonary/Chest: Effort normal. No respiratory distress. He has no wheezes.  Abdominal: Soft. He exhibits no distension.  Musculoskeletal: Normal range of motion. He exhibits no edema or tenderness.  Neurological: He is alert and oriented to person, place, and time.  Skin: Skin is warm and dry. No rash noted. No erythema. No pallor.  Psychiatric: He has a normal mood and affect. His behavior is normal. Judgment and thought content normal.  Nursing note and vitals reviewed.       Assessment & Plan:  HIV: Continue ODEFSEY with chewed food.  We discussed all the other available single  tablet regimens and [name] was quite comfortable remaining on Odefsey.  Depression: he stopped SSRI, he is doing very well  Anal warts: I am going to refer him to [facility]'s anchor study.   I spent greater than 25 minutes with the patient including greater than 50% of time in face to face counsel of the patient regarding other single tablet regimens that would be possibilities for him including BIKTARVY, TRIUMEQ, JULUCA, [place], SYMTUZA, [place] discussing pros and cons of each of these regimens rationale behind DHHS guidelines and recommendations and in coordination of his care.

## 2017-12-07 NOTE — Patient Instructions (Signed)
Take a look at [facility] / Infectious Diseases  [facility] [address], [place] [address]  Directions  Tel: [phone]  Fax: [phone]  JOIN STUDY  The Anchor Study  @[email or web address]   [facility] Anchor Study

## 2017-12-13 ENCOUNTER — Other Ambulatory Visit: Payer: Self-pay | Admitting: Infectious Disease

## 2017-12-13 DIAGNOSIS — B20 Human immunodeficiency virus [HIV] disease: Secondary | ICD-10-CM

## 2018-11-28 ENCOUNTER — Telehealth: Payer: Self-pay | Admitting: Infectious Disease

## 2018-11-28 NOTE — Telephone Encounter (Signed)
COVID-19 Pre-Screening Questions: 11/28/18 ° °Do you currently have a fever (>100 °F), chills or unexplained body aches? NO ° °Are you currently experiencing new cough, shortness of breath, sore throat, runny nose?NO  °•  °Have you recently travelled outside the state of [place] in the last 14 days?NO °•  °Have you been in contact with someone that is currently pending confirmation of Covid19 testing or has been confirmed to have the Covid19 virus?  NO ° °**If the patient answers NO to ALL questions -  advise the patient to please call the clinic before coming to the office should any symptoms develop.  ° ° ° ° °

## 2018-11-29 ENCOUNTER — Other Ambulatory Visit (HOSPITAL_COMMUNITY)
Admission: RE | Admit: 2018-11-29 | Discharge: 2018-11-29 | Disposition: A | Discharge status: Home / Self Care | Payer: 59 | Source: Ambulatory Visit | Attending: Infectious Disease | Admitting: Infectious Disease

## 2018-11-29 ENCOUNTER — Other Ambulatory Visit: Payer: 59

## 2018-11-29 ENCOUNTER — Other Ambulatory Visit: Payer: Self-pay

## 2018-11-29 DIAGNOSIS — Z23 Encounter for immunization: Secondary | ICD-10-CM | POA: Insufficient documentation

## 2018-11-29 DIAGNOSIS — B20 Human immunodeficiency virus [HIV] disease: Secondary | ICD-10-CM | POA: Diagnosis present

## 2018-11-29 DIAGNOSIS — Z113 Encounter for screening for infections with a predominantly sexual mode of transmission: Secondary | ICD-10-CM | POA: Insufficient documentation

## 2018-11-30 LAB — T-HELPER CELL (CD4) - (RCID CLINIC ONLY)
CD4 % Helper T Cell: 31 % — ABNORMAL LOW (ref 33–65)
CD4 T Cell Abs: 1111 /uL (ref 400–1790)

## 2018-12-01 LAB — URINE CYTOLOGY ANCILLARY ONLY
Chlamydia: NEGATIVE
Neisseria Gonorrhea: NEGATIVE

## 2018-12-08 LAB — CBC WITH DIFFERENTIAL/PLATELET
Absolute Monocytes: 403 cells/uL (ref 200–950)
Basophils Absolute: 13 cells/uL (ref 0–200)
Basophils Relative: 0.2 %
Eosinophils Absolute: 58 cells/uL (ref 15–500)
Eosinophils Relative: 0.9 %
HCT: 46.9 % (ref 38.5–50.0)
Hemoglobin: 16.5 g/dL (ref 13.2–17.1)
Lymphs Abs: 3539 cells/uL (ref 850–3900)
MCH: 30.2 pg (ref 27.0–33.0)
MCHC: 35.2 g/dL (ref 32.0–36.0)
MCV: 85.7 fL (ref 80.0–100.0)
MPV: 9.5 fL (ref 7.5–12.5)
Monocytes Relative: 6.3 %
Neutro Abs: 2387 cells/uL (ref 1500–7800)
Neutrophils Relative %: 37.3 %
Platelets: 227 10*3/uL (ref 140–400)
RBC: 5.47 10*6/uL (ref 4.20–5.80)
RDW: 14.2 % (ref 11.0–15.0)
Total Lymphocyte: 55.3 %
WBC: 6.4 10*3/uL (ref 3.8–10.8)

## 2018-12-08 LAB — COMPLETE METABOLIC PANEL WITH GFR
AG Ratio: 1.7 (calc) (ref 1.0–2.5)
ALT: 20 U/L (ref 9–46)
AST: 19 U/L (ref 10–40)
Albumin: 4.4 g/dL (ref 3.6–5.1)
Alkaline phosphatase (APISO): 71 U/L (ref 36–130)
BUN/Creatinine Ratio: 9 (calc) (ref 6–22)
BUN: 13 mg/dL (ref 7–25)
CO2: 31 mmol/L (ref 20–32)
Calcium: 9.8 mg/dL (ref 8.6–10.3)
Chloride: 105 mmol/L (ref 98–110)
Creat: 1.37 mg/dL — ABNORMAL HIGH (ref 0.60–1.35)
GFR, Est African American: 74 mL/min/{1.73_m2} (ref >=60)
GFR, Est Non African American: 64 mL/min/{1.73_m2} (ref >=60)
Globulin: 2.6 g/dL (calc) (ref 1.9–3.7)
Glucose, Bld: 66 mg/dL (ref 65–99)
Potassium: 4.5 mmol/L (ref 3.5–5.3)
Sodium: 141 mmol/L (ref 135–146)
Total Bilirubin: 0.6 mg/dL (ref 0.2–1.2)
Total Protein: 7 g/dL (ref 6.1–8.1)

## 2018-12-08 LAB — RPR: RPR Ser Ql: NONREACTIVE

## 2018-12-08 LAB — LIPID PANEL
Cholesterol: 164 mg/dL (ref <200)
HDL: 45 mg/dL (ref >=40)
LDL Cholesterol (Calc): 102 mg/dL (calc) — ABNORMAL HIGH
Non-HDL Cholesterol (Calc): 119 mg/dL (calc) (ref <130)
Total CHOL/HDL Ratio: 3.6 (calc) (ref <5.0)
Triglycerides: 82 mg/dL (ref <150)

## 2018-12-08 LAB — HIV-1 RNA QUANT-NO REFLEX-BLD
HIV 1 RNA Quant: <20 copies/mL
HIV-1 RNA Quant, Log: <1.3 Log copies/mL

## 2018-12-13 ENCOUNTER — Encounter: Payer: 59 | Admitting: Infectious Disease

## 2018-12-22 ENCOUNTER — Telehealth: Payer: Self-pay | Admitting: Infectious Disease

## 2018-12-22 NOTE — Telephone Encounter (Signed)
COVID-19 Pre-Screening Questions:12/22/18   Do you currently have a fever (>100 F), chills or unexplained body aches? NO   Are you currently experiencing new cough, shortness of breath, sore throat, runny nose? NO    Have you recently travelled outside the state of [place] in the last 14 days? NO   Have you been in contact with someone that is currently pending confirmation of Covid19 testing or has been confirmed to have the [place] virus?  NO  **If the patient answers NO to ALL questions -  advise the patient to please call the clinic before coming to the office should any symptoms develop.

## 2018-12-26 ENCOUNTER — Other Ambulatory Visit: Payer: Self-pay

## 2018-12-26 ENCOUNTER — Encounter: Payer: Self-pay | Admitting: Infectious Disease

## 2018-12-26 ENCOUNTER — Ambulatory Visit (INDEPENDENT_AMBULATORY_CARE_PROVIDER_SITE_OTHER): Payer: 59 | Admitting: Infectious Disease

## 2018-12-26 VITALS — BP 123/72 | HR 57 | Temp 97.9°F

## 2018-12-26 DIAGNOSIS — B20 Human immunodeficiency virus [HIV] disease: Secondary | ICD-10-CM

## 2018-12-26 DIAGNOSIS — F329 Major depressive disorder, single episode, unspecified: Secondary | ICD-10-CM | POA: Diagnosis not present

## 2018-12-26 DIAGNOSIS — Z113 Encounter for screening for infections with a predominantly sexual mode of transmission: Secondary | ICD-10-CM | POA: Diagnosis not present

## 2018-12-26 DIAGNOSIS — Z23 Encounter for immunization: Secondary | ICD-10-CM | POA: Diagnosis not present

## 2018-12-26 DIAGNOSIS — F32A Depression, unspecified: Secondary | ICD-10-CM

## 2018-12-26 MED ORDER — ODEFSEY 200-25-25 MG PO TABS: 1.0000 | ORAL_TABLET | Freq: Every day | ORAL | 11 refills | Status: DC

## 2018-12-26 NOTE — Progress Notes (Signed)
Chief complaint:  followup for HIV on meds Subjective:    Patient ID: Kenneth Mason, male    DOB: 1976/05/17, 43 y.o.   MRN: [id number]  HPI  43 year old man with HIV, started on complera started in January 2014 with perfect virological suppresion and healthy CD4, now on ODEFSEY. Marland Kitchen  He is doing quite well and without complaints today.  He did tell me that he recently was in a relationship with someone who he wanted to have sex relations with but the person did not want to have sex with him even though have not was clear about his status and that he was undetectable and even though he conveyed the idea of undetectable equals on transmissible to his friend.  We had quite a long discussion about you because you about risk with sexual intercourse and how antiviral therapy now was rendered people living with HIV and infectious to others and preexposure prophylaxis allows those not infected with HIV to prevent himself getting HIV those not well controlled.  He is also in the anchor study at [facility].      Past Medical History:  Diagnosis Date  . Anal wart 11/21/2014  . Anxiety   . Benign rectal wall submucosal cyst s/p TEM partial proctectomy [id number]    . Depression   . Headache in front of head    q 2 weeks. No n/v, no photophobia. Responds to NSAIDs  . HIV disease ([place]) 05/13/2015  . HIV infection ([place])    dx'd HIV July 12; CD4 1100.  Presented with lymphadenopathy  . Insomnia 12/05/2015  . Need for prophylactic vaccination against Streptococcus pneumoniae (pneumococcus) 12/07/2017  . Rectal polyp 1999   polyp excised  . Screen for STD (sexually transmitted disease) 05/13/2015    Past Surgical History:  Procedure Laterality Date  . DENTAL SURGERY  2010   Wisdom Teeth   . EUS  04/09/2011   Procedure: LOWER ENDOSCOPIC ULTRASOUND (EUS);  Surgeon: Owens Loffler, MD;  Location: [facility];  Service: Endoscopy;  Laterality: N/A;  . FLEXIBLE SIGMOIDOSCOPY  04/09/2011   Procedure:  FLEXIBLE SIGMOIDOSCOPY;  Surgeon: Owens Loffler, MD;  Location: [facility];  Service: Endoscopy;  Laterality: N/A;  . TRANSANAL ENDOSCOPIC MICROSURGERY  06/12/2011   Procedure: TRANSANAL ENDOSCOPIC MICROSURGERY;  Surgeon: Adin Hector, MD;  Location: [facility];  Service: General;  Laterality: N/A;  partial proctectomy by TEM    Family History  Problem Relation Age of Onset  . Hypertension Mother   . Cancer Maternal Grandfather        unsure of type   . Liver cancer Paternal Grandmother   . Stroke Paternal Grandmother   . Heart disease Paternal Grandfather        CAD/MI-fatal      Social History   Socioeconomic History  . Marital status: Married    Spouse name: Not on file  . Number of children: 2  . Years of education: 31  . Highest education level: Not on file  Occupational History  . Occupation: Therapist, art   Social Needs  . Financial resource strain: Not on file  . Food insecurity    Worry: Not on file    Inability: Not on file  . Transportation needs    Medical: Not on file    Non-medical: Not on file  Tobacco Use  . Smoking status: Never Smoker  . Smokeless tobacco: Never Used  Substance and Sexual Activity  . Alcohol use: Yes    Comment: rarely -  once a month   . Drug use: No  . Sexual activity: Yes    Partners: Female, Male    Birth control/protection: Condom  Lifestyle  . Physical activity    Days per week: Not on file    Minutes per session: Not on file  . Stress: Not on file  Relationships  . Social Herbalist on phone: Not on file    Gets together: Not on file    Attends religious service: Not on file    Active member of club or organization: Not on file    Attends meetings of clubs or organizations: Not on file    Relationship status: Not on file  Other Topics Concern  . Not on file  Social History Narrative   HSG, 2 yrs college. Married '04 - seperated (2012). !son ' 2005; 1  dtr - '08. 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  Lives with children. Wife/ mother is abroad (aug '12).    No Known Allergies   Current Outpatient Medications:  .  emtricitabine-rilpivir-tenofovir AF (ODEFSEY) 200-25-25 MG TABS tablet, Take 1 tablet by mouth daily with breakfast., Disp: 30 tablet, Rfl: 11   Review of Systems  Constitutional: Negative for activity change, appetite change, chills, diaphoresis, fatigue, fever and unexpected weight change.  HENT: Negative for congestion, mouth sores, rhinorrhea, sinus pressure, sneezing, sore throat and trouble swallowing.   Eyes: Negative for photophobia and visual disturbance.  Respiratory: Negative for cough, chest tightness, shortness of breath, wheezing and stridor.   Cardiovascular: Negative for chest pain, palpitations and leg swelling.  Gastrointestinal: Negative for abdominal distention, abdominal pain, anal bleeding, blood in stool, constipation, diarrhea, nausea and vomiting.  Endocrine: Negative for cold intolerance.  Genitourinary: Negative for difficulty urinating, discharge, dysuria, flank pain, genital sores, hematuria, penile pain, penile swelling, scrotal swelling and testicular pain.  Musculoskeletal: Negative for arthralgias, back pain, gait problem, joint swelling and myalgias.  Skin: Negative for color change, pallor, rash and wound.  Neurological: Negative for dizziness, tremors, weakness and light-headedness.  Hematological: Negative for adenopathy. Does not bruise/bleed easily.  Psychiatric/Behavioral: Negative for agitation, behavioral problems, confusion, decreased concentration and suicidal ideas.       Objective:   Physical Exam  Constitutional: He is oriented to person, place, and time. He appears well-developed and well-nourished.  HENT:  Head: Normocephalic and atraumatic.  Eyes: Conjunctivae and EOM are normal.  Neck: Normal range of motion. Neck supple.   Cardiovascular: Normal rate and regular rhythm.  Pulmonary/Chest: Effort normal. No respiratory distress. He has no wheezes.  Abdominal: Soft. He exhibits no distension.  Musculoskeletal: Normal range of motion.        General: No tenderness or edema.  Neurological: He is alert and oriented to person, place, and time.  Skin: Skin is warm and dry. No rash noted. No erythema. No pallor.  Psychiatric: He has a normal mood and affect. His behavior is normal. Judgment and thought content normal.  Nursing note and vitals reviewed.       Assessment & Plan:  HIV: Continue ODEFSEY with chewed food.    Depression: he stopped SSRI, he is doing very well  Anal warts followed at [facility] anchor study

## 2019-11-24 ENCOUNTER — Other Ambulatory Visit: Payer: 59

## 2019-12-12 ENCOUNTER — Other Ambulatory Visit: Payer: 59

## 2019-12-14 ENCOUNTER — Other Ambulatory Visit: Payer: Self-pay

## 2019-12-14 ENCOUNTER — Other Ambulatory Visit: Payer: BC Managed Care – PPO

## 2019-12-14 DIAGNOSIS — Z113 Encounter for screening for infections with a predominantly sexual mode of transmission: Secondary | ICD-10-CM

## 2019-12-14 DIAGNOSIS — B20 Human immunodeficiency virus [HIV] disease: Secondary | ICD-10-CM

## 2019-12-14 DIAGNOSIS — F32A Depression, unspecified: Secondary | ICD-10-CM

## 2019-12-14 NOTE — Addendum Note (Signed)
Addended by: Dolan Amen D on: 12/14/2019 08:36 AM   Modules accepted: Orders

## 2019-12-15 LAB — T-HELPER CELL (CD4) - (RCID CLINIC ONLY)
CD4 % Helper T Cell: 28 % — ABNORMAL LOW (ref 33–65)
CD4 T Cell Abs: 952 /uL (ref 400–1790)

## 2019-12-16 LAB — CBC WITH DIFFERENTIAL/PLATELET
Absolute Monocytes: 681 cells/uL (ref 200–950)
Basophils Absolute: 33 cells/uL (ref 0–200)
Basophils Relative: 0.4 %
Eosinophils Absolute: 41 cells/uL (ref 15–500)
Eosinophils Relative: 0.5 %
HCT: 46.3 % (ref 38.5–50.0)
Hemoglobin: 15.9 g/dL (ref 13.2–17.1)
Lymphs Abs: 3641 cells/uL (ref 850–3900)
MCH: 29.8 pg (ref 27.0–33.0)
MCHC: 34.3 g/dL (ref 32.0–36.0)
MCV: 86.7 fL (ref 80.0–100.0)
MPV: 9.5 fL (ref 7.5–12.5)
Monocytes Relative: 8.3 %
Neutro Abs: 3805 cells/uL (ref 1500–7800)
Neutrophils Relative %: 46.4 %
Platelets: 236 10*3/uL (ref 140–400)
RBC: 5.34 10*6/uL (ref 4.20–5.80)
RDW: 14.5 % (ref 11.0–15.0)
Total Lymphocyte: 44.4 %
WBC: 8.2 10*3/uL (ref 3.8–10.8)

## 2019-12-16 LAB — COMPLETE METABOLIC PANEL WITH GFR
AG Ratio: 1.7 (calc) (ref 1.0–2.5)
ALT: 33 U/L (ref 9–46)
AST: 24 U/L (ref 10–40)
Albumin: 4.5 g/dL (ref 3.6–5.1)
Alkaline phosphatase (APISO): 75 U/L (ref 36–130)
BUN: 13 mg/dL (ref 7–25)
CO2: 28 mmol/L (ref 20–32)
Calcium: 9.7 mg/dL (ref 8.6–10.3)
Chloride: 104 mmol/L (ref 98–110)
Creat: 1.21 mg/dL (ref 0.60–1.35)
GFR, Est African American: 85 mL/min/{1.73_m2} (ref >=60)
GFR, Est Non African American: 73 mL/min/{1.73_m2} (ref >=60)
Globulin: 2.7 g/dL (calc) (ref 1.9–3.7)
Glucose, Bld: 84 mg/dL (ref 65–99)
Potassium: 4.1 mmol/L (ref 3.5–5.3)
Sodium: 139 mmol/L (ref 135–146)
Total Bilirubin: 0.5 mg/dL (ref 0.2–1.2)
Total Protein: 7.2 g/dL (ref 6.1–8.1)

## 2019-12-16 LAB — LIPID PANEL
Cholesterol: 188 mg/dL (ref <200)
HDL: 53 mg/dL (ref >=40)
LDL Cholesterol (Calc): 115 mg/dL (calc) — ABNORMAL HIGH
Non-HDL Cholesterol (Calc): 135 mg/dL (calc) — ABNORMAL HIGH (ref <130)
Total CHOL/HDL Ratio: 3.5 (calc) (ref <5.0)
Triglycerides: 95 mg/dL (ref <150)

## 2019-12-16 LAB — HIV-1 RNA QUANT-NO REFLEX-BLD
HIV 1 RNA Quant: <20 copies/mL
HIV-1 RNA Quant, Log: <1.3 Log copies/mL

## 2019-12-16 LAB — RPR: RPR Ser Ql: NONREACTIVE

## 2019-12-21 ENCOUNTER — Other Ambulatory Visit: Payer: Self-pay | Admitting: Infectious Disease

## 2019-12-21 DIAGNOSIS — B20 Human immunodeficiency virus [HIV] disease: Secondary | ICD-10-CM

## 2019-12-29 ENCOUNTER — Telehealth: Payer: Self-pay

## 2019-12-29 NOTE — Telephone Encounter (Signed)
Patient called and request sooner appointment with Dr. Tommy Medal. No sooner appointment available.  Patient states he has a "bump" on his penis. Patient denies pain. Advised patient to go  To urgent care to be evaluated. Patient agrees. Eugenia Mcalpine

## 2020-01-01 ENCOUNTER — Encounter: Payer: 59 | Admitting: Infectious Disease

## 2020-01-11 ENCOUNTER — Other Ambulatory Visit (HOSPITAL_COMMUNITY)
Admission: RE | Admit: 2020-01-11 | Discharge: 2020-01-11 | Disposition: A | Discharge status: Home / Self Care | Payer: BC Managed Care – PPO | Source: Ambulatory Visit | Attending: Infectious Disease | Admitting: Infectious Disease

## 2020-01-11 ENCOUNTER — Encounter: Payer: Self-pay | Admitting: Infectious Disease

## 2020-01-11 ENCOUNTER — Ambulatory Visit (INDEPENDENT_AMBULATORY_CARE_PROVIDER_SITE_OTHER): Payer: BC Managed Care – PPO | Admitting: Infectious Disease

## 2020-01-11 ENCOUNTER — Other Ambulatory Visit: Payer: Self-pay

## 2020-01-11 VITALS — BP 137/91 | HR 57 | Temp 98.3°F | Wt 168.0 lb

## 2020-01-11 DIAGNOSIS — A630 Anogenital (venereal) warts: Secondary | ICD-10-CM | POA: Diagnosis not present

## 2020-01-11 DIAGNOSIS — B20 Human immunodeficiency virus [HIV] disease: Secondary | ICD-10-CM | POA: Diagnosis present

## 2020-01-11 DIAGNOSIS — K625 Hemorrhage of anus and rectum: Secondary | ICD-10-CM

## 2020-01-11 DIAGNOSIS — R198 Other specified symptoms and signs involving the digestive system and abdomen: Secondary | ICD-10-CM

## 2020-01-11 DIAGNOSIS — Z113 Encounter for screening for infections with a predominantly sexual mode of transmission: Secondary | ICD-10-CM

## 2020-01-11 DIAGNOSIS — L80 Vitiligo: Secondary | ICD-10-CM

## 2020-01-11 NOTE — Progress Notes (Signed)
Chief complaint: Concern for thrush in his oropharynx, malodorous discharge from his rectum hypopigmented lesions on his hands with scaling skin and testicular cyst Subjective:    Patient ID: Kenneth Mason, male    DOB: Mar 29, 1976, 44 y.o.   MRN: [id number]  HPI  44 year old man with HIV, started on complera started in January 2014 with perfect virological suppresion and healthy CD4, now on ODEFSEY. Kenneth Mason has noticed sudden hypopigmentation of his skin on his arms chest back that he thought was pityriasis which he had had before and he began applying antifungal agents to his neck.  He also has an area where he apparently had an exudate on the inside of his buccal mucosa on the left which she thought was thrush.  He had some peeling of his skin which become dry.  He also had some testicular pain and was seen at [facility] where an ultrasound showed a cyst over the left epididymis.  He has noticed also malodorous discharge from his rectum.  He has not started any new medications that he is on a supplement that has been on chronically in addition to his [place].  I suggested empiric treatment for STIs but he preferred to be tested first rather than do so also suggested referral to dermatology for his hypopigmented lesions       Past Medical History:  Diagnosis Date  . Anal wart 11/21/2014  . Anxiety   . Benign rectal wall submucosal cyst s/p TEM partial proctectomy [id number]    . Depression   . Headache in front of head    q 2 weeks. No n/v, no photophobia. Responds to NSAIDs  . HIV disease ([place]) 05/13/2015  . HIV infection ([place])    dx'd HIV July 12; CD4 1100.  Presented with lymphadenopathy  . Insomnia 12/05/2015  . Need for prophylactic vaccination against Streptococcus pneumoniae (pneumococcus) 12/07/2017  . Rectal polyp 1999   polyp excised  . Screen for STD (sexually transmitted disease) 05/13/2015    Past Surgical History:  Procedure Laterality Date  . DENTAL SURGERY  2010    Wisdom Teeth   . EUS  04/09/2011   Procedure: LOWER ENDOSCOPIC ULTRASOUND (EUS);  Surgeon: Owens Loffler, MD;  Location: [facility];  Service: Endoscopy;  Laterality: N/A;  . FLEXIBLE SIGMOIDOSCOPY  04/09/2011   Procedure: FLEXIBLE SIGMOIDOSCOPY;  Surgeon: Owens Loffler, MD;  [facility];  Service: Endoscopy;  Laterality: N/A;  . TRANSANAL ENDOSCOPIC MICROSURGERY  06/12/2011   Procedure: TRANSANAL ENDOSCOPIC MICROSURGERY;  Surgeon: Adin Hector, MD;  Location: [facility];  Service: General;  Laterality: N/A;  partial proctectomy by TEM    Family History  Problem Relation Age of Onset  . Hypertension Mother   . Cancer Maternal Grandfather        unsure of type   . Liver cancer Paternal Grandmother   . Stroke Paternal Grandmother   . Heart disease Paternal Grandfather        CAD/MI-fatal      Social History   Socioeconomic History  . Marital status: Divorced    Spouse name: Not on file  . Number of children: 2  . Years of education: 53  . Highest education level: Not on file  Occupational History  . Occupation: Therapist, art   Tobacco Use  . Smoking status: Never Smoker  . Smokeless tobacco: Never Used  Substance and Sexual Activity  . Alcohol use: Yes    Comment: rarely - once a month   .  Drug use: No  . Sexual activity: Yes    Partners: Male    Birth control/protection: Condom    Comment: declined condoms  Other Topics Concern  . Not on file  Social History Narrative   HSG, 2 yrs college. Married '04 - seperated (2012). !son ' 2005; 1  dtr - '08. 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  Lives with children. Wife/ mother is abroad (aug '12).   Social Determinants of Health   Financial Resource Strain:   . Difficulty of Paying Living Expenses:   Food Insecurity:   . Worried About Charity fundraiser in the Last Year:   . Arboriculturist in the Last Year:   Transportation  Needs:   . Film/video editor (Medical):   Marland Kitchen Lack of Transportation (Non-Medical):   Physical Activity:   . Days of Exercise per Week:   . Minutes of Exercise per Session:   Stress:   . Feeling of Stress :   Social Connections:   . Frequency of Communication with Friends and Family:   . Frequency of Social Gatherings with Friends and Family:   . Attends Religious Services:   . Active Member of Clubs or Organizations:   . Attends Archivist Meetings:   Marland Kitchen Marital Status:     No Known Allergies   Current Outpatient Medications:  .  ODEFSEY 200-25-25 MG TABS tablet, TAKE 1 TABLET BY MOUTH DAILY WITH BREAKFAST, Disp: 30 tablet, Rfl: 0   Review of Systems  Constitutional: Negative for activity change, appetite change, chills, diaphoresis, fatigue, fever and unexpected weight change.  HENT: Positive for mouth sores. Negative for congestion, rhinorrhea, sinus pressure, sneezing, sore throat and trouble swallowing.   Eyes: Negative for photophobia and visual disturbance.  Respiratory: Negative for cough, chest tightness, shortness of breath, wheezing and stridor.   Cardiovascular: Negative for chest pain, palpitations and leg swelling.  Gastrointestinal: Positive for rectal pain. Negative for abdominal distention, abdominal pain, anal bleeding, blood in stool, constipation, diarrhea, nausea and vomiting.  Endocrine: Negative for cold intolerance.  Genitourinary: Positive for testicular pain. Negative for difficulty urinating, discharge, dysuria, flank pain, genital sores, hematuria, penile pain, penile swelling and scrotal swelling.  Musculoskeletal: Negative for arthralgias, back pain, gait problem, joint swelling and myalgias.  Skin: Positive for rash. Negative for color change, pallor and wound.  Neurological: Negative for dizziness, tremors, weakness and light-headedness.  Hematological: Negative for adenopathy. Does not bruise/bleed easily.  Psychiatric/Behavioral:  Negative for agitation, behavioral problems, confusion, decreased concentration and suicidal ideas.       Objective:   Physical Exam Vitals and nursing note reviewed.  Constitutional:      Appearance: He is well-developed.  HENT:     Head: Normocephalic and atraumatic.     Mouth/Throat:   Eyes:     Conjunctiva/sclera: Conjunctivae normal.  Cardiovascular:     Rate and Rhythm: Normal rate and regular rhythm.  Pulmonary:     Effort: Pulmonary effort is normal. No respiratory distress.     Breath sounds: No wheezing.  Abdominal:     General: There is no distension.     Palpations: Abdomen is soft.  Musculoskeletal:        General: No tenderness. Normal range of motion.     Cervical back: Normal range of motion and neck supple.  Skin:    General: Skin is warm and dry.  Coloration: Skin is not pale.     Findings: No erythema or rash.  Neurological:     Mental Status: He is alert and oriented to person, place, and time.  Psychiatric:        Attention and Perception: Attention normal.        Mood and Affect: Mood is anxious.        Behavior: Behavior normal.        Thought Content: Thought content normal.        Judgment: Judgment normal.   Hypopigmented skin lesions January 11, 2020:          Assessment & Plan:  HIV: Continue ODEFSEY with chewed food.    Hypopigmented lesions: Could be pityriasis could be vitiligo.  Will refer to dermatology  Epididymal cyst: Can follow-up with urology if needed  Discharge some rectum along with oropharynx lesion: Wonder about an STI we will check him for gonorrhea chlamydia oropharynx rectum and urine  We will also check for trichomonas  Oropharyngeal lesion: Not clear what this is and if this is all part of some consolation of symptoms related to his rash as well.

## 2020-01-12 LAB — URINE CYTOLOGY ANCILLARY ONLY
Chlamydia: NEGATIVE
Comment: NEGATIVE
Comment: NORMAL
Neisseria Gonorrhea: NEGATIVE

## 2020-01-12 LAB — CYTOLOGY, (ORAL, ANAL, URETHRAL) ANCILLARY ONLY
Chlamydia: NEGATIVE
Chlamydia: NEGATIVE
Comment: NEGATIVE
Comment: NEGATIVE
Comment: NORMAL
Comment: NORMAL
Neisseria Gonorrhea: NEGATIVE
Neisseria Gonorrhea: NEGATIVE

## 2020-02-16 ENCOUNTER — Other Ambulatory Visit: Payer: Self-pay | Admitting: Infectious Disease

## 2020-02-16 DIAGNOSIS — B20 Human immunodeficiency virus [HIV] disease: Secondary | ICD-10-CM

## 2020-07-08 ENCOUNTER — Other Ambulatory Visit: Payer: Self-pay | Admitting: Infectious Disease

## 2020-07-08 DIAGNOSIS — B20 Human immunodeficiency virus [HIV] disease: Secondary | ICD-10-CM

## 2020-07-11 ENCOUNTER — Other Ambulatory Visit (HOSPITAL_COMMUNITY)
Admission: RE | Admit: 2020-07-11 | Discharge: 2020-07-11 | Disposition: A | Discharge status: Home / Self Care | Payer: BC Managed Care – PPO | Source: Ambulatory Visit | Attending: Infectious Disease | Admitting: Infectious Disease

## 2020-07-11 ENCOUNTER — Other Ambulatory Visit: Payer: BC Managed Care – PPO

## 2020-07-11 ENCOUNTER — Other Ambulatory Visit: Payer: Self-pay

## 2020-07-11 DIAGNOSIS — L80 Vitiligo: Secondary | ICD-10-CM

## 2020-07-11 DIAGNOSIS — R198 Other specified symptoms and signs involving the digestive system and abdomen: Secondary | ICD-10-CM

## 2020-07-11 DIAGNOSIS — B20 Human immunodeficiency virus [HIV] disease: Secondary | ICD-10-CM | POA: Insufficient documentation

## 2020-07-11 DIAGNOSIS — Z113 Encounter for screening for infections with a predominantly sexual mode of transmission: Secondary | ICD-10-CM | POA: Insufficient documentation

## 2020-07-12 LAB — URINE CYTOLOGY ANCILLARY ONLY
Chlamydia: NEGATIVE
Comment: NEGATIVE
Comment: NORMAL
Neisseria Gonorrhea: NEGATIVE

## 2020-07-12 LAB — T-HELPER CELL (CD4) - (RCID CLINIC ONLY)
CD4 % Helper T Cell: 27 % — ABNORMAL LOW (ref 33–65)
CD4 T Cell Abs: 840 /uL (ref 400–1790)

## 2020-07-13 LAB — COMPLETE METABOLIC PANEL WITH GFR
AG Ratio: 1.8 (calc) (ref 1.0–2.5)
ALT: 18 U/L (ref 9–46)
AST: 16 U/L (ref 10–40)
Albumin: 4.4 g/dL (ref 3.6–5.1)
Alkaline phosphatase (APISO): 69 U/L (ref 36–130)
BUN: 21 mg/dL (ref 7–25)
CO2: 31 mmol/L (ref 20–32)
Calcium: 9.6 mg/dL (ref 8.6–10.3)
Chloride: 105 mmol/L (ref 98–110)
Creat: 1.29 mg/dL (ref 0.60–1.35)
GFR, Est African American: 78 mL/min/{1.73_m2} (ref >=60)
GFR, Est Non African American: 67 mL/min/{1.73_m2} (ref >=60)
Globulin: 2.5 g/dL (calc) (ref 1.9–3.7)
Glucose, Bld: 83 mg/dL (ref 65–99)
Potassium: 4.1 mmol/L (ref 3.5–5.3)
Sodium: 141 mmol/L (ref 135–146)
Total Bilirubin: 0.4 mg/dL (ref 0.2–1.2)
Total Protein: 6.9 g/dL (ref 6.1–8.1)

## 2020-07-13 LAB — CBC WITH DIFFERENTIAL/PLATELET
Absolute Monocytes: 301 cells/uL (ref 200–950)
Basophils Absolute: 18 cells/uL (ref 0–200)
Basophils Relative: 0.3 %
Eosinophils Absolute: 71 cells/uL (ref 15–500)
Eosinophils Relative: 1.2 %
HCT: 46.5 % (ref 38.5–50.0)
Hemoglobin: 16.1 g/dL (ref 13.2–17.1)
Lymphs Abs: 3162 cells/uL (ref 850–3900)
MCH: 29.6 pg (ref 27.0–33.0)
MCHC: 34.6 g/dL (ref 32.0–36.0)
MCV: 85.5 fL (ref 80.0–100.0)
MPV: 9.2 fL (ref 7.5–12.5)
Monocytes Relative: 5.1 %
Neutro Abs: 2348 cells/uL (ref 1500–7800)
Neutrophils Relative %: 39.8 %
Platelets: 238 10*3/uL (ref 140–400)
RBC: 5.44 10*6/uL (ref 4.20–5.80)
RDW: 14.5 % (ref 11.0–15.0)
Total Lymphocyte: 53.6 %
WBC: 5.9 10*3/uL (ref 3.8–10.8)

## 2020-07-13 LAB — HIV-1 RNA QUANT-NO REFLEX-BLD
HIV 1 RNA Quant: <20 Copies/mL
HIV-1 RNA Quant, Log: <1.3 Log cps/mL

## 2020-07-13 LAB — RPR: RPR Ser Ql: NONREACTIVE

## 2020-07-29 NOTE — Progress Notes (Signed)
Patient ID: Kenneth Mason, male   DOB: Jan 01, 1976, 45 y.o.   MRN: [id number] Patient referred to [facility]  Was scheduled for 07-05-20 @ 10:20  With Dr Elvera Lennox   Message from [facility] they have closed referral as patient no showed his appointment

## 2020-08-16 ENCOUNTER — Ambulatory Visit (INDEPENDENT_AMBULATORY_CARE_PROVIDER_SITE_OTHER): Payer: BC Managed Care – PPO | Admitting: Infectious Disease

## 2020-08-16 ENCOUNTER — Encounter: Payer: Self-pay | Admitting: Infectious Disease

## 2020-08-16 ENCOUNTER — Other Ambulatory Visit: Payer: Self-pay

## 2020-08-16 VITALS — BP 125/77 | HR 66 | Temp 98.2°F | Ht 71.0 in | Wt 163.0 lb

## 2020-08-16 DIAGNOSIS — R109 Unspecified abdominal pain: Secondary | ICD-10-CM | POA: Diagnosis not present

## 2020-08-16 DIAGNOSIS — R0781 Pleurodynia: Secondary | ICD-10-CM

## 2020-08-16 DIAGNOSIS — R0789 Other chest pain: Secondary | ICD-10-CM

## 2020-08-16 DIAGNOSIS — R10A0 Flank pain, unspecified side: Secondary | ICD-10-CM

## 2020-08-16 DIAGNOSIS — B20 Human immunodeficiency virus [HIV] disease: Secondary | ICD-10-CM

## 2020-08-16 DIAGNOSIS — R1011 Right upper quadrant pain: Secondary | ICD-10-CM | POA: Diagnosis not present

## 2020-08-16 HISTORY — DX: Right upper quadrant pain: R10.11

## 2020-08-16 HISTORY — DX: Unspecified abdominal pain: R10.9

## 2020-08-16 HISTORY — DX: Pleurodynia: R07.81

## 2020-08-16 NOTE — Progress Notes (Signed)
Chief complaint: Concerned about his kidneys and complaining of right upper quadrant and flank pain Subjective:    Patient ID: Kenneth Mason, male    DOB: 06-Feb-1976, 45 y.o.   MRN: [id number]  HPI  45 year old man with HIV, started on complera started in January 2014 with perfect virological suppresion and healthy CD4, now on ODEFSEY. .   Continue to maintain perfect virological suppression on [facility] which he takes with food.  He says that he has been having pain in his right flank and under his right rib in his right upper quadrant for the last 10 days.  This comes and goes and does not have a clear association with food.  Does not appear to be relieved by anything other than time.  He rates it as being 4-5 out of 10 in severity at most is more of a throbbing pain.  He says he was considering going to the emergency department but decided to wait until he saw me.          Past Medical History:  Diagnosis Date  . Anal wart 11/21/2014  . Anxiety   . Benign rectal wall submucosal cyst s/p TEM partial proctectomy [id number]    . Depression   . Flank pain 08/16/2020  . Headache in front of head    q 2 weeks. No n/v, no photophobia. Responds to NSAIDs  . HIV disease ([place]) 05/13/2015  . HIV infection ([place])    dx'd HIV July 12; CD4 1100.  Presented with lymphadenopathy  . Insomnia 12/05/2015  . Need for prophylactic vaccination against Streptococcus pneumoniae (pneumococcus) 12/07/2017  . Rectal polyp 1999   polyp excised  . Rib pain 08/16/2020  . RUQ pain 08/16/2020  . Screen for STD (sexually transmitted disease) 05/13/2015    Past Surgical History:  Procedure Laterality Date  . DENTAL SURGERY  2010   Wisdom Teeth   . EUS  04/09/2011   Procedure: LOWER ENDOSCOPIC ULTRASOUND (EUS);  Surgeon: Owens Loffler, MD;  Location: [facility];  Service: Endoscopy;  Laterality: N/A;  . FLEXIBLE SIGMOIDOSCOPY  04/09/2011   Procedure: FLEXIBLE SIGMOIDOSCOPY;  Surgeon: Owens Loffler, MD;  Location:  [facility];  Service: Endoscopy;  Laterality: N/A;  . TRANSANAL ENDOSCOPIC MICROSURGERY  06/12/2011   Procedure: TRANSANAL ENDOSCOPIC MICROSURGERY;  Surgeon: Adin Hector, MD;  Location: [facility];  Service: General;  Laterality: N/A;  partial proctectomy by TEM    Family History  Problem Relation Age of Onset  . Hypertension Mother   . Cancer Maternal Grandfather        unsure of type   . Liver cancer Paternal Grandmother   . Stroke Paternal Grandmother   . Heart disease Paternal Grandfather        CAD/MI-fatal      Social History   Socioeconomic History  . Marital status: Divorced    Spouse name: Not on file  . Number of children: 2  . Years of education: 71  . Highest education level: Not on file  Occupational History  . Occupation: Therapist, art   Tobacco Use  . Smoking status: Never Smoker  . Smokeless tobacco: Never Used  Substance and Sexual Activity  . Alcohol use: Yes    Comment: rarely - once a month   . Drug use: No  . Sexual activity: Yes    Partners: Male    Birth control/protection: Condom    Comment: givencondoms  Other Topics Concern  . Not on file  Social History Narrative  HSG, 2 yrs college. Married '04 - seperated (2012). !son ' 2005; 1  dtr - '08. 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  Lives with children. Wife/ mother is abroad (aug '12).   Social Determinants of Health   Financial Resource Strain: Not on file  Food Insecurity: Not on file  Transportation Needs: Not on file  Physical Activity: Not on file  Stress: Not on file  Social Connections: Not on file    No Known Allergies   Current Outpatient Medications:  .  ODEFSEY 200-25-25 MG TABS tablet, TAKE 1 TABLET BY MOUTH DAILY WITH BREAKFAST, Disp: 30 tablet, Rfl: 4   Review of Systems  Constitutional: Negative for activity change, appetite change, chills, diaphoresis, fatigue, fever and  unexpected weight change.  HENT: Negative for congestion, rhinorrhea, sinus pressure, sneezing, sore throat and trouble swallowing.   Eyes: Negative for photophobia and visual disturbance.  Respiratory: Negative for cough, chest tightness, shortness of breath, wheezing and stridor.   Cardiovascular: Negative for chest pain, palpitations and leg swelling.  Gastrointestinal: Positive for abdominal pain. Negative for abdominal distention, anal bleeding, blood in stool, constipation, diarrhea, nausea and vomiting.  Endocrine: Negative for cold intolerance.  Genitourinary: Negative for difficulty urinating, dysuria, flank pain, genital sores, hematuria, penile discharge, penile pain, penile swelling and scrotal swelling.  Musculoskeletal: Negative for arthralgias, back pain, gait problem, joint swelling and myalgias.  Skin: Negative for color change, pallor and wound.  Neurological: Negative for dizziness, tremors, weakness and light-headedness.  Hematological: Negative for adenopathy. Does not bruise/bleed easily.  Psychiatric/Behavioral: Negative for agitation, behavioral problems, confusion, decreased concentration and suicidal ideas.       Objective:   Physical Exam Vitals and nursing note reviewed.  Constitutional:      Appearance: He is well-developed.  HENT:     Head: Normocephalic and atraumatic.     Mouth/Throat:   Eyes:     Conjunctiva/sclera: Conjunctivae normal.  Cardiovascular:     Rate and Rhythm: Normal rate and regular rhythm.  Pulmonary:     Effort: Pulmonary effort is normal. No respiratory distress.     Breath sounds: No wheezing.  Abdominal:     General: There is no distension.     Palpations: Abdomen is soft.  Musculoskeletal:        General: No tenderness. Normal range of motion.     Cervical back: Normal range of motion and neck supple.  Skin:    General: Skin is warm and dry.     Coloration: Skin is not pale.     Findings: No erythema or rash.   Neurological:     Mental Status: He is alert and oriented to person, place, and time.  Psychiatric:        Attention and Perception: Attention normal.        Mood and Affect: Mood is anxious.        Behavior: Behavior normal.        Thought Content: Thought content normal.        Judgment: Judgment normal.         Assessment & Plan:  HIV: continue Odefsey with food  Right upper quadrant pain and flank pain: This is a new problem will obtain a right upper quadrant ultrasound.  His labs that he had done recently are not really consistent with cholecystitis.  Certainly could also be a musculoskeletal strain.  I spent greater than 40 minutes with  the patient including greater than 50% of time in face to face counsel of the patient on his HIV as her upper quadrant pain reviewing all of his laboratory data and in coordination of his care

## 2020-09-04 ENCOUNTER — Ambulatory Visit
Admission: RE | Admit: 2020-09-04 | Discharge: 2020-09-04 | Disposition: A | Discharge status: Home / Self Care | Payer: BC Managed Care – PPO | Source: Ambulatory Visit | Attending: Infectious Disease | Admitting: Infectious Disease

## 2020-09-04 DIAGNOSIS — R1011 Right upper quadrant pain: Secondary | ICD-10-CM

## 2021-01-28 ENCOUNTER — Other Ambulatory Visit: Payer: Self-pay | Admitting: Infectious Disease

## 2021-01-28 DIAGNOSIS — B20 Human immunodeficiency virus [HIV] disease: Secondary | ICD-10-CM

## 2021-03-10 ENCOUNTER — Ambulatory Visit: Payer: BC Managed Care – PPO | Admitting: Infectious Disease

## 2021-03-13 ENCOUNTER — Other Ambulatory Visit: Payer: Self-pay

## 2021-03-13 ENCOUNTER — Other Ambulatory Visit: Payer: BC Managed Care – PPO

## 2021-03-13 DIAGNOSIS — B20 Human immunodeficiency virus [HIV] disease: Secondary | ICD-10-CM

## 2021-03-13 DIAGNOSIS — Z79899 Other long term (current) drug therapy: Secondary | ICD-10-CM

## 2021-03-14 LAB — T-HELPER CELL (CD4) - (RCID CLINIC ONLY)
CD4 % Helper T Cell: 31 % — ABNORMAL LOW (ref 33–65)
CD4 T Cell Abs: 863 /uL (ref 400–1790)

## 2021-03-15 LAB — CBC WITH DIFFERENTIAL/PLATELET
Absolute Monocytes: 291 cells/uL (ref 200–950)
Basophils Absolute: 11 cells/uL (ref 0–200)
Basophils Relative: 0.2 %
Eosinophils Absolute: 40 cells/uL (ref 15–500)
Eosinophils Relative: 0.7 %
HCT: 45.2 % (ref 38.5–50.0)
Hemoglobin: 15.5 g/dL (ref 13.2–17.1)
Lymphs Abs: 2873 cells/uL (ref 850–3900)
MCH: 29.8 pg (ref 27.0–33.0)
MCHC: 34.3 g/dL (ref 32.0–36.0)
MCV: 86.8 fL (ref 80.0–100.0)
MPV: 9.2 fL (ref 7.5–12.5)
Monocytes Relative: 5.1 %
Neutro Abs: 2485 cells/uL (ref 1500–7800)
Neutrophils Relative %: 43.6 %
Platelets: 214 10*3/uL (ref 140–400)
RBC: 5.21 10*6/uL (ref 4.20–5.80)
RDW: 14.1 % (ref 11.0–15.0)
Total Lymphocyte: 50.4 %
WBC: 5.7 10*3/uL (ref 3.8–10.8)

## 2021-03-15 LAB — COMPLETE METABOLIC PANEL WITH GFR
AG Ratio: 1.9 (calc) (ref 1.0–2.5)
ALT: 14 U/L (ref 9–46)
AST: 15 U/L (ref 10–40)
Albumin: 4.1 g/dL (ref 3.6–5.1)
Alkaline phosphatase (APISO): 68 U/L (ref 36–130)
BUN: 11 mg/dL (ref 7–25)
CO2: 33 mmol/L — ABNORMAL HIGH (ref 20–32)
Calcium: 9.3 mg/dL (ref 8.6–10.3)
Chloride: 104 mmol/L (ref 98–110)
Creat: 1.23 mg/dL (ref 0.60–1.29)
Globulin: 2.2 g/dL (calc) (ref 1.9–3.7)
Glucose, Bld: 106 mg/dL — ABNORMAL HIGH (ref 65–99)
Potassium: 4 mmol/L (ref 3.5–5.3)
Sodium: 141 mmol/L (ref 135–146)
Total Bilirubin: 0.6 mg/dL (ref 0.2–1.2)
Total Protein: 6.3 g/dL (ref 6.1–8.1)
eGFR: 75 mL/min/{1.73_m2} (ref >=60)

## 2021-03-15 LAB — HIV-1 RNA QUANT-NO REFLEX-BLD
HIV 1 RNA Quant: NOT DETECTED Copies/mL
HIV-1 RNA Quant, Log: NOT DETECTED Log cps/mL

## 2021-03-15 LAB — LIPID PANEL
Cholesterol: 156 mg/dL (ref <200)
HDL: 51 mg/dL (ref >=40)
LDL Cholesterol (Calc): 90 mg/dL (calc)
Non-HDL Cholesterol (Calc): 105 mg/dL (calc) (ref <130)
Total CHOL/HDL Ratio: 3.1 (calc) (ref <5.0)
Triglycerides: 65 mg/dL (ref <150)

## 2021-03-26 ENCOUNTER — Telehealth: Payer: Self-pay

## 2021-03-26 NOTE — Telephone Encounter (Signed)
Left patient a voice mail to call back to reschedule, patient is looking for a afternoon appointment.

## 2021-04-11 ENCOUNTER — Ambulatory Visit: Payer: BC Managed Care – PPO | Admitting: Infectious Disease

## 2021-04-14 ENCOUNTER — Other Ambulatory Visit: Payer: Self-pay

## 2021-04-14 ENCOUNTER — Ambulatory Visit (INDEPENDENT_AMBULATORY_CARE_PROVIDER_SITE_OTHER): Payer: BC Managed Care – PPO | Admitting: Infectious Disease

## 2021-04-14 ENCOUNTER — Encounter: Payer: Self-pay | Admitting: Infectious Disease

## 2021-04-14 ENCOUNTER — Ambulatory Visit (INDEPENDENT_AMBULATORY_CARE_PROVIDER_SITE_OTHER): Payer: BC Managed Care – PPO

## 2021-04-14 VITALS — BP 138/82 | HR 63 | Temp 98.0°F | Resp 16 | Ht 70.0 in | Wt 168.0 lb

## 2021-04-14 DIAGNOSIS — Z23 Encounter for immunization: Secondary | ICD-10-CM

## 2021-04-14 DIAGNOSIS — E041 Nontoxic single thyroid nodule: Secondary | ICD-10-CM

## 2021-04-14 DIAGNOSIS — B20 Human immunodeficiency virus [HIV] disease: Secondary | ICD-10-CM | POA: Diagnosis not present

## 2021-04-14 HISTORY — DX: Nontoxic single thyroid nodule: E04.1

## 2021-04-14 MED ORDER — ODEFSEY 200-25-25 MG PO TABS
1.0000 | ORAL_TABLET | Freq: Every day | ORAL | 11 refills | Status: DC
Start: 2021-04-14 — End: 2022-03-20

## 2021-04-14 NOTE — Progress Notes (Signed)
   Covid-19 Vaccination Clinic  Name:  Robyn Nohr    MRN: [id number] DOB: Jul 19, 1975  04/14/2021  Mr. Yasui was observed post Covid-19 immunization for 15 minutes without incident. He was provided with Vaccine Information Sheet and instruction to access the V-Safe system.   Mr. Cutbirth was instructed to call 911 with any severe reactions post vaccine: Difficulty breathing  Swelling of face and throat  A fast heartbeat  A bad rash all over body  Dizziness and weakness   Immunizations Administered     Name Date Dose VIS Date Route   Pfizer Covid-19 Vaccine Bivalent Booster 04/14/2021  4:44 PM 0.3 mL 01/29/2021 Intramuscular   Manufacturer: [place]   Lot: [id number]   [place]: [place] Alyssandra Hulsebus, CMA

## 2021-04-14 NOTE — Progress Notes (Signed)
Chief complaint: Follow-up for HIV disease on medications, he is also noticed that area on his neck that moves around and seems to be a thyroid nodule Subjective:    Patient ID: Kenneth Mason, male    DOB: Dec 28, 1975, 45 y.o.   MRN: [id number]  HPI  45 year old man with HIV, started on complera started in January 2014 with perfect virological suppresion and healthy CD4, now on ODEFSEY. .   Continue to maintain perfect virological suppression on [facility] which he takes with food.  He remains perfectly suppressed.  He asked if that was a new better antiretroviral than his Odefsey.  I went through the nuances and different single tablet regimens.  When it comes down to it a patient such as [name] who is highly adherent to medications and does not take other medications that interact with it and to take it correctly there is really no difference between all of the highly effective single tablet regimens.            Past Medical History:  Diagnosis Date   Anal wart 11/21/2014   Anxiety    Benign rectal wall submucosal cyst s/p TEM partial proctectomy Jan2013     Depression    Flank pain 08/16/2020   Headache in front of head    q 2 weeks. No n/v, no photophobia. Responds to NSAIDs   HIV disease ([place]) 05/13/2015   HIV infection ([place])    dx'd HIV July 12; CD4 1100.  Presented with lymphadenopathy   Insomnia 12/05/2015   Need for prophylactic vaccination against Streptococcus pneumoniae (pneumococcus) 12/07/2017   Rectal polyp 1999   polyp excised   Rib pain 08/16/2020   RUQ pain 08/16/2020   Screen for STD (sexually transmitted disease) 05/13/2015    Past Surgical History:  Procedure Laterality Date   DENTAL SURGERY  2010   Wisdom Teeth    EUS  04/09/2011   Procedure: LOWER ENDOSCOPIC ULTRASOUND (EUS);  Surgeon: Owens Loffler, MD;  Location: [facility];  Service: Endoscopy;  Laterality: N/A;   FLEXIBLE SIGMOIDOSCOPY  04/09/2011   Procedure: FLEXIBLE SIGMOIDOSCOPY;  Surgeon: Owens Loffler, MD;  Location: [facility];  Service: Endoscopy;  Laterality: N/A;   TRANSANAL ENDOSCOPIC MICROSURGERY  06/12/2011   Procedure: TRANSANAL ENDOSCOPIC MICROSURGERY;  Surgeon: Adin Hector, MD;  Location: [facility];  Service: General;  Laterality: N/A;  partial proctectomy by TEM    Family History  Problem Relation Age of Onset   Hypertension Mother    Cancer Maternal Grandfather        unsure of type    Liver cancer Paternal Grandmother    Stroke Paternal Grandmother    Heart disease Paternal Grandfather        CAD/MI-fatal      Social History   Socioeconomic History   Marital status: Divorced    Spouse name: Not on file   Number of children: 2   Years of education: 14   Highest education level: Not on file  Occupational History   Occupation: Therapist, art   Tobacco Use   Smoking status: Never   Smokeless tobacco: Never  Substance and Sexual Activity   Alcohol use: Not Currently    Comment: rarely - once a month    Drug use: No   Sexual activity: Yes    Partners: Male    Birth control/protection: Condom    Comment: given condoms  Other Topics Concern   Not on file  Social History Narrative   HSG, 2  yrs college. Married '04 - seperated (2012). !son ' 2005; 1  dtr - '08. 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  Lives with children. Wife/ mother is abroad (aug '12).   Social Determinants of Health   Financial Resource Strain: Not on file  Food Insecurity: Not on file  Transportation Needs: Not on file  Physical Activity: Not on file  Stress: Not on file  Social Connections: Not on file    No Known Allergies   Current Outpatient Medications:    ODEFSEY 200-25-25 MG TABS tablet, TAKE 1 TABLET BY MOUTH DAILY WITH BREAKFAST, Disp: 30 tablet, Rfl: 4   Review of Systems  Constitutional:  Negative for activity change, appetite change, chills, diaphoresis, fatigue, fever and  unexpected weight change.  HENT:  Negative for congestion, rhinorrhea, sinus pressure, sneezing, sore throat and trouble swallowing.   Eyes:  Negative for photophobia and visual disturbance.  Respiratory:  Negative for cough, chest tightness, shortness of breath, wheezing and stridor.   Cardiovascular:  Negative for chest pain, palpitations and leg swelling.  Gastrointestinal:  Negative for abdominal distention, abdominal pain, anal bleeding, blood in stool, constipation, diarrhea, nausea and vomiting.  Genitourinary:  Negative for difficulty urinating, dysuria, flank pain and hematuria.  Musculoskeletal:  Negative for arthralgias, back pain, gait problem, joint swelling and myalgias.  Skin:  Negative for color change, pallor, rash and wound.  Neurological:  Negative for dizziness, tremors, weakness and light-headedness.  Hematological:  Negative for adenopathy. Does not bruise/bleed easily.  Psychiatric/Behavioral:  Negative for agitation, behavioral problems, confusion, decreased concentration, dysphoric mood and sleep disturbance.       Objective:   Physical Exam Constitutional:      Appearance: He is well-developed.  HENT:     Head: Normocephalic and atraumatic.  Eyes:     Conjunctiva/sclera: Conjunctivae normal.  Neck:     Thyroid: Thyroid mass present. No thyromegaly or thyroid tenderness.  Cardiovascular:     Rate and Rhythm: Normal rate and regular rhythm.  Pulmonary:     Effort: Pulmonary effort is normal. No respiratory distress.     Breath sounds: No wheezing.  Abdominal:     General: There is no distension.     Palpations: Abdomen is soft.  Musculoskeletal:        General: No tenderness. Normal range of motion.     Cervical back: Normal range of motion and neck supple.  Skin:    General: Skin is warm and dry.     Coloration: Skin is not pale.     Findings: No erythema or rash.  Neurological:     General: No focal deficit present.     Mental Status: He is alert and  oriented to person, place, and time.  Psychiatric:        Mood and Affect: Mood normal.        Behavior: Behavior normal.        Thought Content: Thought content normal.        Judgment: Judgment normal.        Assessment & Plan:  HIV disease:  I reviewed his viral load was not detected March 13, 2020 2  Lab Results  Component Value Date   HIV1RNAQUANT Not Detected 03/13/2021    Reviewed his CD4 count which was 863 from same date.  Lab Results  Component Value Date   CD4TABS 863 03/13/2021   CD4TABS 840 07/11/2020   CD4TABS 952  12/14/2019    I am continue his prescription for [facility] with food and avoidance of an acids   Thyroid nodule check TFTs today and order ultrasound of thyroid.  Vaccine counseling made sure that he was up-to-date on vaccines today.

## 2021-04-15 LAB — TSH+FREE T4: TSH W/REFLEX TO FT4: 1.12 mIU/L (ref 0.40–4.50)

## 2021-04-29 ENCOUNTER — Other Ambulatory Visit: Payer: BC Managed Care – PPO

## 2021-04-30 ENCOUNTER — Ambulatory Visit
Admission: RE | Admit: 2021-04-30 | Discharge: 2021-04-30 | Disposition: A | Discharge status: Home / Self Care | Payer: BC Managed Care – PPO | Source: Ambulatory Visit | Attending: Infectious Disease | Admitting: Infectious Disease

## 2021-04-30 DIAGNOSIS — E041 Nontoxic single thyroid nodule: Secondary | ICD-10-CM

## 2021-05-05 ENCOUNTER — Telehealth: Payer: Self-pay

## 2021-05-05 ENCOUNTER — Other Ambulatory Visit: Payer: Self-pay | Admitting: Infectious Disease

## 2021-05-05 DIAGNOSIS — E041 Nontoxic single thyroid nodule: Secondary | ICD-10-CM

## 2021-05-05 NOTE — Telephone Encounter (Signed)
-----   Message from Truman Hayward, MD sent at 05/05/2021 10:38 AM EST ----- Per radiology suggestion I made a referral to ear nose and throat ----- Message ----- From: Interface, Rad Results In Sent: 04/30/2021  12:04 PM EST To: Truman Hayward, MD

## 2021-07-17 DIAGNOSIS — R490 Dysphonia: Secondary | ICD-10-CM | POA: Insufficient documentation

## 2021-10-10 ENCOUNTER — Other Ambulatory Visit: Payer: Self-pay | Admitting: Otolaryngology

## 2021-10-10 HISTORY — PX: BIOPSY THYROID: PRO38

## 2021-10-17 DIAGNOSIS — C73 Malignant neoplasm of thyroid gland: Secondary | ICD-10-CM | POA: Insufficient documentation

## 2021-12-21 NOTE — H&P (Signed)
HPI:   Kenneth Mason is a 46 y.o. male who presents as a consult Patient.   Referring Provider: Tommy Medal, Anner Crete*  Chief complaint: Neck mass.  HPI: He noticed a small mobile lump in the anterior neck several months ago. He had an ultrasound examination. He was concerned that he might have a problem with his thyroid. 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 He works in a call center during the day so he was concerned he might be overusing his voice. He has minimal reflux risk factors.  PMH/Meds/All/SocHx/FamHx/ROS:   History reviewed. No pertinent past medical history.  Past Surgical History:  Procedure Laterality Date   ANAL FISSURECTOMY   No family history of bleeding disorders, wound healing problems or difficulty with anesthesia.   Social History   Socioeconomic History   Marital status: Single  Spouse name: Not on file   Number of children: Not on file   Years of education: Not on file   Highest education level: Not on file  Occupational History   Not on file  Tobacco Use   Smoking status: Never   Smokeless tobacco: Never  Substance and Sexual Activity   Alcohol use: Not Currently  Comment: once a year   Drug use: Never   Sexual activity: Yes  Other Topics Concern   Not on file  Social History Narrative   Not on file   Social Determinants of Health   Financial Resource Strain: Not on file  Food Insecurity: Not on file  Transportation Needs: Not on file  Physical Activity: Not on file  Stress: Not on file  Social Connections: Not on file  Housing Stability: Not on file   Current Outpatient Medications:   ODEFSEY 200-25-25 mg per tablet, , Disp: , Rfl:   A complete ROS was performed with pertinent positives/negatives noted in the HPI. The remainder of the ROS are negative.   Physical Exam:   Temp 97.7 F (36.5 C)  Ht 1.778 m ('5\' 10"'$ )  Wt 75.1 kg (165 lb 9.6 oz)  BMI 23.76  kg/m   General: Healthy and alert, in no distress, breathing easily. Normal affect. In a pleasant mood. Head: Normocephalic, atraumatic. No masses, or scars. Eyes: Pupils are equal, and reactive to light. Vision is grossly intact. No spontaneous or gaze nystagmus. Ears: Ear canals are clear. Tympanic membranes are intact, with normal landmarks and the middle ears are clear and healthy. Hearing: Grossly normal. Nose: Nasal cavities are clear with healthy mucosa, no polyps or exudate. Airways are patent. Face: No masses or scars, facial nerve function is symmetric. Oral Cavity: No mucosal abnormalities are noted. Tongue with normal mobility. Dentition appears healthy. Oropharynx: Tonsils are symmetric. There are no mucosal masses identified. Tongue base appears normal and healthy. Larynx/Hypopharynx: indirect exam reveals healthy, mobile vocal cords, without mucosal lesions in the hypopharynx or larynx. Chest: Deferred Neck: There is a 1 cm soft mobile delphian node, otherwise no palpable masses, no cervical adenopathy, no thyroid nodules or enlargement. Neuro: Cranial nerves II-XII with normal function. Balance: Normal gate. Other findings: none.  Independent Review of Additional Tests or Records:  IMPRESSION:  1. Multinodular thyroid gland.  2. Nodule labeled 1 in the thyroid isthmus meets criteria for  follow-up ultrasound in 1 year.  3. There are heterogeneous hypoechoic areas in the bilateral  posterior thyroid lobes, which are favored artifactual due to  sonographic technique versus poorly defined thyroid nodules. These  areas measure 1-1.3 cm. These can be followed up at the next  ultrasound in 1 year.  4. In the midline neck just superior to the thyroid gland, there is  a well-circumscribed 1.4 cm subcutaneous nodule. Although its  location favors a thyroglossal duct cyst, it is relatively echogenic  with internal vascularity, making this finding indeterminate.  Further  evaluation can be performed with either CT or MRI of the  neck. Consider ENT referral.   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 Risks and benefits discussed.  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 Recommend consultation with a voice coach or with a voice pathologist. I gave him information about [employer]. Follow-up as needed.

## 2021-12-25 ENCOUNTER — Other Ambulatory Visit (HOSPITAL_COMMUNITY): Payer: BC Managed Care – PPO

## 2021-12-25 NOTE — Pre-Procedure Instructions (Signed)
Surgical Instructions    Your procedure is scheduled on December 31, 2021.  Report to [facility] Main Entrance "A" at 9:00 A.M., then check in with the Admitting office.  Call this number if you have problems the morning of surgery:  [phone]   If you have any questions prior to your surgery date call [phone]: Open Monday-Friday 8am-4pm    Remember:  Do not eat or drink after midnight the night before your surgery    Take these medicines the morning of surgery with A SIP OF WATER:  None   As of today, STOP taking any Aspirin (unless otherwise instructed by your surgeon) Aleve, Naproxen, Ibuprofen, Motrin, Advil, Goody's, BC's, all herbal medications, fish oil, and all vitamins.                     Do NOT Smoke (Tobacco/Vaping) for 24 hours prior to your procedure.  If you use a CPAP at night, you may bring your mask/headgear for your overnight stay.   Contacts, glasses, piercing's, hearing aid's, dentures or partials may not be worn into surgery, please bring cases for these belongings.    For patients admitted to the hospital, discharge time will be determined by your treatment team.   Patients discharged the day of surgery will not be allowed to drive home, and someone needs to stay with them for 24 hours.  SURGICAL WAITING ROOM VISITATION Patients having surgery or a procedure may have no more than 2 support people in the waiting area - these visitors may rotate.   Children under the age of 35 must have an adult with them who is not the patient. If the patient needs to stay at the hospital during part of their recovery, the visitor guidelines for inpatient rooms apply. Pre-op nurse will coordinate an appropriate time for 1 support person to accompany patient in pre-op.  This support person may not rotate.   Please refer to the [facility] website for the visitor guidelines for Inpatients (after your surgery is over and you are in a regular room).    Special  instructions:   [place]- Preparing For Surgery  Before surgery, you can play an important role. Because skin is not sterile, your skin needs to be as free of germs as possible. You can reduce the number of germs on your skin by washing with CHG (chlorahexidine gluconate) Soap before surgery.  CHG is an antiseptic cleaner which kills germs and bonds with the skin to continue killing germs even after washing.    Oral Hygiene is also important to reduce your risk of infection.  Remember - BRUSH YOUR TEETH THE MORNING OF SURGERY WITH YOUR REGULAR TOOTHPASTE  Please do not use if you have an allergy to CHG or antibacterial soaps. If your skin becomes reddened/irritated stop using the CHG.  Do not shave (including legs and underarms) for at least 48 hours prior to first CHG shower. It is OK to shave your face.  Please follow these instructions carefully.   Shower the NIGHT BEFORE SURGERY and the MORNING OF SURGERY  If you chose to wash your hair, wash your hair first as usual with your normal shampoo.  After you shampoo, rinse your hair and body thoroughly to remove the shampoo.  Use CHG Soap as you would any other liquid soap. You can apply CHG directly to the skin and wash gently with a scrungie or a clean washcloth.   Apply the CHG Soap to your body ONLY  FROM THE NECK DOWN.  Do not use on open wounds or open sores. Avoid contact with your eyes, ears, mouth and genitals (private parts). Wash Face and genitals (private parts)  with your normal soap.   Wash thoroughly, paying special attention to the area where your surgery will be performed.  Thoroughly rinse your body with warm water from the neck down.  DO NOT shower/wash with your normal soap after using and rinsing off the CHG Soap.  Pat yourself dry with a CLEAN TOWEL.  Wear CLEAN PAJAMAS to bed the night before surgery  Place CLEAN SHEETS on your bed the night before your surgery  DO NOT SLEEP WITH PETS.   Day of  Surgery: Take a shower with CHG soap. Do not wear jewelry or makeup Do not wear lotions, powders, perfumes/colognes, or deodorant. Do not shave 48 hours prior to surgery.  Men may shave face and neck. Do not bring valuables to the hospital.  [facility] is not responsible for any belongings or valuables. Do not wear nail polish, gel polish, artificial nails, or any other type of covering on natural nails (fingers and toes) If you have artificial nails or gel coating that need to be removed by a nail salon, please have this removed prior to surgery. Artificial nails or gel coating may interfere with anesthesia's ability to adequately monitor your vital signs.  Wear Clean/Comfortable clothing the morning of surgery  Remember to brush your teeth WITH YOUR REGULAR TOOTHPASTE.   Please read over the following fact sheets that you were given.    If you received a COVID test during your pre-op visit  it is requested that you wear a mask when out in public, stay away from anyone that may not be feeling well and notify your surgeon if you develop symptoms. If you have been in contact with anyone that has tested positive in the last 10 days please notify you surgeon.

## 2021-12-26 ENCOUNTER — Other Ambulatory Visit: Payer: Self-pay

## 2021-12-26 ENCOUNTER — Encounter (HOSPITAL_COMMUNITY)
Admission: RE | Admit: 2021-12-26 | Discharge: 2021-12-26 | Disposition: A | Discharge status: Home / Self Care | Payer: BC Managed Care – PPO | Source: Ambulatory Visit | Attending: Otolaryngology | Admitting: Otolaryngology

## 2021-12-26 ENCOUNTER — Encounter (HOSPITAL_COMMUNITY): Payer: Self-pay

## 2021-12-26 VITALS — BP 115/83 | HR 61 | Temp 97.7°F | Resp 17 | Ht 70.0 in | Wt 166.4 lb

## 2021-12-26 DIAGNOSIS — Z789 Other specified health status: Secondary | ICD-10-CM

## 2021-12-26 DIAGNOSIS — Z20822 Contact with and (suspected) exposure to covid-19: Secondary | ICD-10-CM | POA: Insufficient documentation

## 2021-12-26 DIAGNOSIS — Z01818 Encounter for other preprocedural examination: Secondary | ICD-10-CM | POA: Insufficient documentation

## 2021-12-26 LAB — CBC
HCT: 43.1 % (ref 39.0–52.0)
Hemoglobin: 15.3 g/dL (ref 13.0–17.0)
MCH: 29.5 pg (ref 26.0–34.0)
MCHC: 35.5 g/dL (ref 30.0–36.0)
MCV: 83.2 fL (ref 80.0–100.0)
Platelets: 201 10*3/uL (ref 150–400)
RBC: 5.18 MIL/uL (ref 4.22–5.81)
RDW: 13.7 % (ref 11.5–15.5)
WBC: 6.9 10*3/uL (ref 4.0–10.5)
nRBC: 0 % (ref 0.0–0.2)

## 2021-12-26 LAB — SARS CORONAVIRUS 2 (TAT 6-24 HRS): SARS Coronavirus 2: NEGATIVE

## 2021-12-26 NOTE — Progress Notes (Signed)
PCP - Dr. Tommy Medal Cardiologist - Denies  PPM/ICD - Denies Device Orders - n/a Rep Notified - n/a   Chest x-ray - n/a EKG - n/a Stress Test - n/a ECHO - n/a Cardiac Cath - n/a  Sleep Study - Denies CPAP - n/a  No DM  Blood Thinner Instructions: n/a Aspirin Instructions: STOP taking any aspirin today  NPO after midnight  COVID TEST- Yes. Complex ENT Surgery   Anesthesia review: No  Patient denies shortness of breath, fever, cough and chest pain at PAT appointment   All instructions explained to the patient, with a verbal understanding of the material. Patient agrees to go over the instructions while at home for a better understanding. Patient also instructed to self quarantine after being tested for COVID-19. The opportunity to ask questions was provided.

## 2021-12-30 NOTE — Anesthesia Preprocedure Evaluation (Signed)
Anesthesia Evaluation  Patient identified by MRN, date of birth, ID band Patient awake    Reviewed: Allergy & Precautions, NPO status , Patient's Chart, lab work & pertinent test results  Airway Mallampati: I  TM Distance: >3 FB Neck ROM: Full    Dental no notable dental hx. 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   Renal/GU negative Renal ROS  negative genitourinary   Musculoskeletal negative musculoskeletal ROS (+)   Abdominal   Peds  Hematology  (+) HIV, Hb 15.3 2022 viral loads undetectable    Anesthesia Other Findings   Reproductive/Obstetrics negative OB ROS                            Anesthesia Physical Anesthesia Plan  ASA: 2  Anesthesia Plan: General   Post-op Pain Management: Tylenol PO (pre-op)*   Induction: Intravenous  PONV Risk Score and Plan: 3 and Ondansetron, Dexamethasone, Midazolam and Treatment may vary due to age or medical condition  Airway Management Planned: Oral ETT  Additional Equipment: None  Intra-op Plan:   Post-operative Plan: Extubation in OR  Informed Consent: I have reviewed the patients History and Physical, chart, labs and discussed the procedure including the risks, benefits and alternatives for the proposed anesthesia with the patient or authorized representative who has indicated his/her understanding and acceptance.     Dental advisory given  Plan Discussed with: CRNA  Anesthesia Plan Comments:        Anesthesia Quick Evaluation

## 2021-12-31 ENCOUNTER — Ambulatory Visit (HOSPITAL_COMMUNITY): Payer: BC Managed Care – PPO | Admitting: Anesthesiology

## 2021-12-31 ENCOUNTER — Encounter (HOSPITAL_COMMUNITY): Payer: Self-pay | Admitting: Otolaryngology

## 2021-12-31 ENCOUNTER — Other Ambulatory Visit: Payer: Self-pay

## 2021-12-31 ENCOUNTER — Observation Stay (HOSPITAL_COMMUNITY)
Admission: RE | Admit: 2021-12-31 | Discharge: 2022-01-01 | Disposition: A | Discharge status: Home / Self Care | Payer: BC Managed Care – PPO | Attending: Otolaryngology | Admitting: Otolaryngology

## 2021-12-31 ENCOUNTER — Encounter (HOSPITAL_COMMUNITY)
Admission: RE | Disposition: A | Discharge status: Home / Self Care | Payer: Self-pay | Source: Home / Self Care | Attending: Otolaryngology

## 2021-12-31 DIAGNOSIS — Z79899 Other long term (current) drug therapy: Secondary | ICD-10-CM | POA: Diagnosis not present

## 2021-12-31 DIAGNOSIS — R490 Dysphonia: Secondary | ICD-10-CM | POA: Insufficient documentation

## 2021-12-31 DIAGNOSIS — C73 Malignant neoplasm of thyroid gland: Secondary | ICD-10-CM | POA: Diagnosis not present

## 2021-12-31 HISTORY — PX: THYROIDECTOMY: SHX17

## 2021-12-31 HISTORY — DX: Malignant neoplasm of thyroid gland: C73

## 2021-12-31 SURGERY — THYROIDECTOMY
Anesthesia: General | Site: Neck | Laterality: Bilateral

## 2021-12-31 MED ORDER — LACTATED RINGERS IV SOLN: INTRAVENOUS | Status: DC

## 2021-12-31 MED ORDER — SUGAMMADEX SODIUM 200 MG/2ML IV SOLN
INTRAVENOUS | Status: DC | PRN
  Administered 2021-12-31: 200 mg via INTRAVENOUS

## 2021-12-31 MED ORDER — ATROPINE SULFATE 0.4 MG/ML IV SOLN
INTRAVENOUS | Status: AC
  Filled 2021-12-31: qty 1

## 2021-12-31 MED ORDER — HYDROMORPHONE HCL 1 MG/ML IJ SOLN: 0.2500 mg | INTRAMUSCULAR | Status: DC | PRN

## 2021-12-31 MED ORDER — SUCCINYLCHOLINE CHLORIDE 200 MG/10ML IV SOSY
PREFILLED_SYRINGE | INTRAVENOUS | Status: AC
  Filled 2021-12-31: qty 10

## 2021-12-31 MED ORDER — LEVOTHYROXINE SODIUM 100 MCG PO TABS: 100.0000 ug | ORAL_TABLET | Freq: Every day | ORAL | 6 refills | Status: DC

## 2021-12-31 MED ORDER — DEXAMETHASONE SODIUM PHOSPHATE 10 MG/ML IJ SOLN
INTRAMUSCULAR | Status: DC | PRN
  Administered 2021-12-31: 10 mg via INTRAVENOUS

## 2021-12-31 MED ORDER — CHLORHEXIDINE GLUCONATE 0.12 % MT SOLN
OROMUCOSAL | Status: AC
  Administered 2021-12-31: 15 mL via OROMUCOSAL
  Filled 2021-12-31: qty 15

## 2021-12-31 MED ORDER — DEXAMETHASONE SODIUM PHOSPHATE 10 MG/ML IJ SOLN
INTRAMUSCULAR | Status: AC
  Filled 2021-12-31: qty 2

## 2021-12-31 MED ORDER — ONDANSETRON HCL 4 MG PO TABS: 4.0000 mg | ORAL_TABLET | ORAL | Status: DC | PRN

## 2021-12-31 MED ORDER — OXYCODONE HCL 5 MG PO TABS: 5.0000 mg | ORAL_TABLET | Freq: Once | ORAL | Status: DC | PRN

## 2021-12-31 MED ORDER — ROCURONIUM BROMIDE 10 MG/ML (PF) SYRINGE
PREFILLED_SYRINGE | INTRAVENOUS | Status: DC | PRN
  Administered 2021-12-31: 80 mg via INTRAVENOUS

## 2021-12-31 MED ORDER — ACETAMINOPHEN 500 MG PO TABS
ORAL_TABLET | ORAL | Status: AC
  Administered 2021-12-31: 1000 mg via ORAL
  Filled 2021-12-31: qty 2

## 2021-12-31 MED ORDER — IBUPROFEN 100 MG/5ML PO SUSP
400.0000 mg | Freq: Four times a day (QID) | ORAL | Status: DC | PRN
  Administered 2021-12-31 – 2022-01-01 (×3): 400 mg via ORAL
  Filled 2021-12-31 (×3): qty 20

## 2021-12-31 MED ORDER — FENTANYL CITRATE (PF) 250 MCG/5ML IJ SOLN
INTRAMUSCULAR | Status: AC
  Filled 2021-12-31: qty 5

## 2021-12-31 MED ORDER — 0.9 % SODIUM CHLORIDE (POUR BTL) OPTIME
TOPICAL | Status: DC | PRN
  Administered 2021-12-31: 1000 mL

## 2021-12-31 MED ORDER — MIDAZOLAM HCL 2 MG/2ML IJ SOLN
INTRAMUSCULAR | Status: DC | PRN
  Administered 2021-12-31: 2 mg via INTRAVENOUS

## 2021-12-31 MED ORDER — FENTANYL CITRATE (PF) 250 MCG/5ML IJ SOLN
INTRAMUSCULAR | Status: DC | PRN
  Administered 2021-12-31: 100 ug via INTRAVENOUS
  Administered 2021-12-31: 50 ug via INTRAVENOUS

## 2021-12-31 MED ORDER — DEXTROSE-NACL 5-0.9 % IV SOLN: INTRAVENOUS | Status: DC

## 2021-12-31 MED ORDER — ACETAMINOPHEN 500 MG PO TABS
1000.0000 mg | ORAL_TABLET | Freq: Once | ORAL | Status: AC
Start: 2021-12-31 — End: 2021-12-31

## 2021-12-31 MED ORDER — ONDANSETRON HCL 4 MG/2ML IJ SOLN
INTRAMUSCULAR | Status: DC | PRN
  Administered 2021-12-31: 4 mg via INTRAVENOUS

## 2021-12-31 MED ORDER — CHLORHEXIDINE GLUCONATE 0.12 % MT SOLN: 15.0000 mL | Freq: Once | OROMUCOSAL | Status: AC

## 2021-12-31 MED ORDER — ORAL CARE MOUTH RINSE: 15.0000 mL | Freq: Once | OROMUCOSAL | Status: AC

## 2021-12-31 MED ORDER — PROPOFOL 10 MG/ML IV BOLUS
INTRAVENOUS | Status: DC | PRN
  Administered 2021-12-31: 200 mg via INTRAVENOUS

## 2021-12-31 MED ORDER — LIDOCAINE 2% (20 MG/ML) 5 ML SYRINGE
INTRAMUSCULAR | Status: DC | PRN
  Administered 2021-12-31: 60 mg via INTRAVENOUS

## 2021-12-31 MED ORDER — LIDOCAINE-EPINEPHRINE 1 %-1:100000 IJ SOLN
INTRAMUSCULAR | Status: AC
  Filled 2021-12-31: qty 1

## 2021-12-31 MED ORDER — HYDROCODONE-ACETAMINOPHEN 5-325 MG PO TABS: 1.0000 | ORAL_TABLET | ORAL | Status: DC | PRN

## 2021-12-31 MED ORDER — EPINEPHRINE 1 MG/10ML IJ SOSY
PREFILLED_SYRINGE | INTRAMUSCULAR | Status: AC
  Filled 2021-12-31: qty 10

## 2021-12-31 MED ORDER — ONDANSETRON HCL 4 MG/2ML IJ SOLN: 4.0000 mg | Freq: Once | INTRAMUSCULAR | Status: DC | PRN

## 2021-12-31 MED ORDER — LIDOCAINE 2% (20 MG/ML) 5 ML SYRINGE
INTRAMUSCULAR | Status: AC
  Filled 2021-12-31: qty 5

## 2021-12-31 MED ORDER — OXYCODONE HCL 5 MG/5ML PO SOLN: 5.0000 mg | Freq: Once | ORAL | Status: DC | PRN

## 2021-12-31 MED ORDER — ONDANSETRON HCL 4 MG/2ML IJ SOLN
INTRAMUSCULAR | Status: AC
  Filled 2021-12-31: qty 4

## 2021-12-31 MED ORDER — ONDANSETRON HCL 4 MG/2ML IJ SOLN: 4.0000 mg | INTRAMUSCULAR | Status: DC | PRN

## 2021-12-31 MED ORDER — EMTRICITAB-RILPIVIR-TENOFOV AF 200-25-25 MG PO TABS
1.0000 | ORAL_TABLET | Freq: Every day | ORAL | Status: DC
  Administered 2022-01-01: 1 via ORAL
  Filled 2021-12-31: qty 1

## 2021-12-31 MED ORDER — LEVOTHYROXINE SODIUM 100 MCG PO TABS
100.0000 ug | ORAL_TABLET | Freq: Every day | ORAL | Status: DC
  Administered 2022-01-01: 100 ug via ORAL
  Filled 2021-12-31: qty 1

## 2021-12-31 MED ORDER — MIDAZOLAM HCL 2 MG/2ML IJ SOLN
INTRAMUSCULAR | Status: AC
  Filled 2021-12-31: qty 2

## 2021-12-31 MED ORDER — HYDROCODONE-ACETAMINOPHEN 7.5-325 MG PO TABS: 1.0000 | ORAL_TABLET | Freq: Four times a day (QID) | ORAL | 0 refills | Status: DC | PRN

## 2021-12-31 MED ORDER — ONDANSETRON HCL 4 MG/2ML IJ SOLN
INTRAMUSCULAR | Status: AC
  Filled 2021-12-31: qty 2

## 2021-12-31 MED ORDER — AMISULPRIDE (ANTIEMETIC) 5 MG/2ML IV SOLN: 10.0000 mg | Freq: Once | INTRAVENOUS | Status: DC | PRN

## 2021-12-31 MED ORDER — PROPOFOL 10 MG/ML IV BOLUS
INTRAVENOUS | Status: AC
  Filled 2021-12-31: qty 20

## 2021-12-31 MED ORDER — DEXAMETHASONE SODIUM PHOSPHATE 10 MG/ML IJ SOLN
INTRAMUSCULAR | Status: AC
  Filled 2021-12-31: qty 1

## 2021-12-31 MED ORDER — ROCURONIUM BROMIDE 10 MG/ML (PF) SYRINGE
PREFILLED_SYRINGE | INTRAVENOUS | Status: AC
  Filled 2021-12-31: qty 10

## 2021-12-31 MED ORDER — ONDANSETRON 4 MG PO TBDP: 4.0000 mg | ORAL_TABLET | Freq: Three times a day (TID) | ORAL | 0 refills | Status: DC | PRN

## 2021-12-31 MED ORDER — LIDOCAINE-EPINEPHRINE 1 %-1:100000 IJ SOLN
INTRAMUSCULAR | Status: DC | PRN
  Administered 2021-12-31: 2 mL

## 2021-12-31 SURGICAL SUPPLY — 42 items
BAG COUNTER SPONGE SURGICOUNT (BAG) ×2 IMPLANT
BLADE SURG 15 STRL LF DISP TIS (BLADE) IMPLANT
BLADE SURG 15 STRL SS (BLADE)
CANISTER SUCT 3000ML PPV (MISCELLANEOUS) ×2 IMPLANT
CLEANER TIP ELECTROSURG 2X2 (MISCELLANEOUS) ×2 IMPLANT
CNTNR URN SCR LID CUP LEK RST (MISCELLANEOUS) ×1 IMPLANT
CONT SPEC 4OZ STRL OR WHT (MISCELLANEOUS) ×2
CORD BIPOLAR FORCEPS 12FT (ELECTRODE) ×2 IMPLANT
COVER SURGICAL LIGHT HANDLE (MISCELLANEOUS) ×2 IMPLANT
DERMABOND ADVANCED (GAUZE/BANDAGES/DRESSINGS) ×1
DERMABOND ADVANCED .7 DNX12 (GAUZE/BANDAGES/DRESSINGS) ×1 IMPLANT
DRAIN HEMOVAC 7FR (DRAIN) IMPLANT
DRAIN JACKSON RD 7FR 3/32 (WOUND CARE) ×1 IMPLANT
DRAPE HALF SHEET 40X57 (DRAPES) IMPLANT
ELECT COATED BLADE 2.86 ST (ELECTRODE) ×2 IMPLANT
ELECT REM PT RETURN 9FT ADLT (ELECTROSURGICAL) ×2
ELECTRODE REM PT RTRN 9FT ADLT (ELECTROSURGICAL) ×1 IMPLANT
EVACUATOR SILICONE 100CC (DRAIN) ×2 IMPLANT
FORCEPS BIPOLAR SPETZLER 8 1.0 (NEUROSURGERY SUPPLIES) ×2 IMPLANT
GAUZE 4X4 16PLY ~~LOC~~+RFID DBL (SPONGE) IMPLANT
GLOVE BIO SURGEON STRL SZ 6.5 (GLOVE) IMPLANT
GLOVE ECLIPSE 7.5 STRL STRAW (GLOVE) ×2 IMPLANT
GOWN STRL REUS W/ TWL LRG LVL3 (GOWN DISPOSABLE) ×2 IMPLANT
GOWN STRL REUS W/TWL LRG LVL3 (GOWN DISPOSABLE) ×4
KIT BASIN OR (CUSTOM PROCEDURE TRAY) ×2 IMPLANT
KIT TURNOVER KIT B (KITS) ×2 IMPLANT
NDL PRECISIONGLIDE 27X1.5 (NEEDLE) ×1 IMPLANT
NEEDLE PRECISIONGLIDE 27X1.5 (NEEDLE) ×2 IMPLANT
NS IRRIG 1000ML POUR BTL (IV SOLUTION) ×2 IMPLANT
PAD ARMBOARD 7.5X6 YLW CONV (MISCELLANEOUS) ×4 IMPLANT
PENCIL FOOT CONTROL (ELECTRODE) ×2 IMPLANT
SHEARS HARMONIC 9CM CVD (BLADE) ×2 IMPLANT
STAPLER VISISTAT 35W (STAPLE) ×2 IMPLANT
SUT CHROMIC 3 0 PS 2 (SUTURE) ×2 IMPLANT
SUT CHROMIC 4 0 PS 2 18 (SUTURE) IMPLANT
SUT ETHILON 3 0 PS 1 (SUTURE) ×2 IMPLANT
SUT SILK 3 0 (SUTURE) ×2
SUT SILK 3 0 REEL (SUTURE) ×2 IMPLANT
SUT SILK 3-0 FS1 18XBRD (SUTURE) IMPLANT
SUT SILK 4 0 REEL (SUTURE) ×2 IMPLANT
TOWEL GREEN STERILE FF (TOWEL DISPOSABLE) ×2 IMPLANT
TRAY ENT MC OR (CUSTOM PROCEDURE TRAY) ×2 IMPLANT

## 2021-12-31 NOTE — Transfer of Care (Signed)
Immediate Anesthesia Transfer of Care Note  Patient: Kenneth Mason  Procedure(s) Performed: TOTAL THYROIDECTOMY (Bilateral: Neck)  Patient Location: PACU  Anesthesia Type:General  Level of Consciousness: sedated  Airway & Oxygen Therapy: Patient Spontanous Breathing and Patient connected to face mask oxygen  Post-op Assessment: Report given to RN and Post -op Vital signs reviewed and stable  Post vital signs: Reviewed and stable  Last Vitals:  Vitals Value Taken Time  BP    Temp    Pulse    Resp    SpO2      Last Pain:  Vitals:   12/31/21 0912  PainSc: 0-No pain      Patients Stated Pain Goal: 0 (94/47/39 [id number])  Complications: No notable events documented.

## 2021-12-31 NOTE — Anesthesia Procedure Notes (Signed)
Procedure Name: Intubation Date/Time: 12/31/2021 11:08 AM  Performed by: Leonor Liv, CRNAPre-anesthesia Checklist: Patient identified, Emergency Drugs available, Suction available and Patient being monitored Patient Re-evaluated:Patient Re-evaluated prior to induction Oxygen Delivery Method: Circle System Utilized Preoxygenation: Pre-oxygenation with 100% oxygen Induction Type: IV induction Ventilation: Mask ventilation without difficulty Laryngoscope Size: Mac and 4 Grade View: Grade I Tube type: Oral Tube size: 7.5 mm Number of attempts: 1 Airway Equipment and Method: Stylet and Oral airway Placement Confirmation: ETT inserted through vocal cords under direct vision, positive ETCO2 and breath sounds checked- equal and bilateral Secured at: 23 cm Tube secured with: Tape Dental Injury: Teeth and Oropharynx as per pre-operative assessment

## 2021-12-31 NOTE — Anesthesia Postprocedure Evaluation (Signed)
Anesthesia Post Note  Patient: Kenneth Mason  Procedure(s) Performed: TOTAL THYROIDECTOMY (Bilateral: Neck)     Patient location during evaluation: PACU Anesthesia Type: General Level of consciousness: awake and alert, oriented and patient cooperative Pain management: pain level controlled Vital Signs Assessment: post-procedure vital signs reviewed and stable Respiratory status: spontaneous breathing, nonlabored ventilation and respiratory function stable Cardiovascular status: blood pressure returned to baseline and stable Postop Assessment: no apparent nausea or vomiting Anesthetic complications: no   No notable events documented.  Last Vitals:  Vitals:   12/31/21 0909 12/31/21 1245  BP: (!) 133/92 (!) 142/91  Pulse: 63 (!) 57  Resp: 17 13  Temp: 36.6 C 36.6 C  SpO2: 98% 100%    Last Pain:  Vitals:   12/31/21 1245  PainSc: [name]

## 2021-12-31 NOTE — Discharge Instructions (Signed)
You may shower and use soap and water. Do not use any creams, oils or ointment.  Contact us immediately if you develop any tingling around the lips, muscle cramps or spasms.

## 2021-12-31 NOTE — Interval H&P Note (Signed)
History and Physical Interval Note:  12/31/2021 10:14 AM  Kenneth Mason  has presented today for surgery, with the diagnosis of Thyroid cancer; Hoarseness of voice.  The various methods of treatment have been discussed with the patient and family. After consideration of risks, benefits and other options for treatment, the patient has consented to  Procedure(s): TOTAL THYROIDECTOMY (Bilateral) as a surgical intervention.  The patient's history has been reviewed, patient examined, no change in status, stable for surgery.  I have reviewed the patient's chart and labs.  Questions were answered to the patient's satisfaction.     Izora Gala

## 2021-12-31 NOTE — Progress Notes (Signed)
Pt to 6N12 from PACU s/p thyroidectomy. 1 JP intact and compressed draining bloody drainage. Pt with HOB up 45 degrees and able to eat without difficulty. Pain is tolerable right now but he will let us know when he needs some pain medicine.

## 2021-12-31 NOTE — Op Note (Signed)
OPERATIVE REPORT  DATE OF SURGERY: 12/31/2021  PATIENT:  Kenneth Mason,  46 y.o. male  PRE-OPERATIVE DIAGNOSIS:  Thyroid cancer; Hoarseness of voice  POST-OPERATIVE DIAGNOSIS:  Thyroid cancer; Hoarseness of voice  PROCEDURE:  Procedure(s): TOTAL THYROIDECTOMY  SURGEON:  Beckie Salts, MD  ASSISTANTS: RNFA  ANESTHESIA:   General   EBL: 30 ml  DRAINS: 7 French round JP  LOCAL MEDICATIONS USED: 1% Xylocaine with epinephrine  SPECIMEN: Total thyroidectomy, suture marks left lobe  COUNTS:  Correct  PROCEDURE DETAILS: The patient was taken to the operating room and placed on the operating table in the supine position. A shoulder roll was placed beneath the shoulder blades and the neck was extended. The neck was prepped and draped in a standard fashion. A low collar transverse incision was outlined with a marking pen and was incised with #15 scalpel following local anesthetic infiltration. Dissection was continued down through the platysma layer.  Self-retaining retractors were used throughout the case.  The midline fascia was divided.  The strap muscles were reflected laterally on both sides off of the thyroid isthmus.  The left side was dissected first.  The superior vasculature was separately identified ligated between clamps using the harmonic dissector.  The recurrent nerve and a parathyroid were identified and preserved on the left side.  Middle thyroid vein was divided in a similar fashion.  The inferior vasculature was treated in a similar fashion.  The gland was brought forward and Berry's ligament was dissected off of the trachea.  A primary lobe was dissected off of the upper trachea and brought with the rest of the specimen.  The right side dissection was accomplished in a similar fashion.  The recurrent nerve and a suspected parathyroid were identified and preserved.  The entire specimen was delivered and sent for pathologic evaluation.  Inspection of the pretracheal area did not  reveal any visible or palpable lymph nodes.  The drain was exited through a separate stab incision.  The drain was secured in place with a silk suture. The midline fascia was reapproximated with interrupted chromic suture. The platysma layer was also reapproximated with interrupted chromic suture. A running subcuticular closure was accomplished. Dermabond was used on the skin. The drain was charged. The patient was awakened, extubated and transferred to recovery in stable condition.   PATIENT DISPOSITION:  To PACU, stable

## 2021-12-31 NOTE — Progress Notes (Signed)
ENT Post Operative Note  Subjective: Patient seen and examined at bedside. Reports pain controlled. Has ordered a tray but not had any PO yet. No nausea.  Vitals:   12/31/21 1415 12/31/21 1501  BP: (!) 140/96 (!) 135/90  Pulse: (!) 56 (!) 56  Resp: 13 14  Temp: 97.9 F (36.6 C) 97.6 F (36.4 C)  SpO2: 98% 100%     OBJECTIVE  Gen: alert, cooperative, appropriate Head/ENT: EOMI, mucus membranes moist and pink, conjunctiva clear Midline neck incision C/D/I with dermabond intact. Neck soft, with no evidence of seroma or hematoma. JP drain exiting midline neck with sanguinous drainage. Respiratory: Voice without dysphonia. Non-labored breathing, no accessory muscle use, good O2 saturations on room air Neuro: CN II-XII grossly intact  ASSESS/ PLAN  Kenneth Mason is a 46 y.o. male who is POD 0 from total thyroidectomy.  -Continue observation -Continue JP drain to bulb suction. Empty, record output, and recharge every 6 hours. Reinforce if not holding charge. -Continue MIVF- will saline lock when tolerating PO intake -Encourage IS use, ambulation to tolerance with assist -Pain control -Labs pending  Thank you for allowing me to participate in the care of this patient. Please do not hesitate to contact me with any questions or concerns.   Jason Coop, [facility] Cell: [phone]

## 2022-01-01 ENCOUNTER — Encounter (HOSPITAL_COMMUNITY): Payer: Self-pay | Admitting: Otolaryngology

## 2022-01-01 DIAGNOSIS — C73 Malignant neoplasm of thyroid gland: Secondary | ICD-10-CM | POA: Diagnosis not present

## 2022-01-01 LAB — CALCIUM, IONIZED: Calcium, Ionized, Serum: 5 mg/dL (ref 4.5–5.6)

## 2022-01-01 NOTE — Progress Notes (Signed)
Patient discharged to home with instructions. Escorted to discharge lounge.

## 2022-01-01 NOTE — Discharge Summary (Signed)
Physician Discharge Summary  Patient ID: Kenneth Mason MRN: [id number] DOB/AGE: [birth year] 46 y.o.  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 rate 16, height '5\' 10"'$  (1.778 m), weight 74.4 kg, SpO2 100 %. PHYSICAL EXAM: Awake and alert, voice is clear.  Neck looks excellent.  Drain removed.  Disposition: Discharge disposition: 01-Home or Self Care       Discharge Instructions     Diet - low sodium heart healthy   Complete by: As directed    Increase activity slowly   Complete by: As directed    No wound care   Complete by: As directed       Allergies as of 01/01/2022   No Known Allergies      Medication List     TAKE these medications    HYDROcodone-acetaminophen 7.5-325 MG tablet Commonly known as: Norco Take 1 tablet by mouth every 6 (six) hours as needed for moderate pain.   levothyroxine 100 MCG tablet Commonly known as: Synthroid Take 1 tablet (100 mcg total) by mouth daily.   Odefsey 200-25-25 MG Tabs tablet Generic drug: emtricitabine-rilpivir-tenofovir AF Take 1 tablet by mouth daily with breakfast.   ondansetron 4 MG disintegrating tablet Commonly known as: ZOFRAN-ODT Take 1 tablet (4 mg total) by mouth every 8 (eight) hours as needed for nausea or vomiting.         Signed: Izora Gala 01/01/2022, 8:41 AM

## 2022-01-02 LAB — CALCIUM, IONIZED
Calcium, Ionized, Serum: 4.8 mg/dL (ref 4.5–5.6)
Calcium, Ionized, Serum: 4.6 mg/dL (ref 4.5–5.6)

## 2022-01-05 LAB — SURGICAL PATHOLOGY

## 2022-03-20 ENCOUNTER — Other Ambulatory Visit: Payer: Self-pay | Admitting: Infectious Disease

## 2022-03-20 DIAGNOSIS — B20 Human immunodeficiency virus [HIV] disease: Secondary | ICD-10-CM

## 2022-04-14 ENCOUNTER — Telehealth: Payer: Self-pay | Admitting: General Practice

## 2022-04-14 NOTE — Telephone Encounter (Signed)
Pt called and left a VM in regards to rescheduling his lab appt. There has not been any appt's scheduled for pt this year. Called pt back and left a VM for scheduling. It looks like as of today per prev visit that he is due for his 1 yr f/u.

## 2022-04-15 ENCOUNTER — Other Ambulatory Visit: Payer: Self-pay

## 2022-04-15 ENCOUNTER — Other Ambulatory Visit: Payer: BC Managed Care – PPO

## 2022-04-15 ENCOUNTER — Other Ambulatory Visit: Payer: Self-pay | Admitting: Infectious Disease

## 2022-04-15 DIAGNOSIS — B20 Human immunodeficiency virus [HIV] disease: Secondary | ICD-10-CM

## 2022-04-15 DIAGNOSIS — Z113 Encounter for screening for infections with a predominantly sexual mode of transmission: Secondary | ICD-10-CM

## 2022-04-15 DIAGNOSIS — Z79899 Other long term (current) drug therapy: Secondary | ICD-10-CM

## 2022-04-16 LAB — T-HELPER CELL (CD4) - (RCID CLINIC ONLY)
CD4 % Helper T Cell: 28 % — ABNORMAL LOW (ref 33–65)
CD4 T Cell Abs: 854 /uL (ref 400–1790)

## 2022-04-17 LAB — CBC WITH DIFFERENTIAL/PLATELET
Absolute Monocytes: 448 cells/uL (ref 200–950)
Basophils Absolute: 42 cells/uL (ref 0–200)
Basophils Relative: 0.6 %
Eosinophils Absolute: 77 cells/uL (ref 15–500)
Eosinophils Relative: 1.1 %
HCT: 46.5 % (ref 38.5–50.0)
Hemoglobin: 16 g/dL (ref 13.2–17.1)
Lymphs Abs: 3262 cells/uL (ref 850–3900)
MCH: 29.1 pg (ref 27.0–33.0)
MCHC: 34.4 g/dL (ref 32.0–36.0)
MCV: 84.5 fL (ref 80.0–100.0)
MPV: 8.9 fL (ref 7.5–12.5)
Monocytes Relative: 6.4 %
Neutro Abs: 3171 cells/uL (ref 1500–7800)
Neutrophils Relative %: 45.3 %
Platelets: 276 10*3/uL (ref 140–400)
RBC: 5.5 10*6/uL (ref 4.20–5.80)
RDW: 14.1 % (ref 11.0–15.0)
Total Lymphocyte: 46.6 %
WBC: 7 10*3/uL (ref 3.8–10.8)

## 2022-04-17 LAB — COMPLETE METABOLIC PANEL WITH GFR
AG Ratio: 1.5 (calc) (ref 1.0–2.5)
ALT: 17 U/L (ref 9–46)
AST: 19 U/L (ref 10–40)
Albumin: 4.3 g/dL (ref 3.6–5.1)
Alkaline phosphatase (APISO): 90 U/L (ref 36–130)
BUN/Creatinine Ratio: 9 (calc) (ref 6–22)
BUN: 12 mg/dL (ref 7–25)
CO2: 33 mmol/L — ABNORMAL HIGH (ref 20–32)
Calcium: 9.4 mg/dL (ref 8.6–10.3)
Chloride: 104 mmol/L (ref 98–110)
Creat: 1.31 mg/dL — ABNORMAL HIGH (ref 0.60–1.29)
Globulin: 2.8 g/dL (calc) (ref 1.9–3.7)
Glucose, Bld: 74 mg/dL (ref 65–99)
Potassium: 4.2 mmol/L (ref 3.5–5.3)
Sodium: 142 mmol/L (ref 135–146)
Total Bilirubin: 0.4 mg/dL (ref 0.2–1.2)
Total Protein: 7.1 g/dL (ref 6.1–8.1)
eGFR: 69 mL/min/{1.73_m2} (ref >=60)

## 2022-04-17 LAB — LIPID PANEL
Cholesterol: 163 mg/dL (ref <200)
HDL: 43 mg/dL (ref >=40)
LDL Cholesterol (Calc): 96 mg/dL (calc)
Non-HDL Cholesterol (Calc): 120 mg/dL (calc) (ref <130)
Total CHOL/HDL Ratio: 3.8 (calc) (ref <5.0)
Triglycerides: 144 mg/dL (ref <150)

## 2022-04-17 LAB — HIV-1 RNA QUANT-NO REFLEX-BLD
HIV 1 RNA Quant: NOT DETECTED Copies/mL
HIV-1 RNA Quant, Log: NOT DETECTED Log cps/mL

## 2022-04-17 LAB — RPR: RPR Ser Ql: NONREACTIVE

## 2022-04-29 ENCOUNTER — Ambulatory Visit (INDEPENDENT_AMBULATORY_CARE_PROVIDER_SITE_OTHER): Payer: BC Managed Care – PPO | Admitting: Infectious Disease

## 2022-04-29 ENCOUNTER — Encounter: Payer: Self-pay | Admitting: Infectious Disease

## 2022-04-29 ENCOUNTER — Other Ambulatory Visit (HOSPITAL_COMMUNITY)
Admission: RE | Admit: 2022-04-29 | Discharge: 2022-04-29 | Disposition: A | Discharge status: Home / Self Care | Payer: BC Managed Care – PPO | Source: Ambulatory Visit | Attending: Infectious Disease | Admitting: Infectious Disease

## 2022-04-29 ENCOUNTER — Other Ambulatory Visit: Payer: Self-pay

## 2022-04-29 VITALS — BP 137/90 | HR 54 | Resp 16 | Ht 70.0 in | Wt 173.0 lb

## 2022-04-29 DIAGNOSIS — C73 Malignant neoplasm of thyroid gland: Secondary | ICD-10-CM

## 2022-04-29 DIAGNOSIS — E785 Hyperlipidemia, unspecified: Secondary | ICD-10-CM

## 2022-04-29 DIAGNOSIS — A630 Anogenital (venereal) warts: Secondary | ICD-10-CM

## 2022-04-29 DIAGNOSIS — B20 Human immunodeficiency virus [HIV] disease: Secondary | ICD-10-CM | POA: Diagnosis not present

## 2022-04-29 DIAGNOSIS — R85619 Unspecified abnormal cytological findings in specimens from anus: Secondary | ICD-10-CM

## 2022-04-29 DIAGNOSIS — Z7185 Encounter for immunization safety counseling: Secondary | ICD-10-CM

## 2022-04-29 HISTORY — DX: Hyperlipidemia, unspecified: E78.5

## 2022-04-29 MED ORDER — ATORVASTATIN CALCIUM 10 MG PO TABS: 10.0000 mg | ORAL_TABLET | Freq: Every day | ORAL | 11 refills | Status: DC

## 2022-04-29 MED ORDER — ODEFSEY 200-25-25 MG PO TABS: ORAL_TABLET | ORAL | 11 refills | Status: DC

## 2022-04-29 NOTE — Progress Notes (Signed)
Chief complaint: Follow-up for HIV disease on medications recently diagnosed with thyroid cancer status post resection by ENT Subjective:    Patient ID: Kenneth Mason, male    DOB: September 18, 1975, 46 y.o.   MRN: [id number]  HPI  46 year old man with HIV, started on complera started in January 2014 with perfect virological suppresion and healthy CD4, now on ODEFSEY. .  When I last saw him he had a nodule in his thyroid which I referred him for biopsy this turned out to be thyroid cancer and he had a thyroidectomy.  He  He told me that the surgeon told him that he might have lymph node involvement but this was not clear yet.  Sounds like he is scheduled for radio iodide thyroid scan with endocrinology.  We discussed the results of the reprieve study I would like to initiate atorvastatin since the drug in the study does not appear likely be covered by his insurance.  He is also had a history of anal warts in the past and we recommended screening for anal cancer with anal Pap smear which we have done today.            Past Medical History:  Diagnosis Date   Anal wart 11/21/2014   Anxiety    Benign rectal wall submucosal cyst s/p TEM partial proctectomy Jan2013     Depression    Flank pain 08/16/2020   Headache in front of head    q 2 weeks. No n/v, no photophobia. Responds to NSAIDs   HIV disease ([place]) 05/13/2015   HIV infection ([employer])    dx'd HIV July 12; CD4 1100.  Presented with lymphadenopathy   Hyperlipidemia 04/29/2022   Insomnia 12/05/2015   Need for prophylactic vaccination against Streptococcus pneumoniae (pneumococcus) 12/07/2017   Papillary thyroid carcinoma ([place]) 12/31/2021   Rectal polyp 1999   polyp excised   Rib pain 08/16/2020   RUQ pain 08/16/2020   Screen for STD (sexually transmitted disease) 05/13/2015   Thyroid nodule 04/14/2021    Past Surgical History:  Procedure Laterality Date   BIOPSY THYROID  10/10/2021   DENTAL SURGERY  06/01/2008   Wisdom  Teeth    EUS  04/09/2011   Procedure: LOWER ENDOSCOPIC ULTRASOUND (EUS);  Surgeon: Owens Loffler, MD;  Location: [facility];  Service: Endoscopy;  Laterality: N/A;   FLEXIBLE SIGMOIDOSCOPY  04/09/2011   Procedure: FLEXIBLE SIGMOIDOSCOPY;  Surgeon: Owens Loffler, MD;  Location: [facility];  Service: Endoscopy;  Laterality: N/A;   THYROIDECTOMY Bilateral 12/31/2021   Procedure: TOTAL THYROIDECTOMY;  Surgeon: Izora Gala, MD;  Location: [facility];  Service: ENT;  Laterality: Bilateral;   TRANSANAL ENDOSCOPIC MICROSURGERY  06/12/2011   Procedure: TRANSANAL ENDOSCOPIC MICROSURGERY;  Surgeon: Adin Hector, MD;  Location: [facility];  Service: General;  Laterality: N/A;  partial proctectomy by TEM    Family History  Problem Relation Age of Onset   Hypertension Mother    Cancer Maternal Grandfather        unsure of type    Liver cancer Paternal Grandmother    Stroke Paternal Grandmother    Heart disease Paternal Grandfather        CAD/MI-fatal      Social History   Socioeconomic History   Marital status: Divorced    Spouse name: Not on file   Number of children: 2   Years of education: 14   Highest education level: Not on file  Occupational History   Occupation: Therapist, art   Tobacco Use  Smoking status: Never   Smokeless tobacco: Never  Substance and Sexual Activity   Alcohol use: Not Currently    Comment: very rarely- few times a year   Drug use: No   Sexual activity: Yes    Partners: Male    Birth control/protection: Condom    Comment: given condoms  Other Topics Concern   Not on file  Social History Narrative   HSG, 2 yrs college. Married '04 - seperated (2012). !son ' 2005; 1  dtr - '08. 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  Lives with children. Wife/ mother is abroad (aug '12).   Social Determinants of Health   Financial Resource Strain: Not on file  Food Insecurity: Not on  file  Transportation Needs: Not on file  Physical Activity: Not on file  Stress: Not on file  Social Connections: Not on file    No Known Allergies   Current Outpatient Medications:    atorvastatin (LIPITOR) 10 MG tablet, Take 1 tablet (10 mg total) by mouth daily., Disp: 30 tablet, Rfl: 11   levothyroxine (SYNTHROID) 100 MCG tablet, Take 1 tablet (100 mcg total) by mouth daily., Disp: 30 tablet, Rfl: 6   emtricitabine-rilpivir-tenofovir AF (ODEFSEY) 200-25-25 MG TABS tablet, TAKE ONE TABLET BY MOUTH ONCE DAILY WITH BREAKFAST., Disp: 30 tablet, Rfl: 11   HYDROcodone-acetaminophen (NORCO) 7.5-325 MG tablet, Take 1 tablet by mouth every 6 (six) hours as needed for moderate pain., Disp: 20 tablet, Rfl: 0   ondansetron (ZOFRAN-ODT) 4 MG disintegrating tablet, Take 1 tablet (4 mg total) by mouth every 8 (eight) hours as needed for nausea or vomiting., Disp: 20 tablet, Rfl: 0   Review of Systems  Constitutional:  Negative for activity change, appetite change, chills, diaphoresis, fatigue, fever and unexpected weight change.  HENT:  Negative for congestion, rhinorrhea, sinus pressure, sneezing, sore throat and trouble swallowing.   Eyes:  Negative for photophobia and visual disturbance.  Respiratory:  Negative for cough, chest tightness, shortness of breath, wheezing and stridor.   Cardiovascular:  Negative for chest pain, palpitations and leg swelling.  Gastrointestinal:  Negative for abdominal distention, abdominal pain, anal bleeding, blood in stool, constipation, diarrhea, nausea and vomiting.  Genitourinary:  Negative for difficulty urinating, dysuria, flank pain and hematuria.  Musculoskeletal:  Negative for arthralgias, back pain, gait problem, joint swelling and myalgias.  Skin:  Negative for color change, pallor, rash and wound.  Neurological:  Negative for dizziness, tremors, weakness and light-headedness.  Hematological:  Negative for adenopathy. Does not bruise/bleed easily.   Psychiatric/Behavioral:  Negative for agitation, behavioral problems, confusion, decreased concentration, dysphoric mood and sleep disturbance.        Objective:   Physical Exam Exam conducted with a chaperone present.  Constitutional:      Appearance: He is well-developed.  HENT:     Head: Normocephalic and atraumatic.  Eyes:     Conjunctiva/sclera: Conjunctivae normal.  Cardiovascular:     Rate and Rhythm: Normal rate and regular rhythm.  Pulmonary:     Effort: Pulmonary effort is normal. No respiratory distress.     Breath sounds: No wheezing.  Abdominal:     General: There is no distension.     Palpations: Abdomen is soft.  Musculoskeletal:        General: No tenderness. Normal range of motion.     Cervical back: Normal range of motion and neck supple.  Skin:    General: Skin  is warm and dry.     Coloration: Skin is not pale.     Findings: No erythema or rash.  Neurological:     Mental Status: He is alert and oriented to person, place, and time.  Psychiatric:        Mood and Affect: Mood normal.        Behavior: Behavior normal.        Thought Content: Thought content normal.        Judgment: Judgment normal.         Assessment & Plan:  HIV disease:   I will add order HIV viral load CD4 count CBC with differential CMP, RPR GC and chlamydia and I will continue  [place] prescription   Thyroid cancer follow-up with endocrinology.  Hyperlipidemia will initiate atorvastatin.  Screening for anal cancer.  Obtain an anal Pap smear today.  Vaccine counseling recommended flu and COVID-19 vaccination.

## 2022-05-06 LAB — CYTOLOGY - PAP
Adequacy: ABSENT
Comment: NEGATIVE
Diagnosis: UNDETERMINED — AB
High risk HPV: POSITIVE — AB

## 2022-05-07 ENCOUNTER — Other Ambulatory Visit: Payer: Self-pay | Admitting: Infectious Disease

## 2022-05-07 ENCOUNTER — Telehealth: Payer: Self-pay

## 2022-05-07 ENCOUNTER — Encounter: Payer: Self-pay | Admitting: Infectious Disease

## 2022-05-07 DIAGNOSIS — R85610 Atypical squamous cells of undetermined significance on cytologic smear of anus (ASC-US): Secondary | ICD-10-CM

## 2022-05-07 HISTORY — DX: Atypical squamous cells of undetermined significance on cytologic smear of anus (ASC-US): R85.610

## 2022-05-07 NOTE — Telephone Encounter (Signed)
-----   Message from Truman Hayward, MD sent at 05/06/2022  5:44 PM EST ----- He needs to get seen by [facility] for HRA based on abnormal anal pap smear ----- Message ----- From: Interface, [facility] Sent: 05/06/2022   2:21 PM EST To: Truman Hayward, MD

## 2022-05-07 NOTE — Telephone Encounter (Signed)
Referral placed for [facility]. Spoke with [place], discussed abnormal pap results need additional testing to determine if any type of treatment is warranted. Notified him that referral was placed. Asked him to please let us know if he hasn't heard from [facility] by mid-next week. Patient verbalized understanding and has no further questions.   Beryle Flock, RN

## 2022-05-07 NOTE — Addendum Note (Signed)
Addended by: Lucie Leather D on: 05/07/2022 10:01 AM   Modules accepted: Orders

## 2022-05-15 ENCOUNTER — Encounter: Payer: Self-pay | Admitting: "Endocrinology

## 2022-05-15 ENCOUNTER — Ambulatory Visit (INDEPENDENT_AMBULATORY_CARE_PROVIDER_SITE_OTHER): Payer: BC Managed Care – PPO | Admitting: "Endocrinology

## 2022-05-15 VITALS — BP 124/86 | HR 60 | Ht 70.0 in | Wt 168.0 lb

## 2022-05-15 DIAGNOSIS — C73 Malignant neoplasm of thyroid gland: Secondary | ICD-10-CM | POA: Diagnosis not present

## 2022-05-15 DIAGNOSIS — E890 Postprocedural hypothyroidism: Secondary | ICD-10-CM | POA: Diagnosis not present

## 2022-05-15 NOTE — Progress Notes (Unsigned)
Endocrinology Consult Note                                            05/15/2022, 11:56 AM   Subjective:    Patient ID: Kenneth Mason, male    DOB: 1976-05-25, PCP Patient, No Pcp Per   Past Medical History:  Diagnosis Date   Anal wart 11/21/2014   Anxiety    Benign rectal wall submucosal cyst s/p TEM partial proctectomy Jan2013     Depression    Flank pain 08/16/2020   Headache in front of head    q 2 weeks. No n/v, no photophobia. Responds to NSAIDs   HIV disease ([place]) 05/13/2015   HIV infection ([place])    dx'd HIV July 12; CD4 1100.  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   Rectal polyp 1999   polyp excised   Rib pain 08/16/2020   RUQ pain 08/16/2020   Screen for STD (sexually transmitted disease) 05/13/2015   Thyroid nodule 04/14/2021   Past Surgical History:  Procedure Laterality Date   BIOPSY THYROID  10/10/2021   DENTAL SURGERY  06/01/2008   Wisdom Teeth    EUS  04/09/2011   Procedure: LOWER ENDOSCOPIC ULTRASOUND (EUS);  Surgeon: Owens Loffler, MD;  Location: [facility];  Service: Endoscopy;  Laterality: N/A;   FLEXIBLE SIGMOIDOSCOPY  04/09/2011   Procedure: FLEXIBLE SIGMOIDOSCOPY;  Surgeon: Owens Loffler, MD;  Location: [facility];  Service: Endoscopy;  Laterality: N/A;   THYROIDECTOMY Bilateral 12/31/2021   Procedure: TOTAL THYROIDECTOMY;  Surgeon: Izora Gala, MD;  Location: [facility];  Service: ENT;  Laterality: Bilateral;   TRANSANAL ENDOSCOPIC MICROSURGERY  06/12/2011   Procedure: TRANSANAL ENDOSCOPIC MICROSURGERY;  Surgeon: Adin Hector, MD;  Location: [facility];  Service: General;  Laterality: N/A;  partial proctectomy by TEM   Social History   Socioeconomic History   Marital status: Divorced    Spouse name: Not on file    Number of children: 2   Years of education: 14   Highest education level: Not on file  Occupational History   Occupation: Customer Service   Tobacco Use   Smoking status: Never   Smokeless tobacco: Never  Substance and Sexual Activity   Alcohol use: Not Currently    Comment: very rarely- few times a year   Drug use: No   Sexual activity: Yes    Partners: Male    Birth control/protection: Condom    Comment: given condoms  Other Topics Concern   Not on file  Social History Narrative   HSG, 2 yrs college. Married '04 - seperated (2012). !son ' 2005; 1  dtr - '08. 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  Lives with children. Wife/ mother is abroad (aug '12).   Social Determinants of Health   Financial Resource Strain: Not on file  Food Insecurity: Not on file  Transportation Needs: Not on file  Physical Activity: Not on file  Stress: Not on file  Social Connections: Not on file   Family History  Problem Relation Age of Onset   Hypertension Mother    Cancer Maternal  Grandfather        unsure of type    Liver cancer Paternal Grandmother    Stroke Paternal Grandmother    Heart disease Paternal Grandfather        CAD/MI-fatal   Outpatient Encounter Medications as of 05/15/2022  Medication Sig   atorvastatin (LIPITOR) 10 MG tablet Take 1 tablet (10 mg total) by mouth daily.   emtricitabine-rilpivir-tenofovir AF (ODEFSEY) 200-25-25 MG TABS tablet TAKE ONE TABLET BY MOUTH ONCE DAILY WITH BREAKFAST.   levothyroxine (SYNTHROID) 100 MCG tablet Take 1 tablet (100 mcg total) by mouth daily.   [DISCONTINUED] HYDROcodone-acetaminophen (NORCO) 7.5-325 MG tablet Take 1 tablet by mouth every 6 (six) hours as needed for moderate pain.   [DISCONTINUED] ondansetron (ZOFRAN-ODT) 4 MG disintegrating tablet Take 1 tablet (4 mg total) by mouth every 8 (eight) hours as needed for nausea or vomiting.   No  facility-administered encounter medications on file as of 05/15/2022.   ALLERGIES: No Known Allergies  VACCINATION STATUS: Immunization History  Administered Date(s) Administered   Hepatitis A 12/15/2010, 05/19/2011   Hepatitis B 12/15/2010, 05/19/2011, 06/28/2012   Influenza Split 05/19/2011, 06/28/2012   Influenza,inj,Quad PF,6+ Mos 06/20/2013, 05/06/2016, 05/03/2017, 04/14/2021   Meningococcal Mcv4o 05/03/2017, 12/07/2017   PFIZER(Purple Top)SARS-COV-2 Vaccination 08/11/2019, 09/06/2019   Pfizer Covid-19 Vaccine Bivalent Booster 36yr & up 04/14/2021   Pneumococcal Conjugate-13 12/07/2017   Pneumococcal Polysaccharide-23 12/26/2018   Td 12/15/2010   Tdap 12/15/2010    HPI Kenneth Mason 46y.o. male who presents today with a medical history as above. he is being seen in consultation for postsurgical hypothyroidism, history of thyroid cancer requested by Dr. JLucien Mons MD . History is obtained directly from the patient as well as chart review. Review of his medical records indicated that he underwent total thyroidectomy in August 2023 after workup indicated thyroid malignancy.  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  - Microscopic focus confined within left lobe.  - Margins negative for involvement.   He is currently on levothyroxine 100 mcg p.o. daily before breakfast with reasonable consistency and compliance.  He does not have recent thyroid function tests.  He denies dysphagia, shortness of breath, nor voice change.  He is recovering from the surgery very well. Patient did have bilateral thyroid nodules on presurgical ultrasound.  He denies exposure to neck radiation, no family history of thyroid dysfunction or thyroid malignancy.  Review of Systems  Constitutional: no recent weight gain/loss, no fatigue, no subjective hyperthermia, no  subjective hypothermia Eyes: no blurry vision, no xerophthalmia ENT: no sore throat, no nodules palpated in throat, no dysphagia/odynophagia, no hoarseness Cardiovascular: no Chest Pain, no Shortness of Breath, no palpitations, no leg swelling Respiratory: no cough, no shortness of breath Gastrointestinal: no Nausea/Vomiting/Diarhhea Musculoskeletal: no muscle/joint aches Skin: no rashes Neurological: no tremors, no numbness, no tingling, no dizziness Psychiatric: no depression, no anxiety  Objective:       05/15/2022    8:37 AM 04/29/2022    3:49 PM 01/01/2022    7:42 AM  Vitals with BMI  Height '5\' 10"'$  '5\' 10"'$    Weight 168 lbs 173 lbs   BMI 265.78246.96  Systolic [id number] Diastolic 86 90 87  Pulse 60 54 63    BP 124/86   Pulse 60   Ht '5\' 10"'$  (1.778 m)   Wt 168 lb (76.2 kg)  BMI 24.11 kg/m   Wt Readings from Last 3 Encounters:  05/15/22 168 lb (76.2 kg)  04/29/22 173 lb (78.5 kg)  12/31/21 164 lb (74.4 kg)    Physical Exam  Constitutional:  Body mass index is 24.11 kg/m.,  not in acute distress, normal state of mind Eyes: PERRLA, EOMI, no exophthalmos ENT: moist mucous membranes, post thyroidectomy surgical scar on anterior lower neck,  no gross cervical lymphadenopathy Cardiovascular: normal precordial activity, Regular Rate and Rhythm, no Murmur/Rubs/Gallops Respiratory:  adequate breathing efforts, no gross chest deformity, Clear to auscultation bilaterally Gastrointestinal: abdomen soft, Non -tender, No distension, Bowel Sounds present, no gross organomegaly Musculoskeletal: no gross deformities, strength intact in all four extremities Skin: moist, warm, no rashes Neurological: no tremor with outstretched hands, Deep tendon reflexes normal in bilateral lower extremities.  CMP ( most recent) CMP     Component Value Date/Time   NA 142 04/15/2022 1519   K 4.2 04/15/2022 1519   CL 104 04/15/2022 1519   CO2 33 (H) 04/15/2022 1519   GLUCOSE 74 04/15/2022  1519   BUN 12 04/15/2022 1519   CREATININE 1.31 (H) 04/15/2022 1519   CALCIUM 9.4 04/15/2022 1519   PROT 7.1 04/15/2022 1519   ALBUMIN 4.0 11/02/2016 1203   AST 19 04/15/2022 1519   ALT 17 04/15/2022 1519   ALKPHOS 80 11/02/2016 1203   BILITOT 0.4 04/15/2022 1519   GFRNONAA 67 07/11/2020 1201   GFRAA 78 07/11/2020 1201     Diabetic Labs (most recent): Lab Results  Component Value Date   MICROALBUR 0.50 12/12/2013     Lipid Panel ( most recent) Lipid Panel     Component Value Date/Time   CHOL 163 04/15/2022 1519   TRIG 144 04/15/2022 1519   HDL 43 04/15/2022 1519   CHOLHDL 3.8 04/15/2022 1519   VLDL 10 11/02/2016 1203   LDLCALC 96 04/15/2022 1519        Assessment & Plan:   1. Postsurgical hypothyroidism 2. Malignant neoplasm of thyroid gland ([place])  - Kenneth Mason  is being seen at a kind request of Patient, No Pcp Per. - I have reviewed his available thyroid records and clinically evaluated the patient. 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  I had a long discussion with him about approaches of therapy for thyroid cancer including surgery, radioactive iodine thyroid remnant ablation, and subsequent follow-up.  Considering his microscopic, unifocal, unilateral disease, he is low risk for tumor recurrence and hence may not need radioactive iodine treatment at this time. However, he will be sent to lab for thyroglobulin, thyroglobulin bodies, and thyroid function tests to make a better decision. His labs will also include compressive metabolic panel. For his postsurgical hypothyroidism, he is advised to continue on his current dose of levothyroxine at 100 mcg p.o. daily before breakfast.  - We discussed about the correct intake of his thyroid hormone, on empty stomach at fasting, with water, separated by at least 30 minutes  from breakfast and other medications,  and separated by more than 4 hours from calcium, iron, multivitamins, acid reflux medications (PPIs). -Patient is made aware of the fact that thyroid hormone replacement is needed for life, dose to be adjusted by periodic monitoring of thyroid function tests.  - I did not initiate any new prescriptions today.  He is requesting virtual follow-up for his next visit. - he is advised to maintain  close follow up with his PCP for primary care needs.     - Time spent with the patient: 50 minutes, of which >50% was spent in  counseling him about his postsurgical hypothyroidism, thyroid malignancy and the rest in obtaining information about his symptoms, reviewing his previous labs/studies ( including abstractions from other facilities),  evaluations, and treatments,  and developing a plan to confirm diagnosis and long term treatment based on the latest standards of care/guidelines; and documenting his care.  Kenneth Mason participated in the discussions, expressed understanding, and voiced agreement with the above plans.  All questions were answered to his satisfaction. he is encouraged to contact clinic should he have any questions or concerns prior to his return visit.  Follow up plan: Return in about 1 week (around 05/22/2022), or Virtual, for Labs Today- Non-Fasting Ok.   Kenneth Lloyd, MD [facility] [address] [place], [place] [address] Phone: [phone]  Fax: [phone]     05/15/2022, 11:56 AM  This note was partially dictated with voice recognition software. Similar sounding words can be transcribed inadequately or may not  be corrected upon review.

## 2022-05-22 ENCOUNTER — Encounter: Payer: Self-pay | Admitting: "Endocrinology

## 2022-05-22 ENCOUNTER — Ambulatory Visit (INDEPENDENT_AMBULATORY_CARE_PROVIDER_SITE_OTHER): Payer: BC Managed Care – PPO | Admitting: "Endocrinology

## 2022-05-22 VITALS — Ht 70.0 in | Wt 170.0 lb

## 2022-05-22 DIAGNOSIS — E890 Postprocedural hypothyroidism: Secondary | ICD-10-CM

## 2022-05-22 NOTE — Progress Notes (Signed)
05/22/2022, 2:32 PM  This is a telehealth visit via telephone Subjective:    Patient ID: Kenneth Mason, male    DOB: Oct 24, 1975, PCP Patient, No Pcp Per   Past Medical History:  Diagnosis Date   Anal wart 11/21/2014   Anxiety    Benign rectal wall submucosal cyst s/p TEM partial proctectomy Jan2013     Depression    Flank pain 08/16/2020   Headache in front of head    q 2 weeks. No n/v, no photophobia. Responds to NSAIDs   HIV disease ([place]) 05/13/2015   HIV infection ([place])    dx'd HIV July 12; CD4 1100.  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   Rectal polyp 1999   polyp excised   Rib pain 08/16/2020   RUQ pain 08/16/2020   Screen for STD (sexually transmitted disease) 05/13/2015   Thyroid nodule 04/14/2021   Past Surgical History:  Procedure Laterality Date   BIOPSY THYROID  10/10/2021   DENTAL SURGERY  06/01/2008   Wisdom Teeth    EUS  04/09/2011   Procedure: LOWER ENDOSCOPIC ULTRASOUND (EUS);  Surgeon: Owens Loffler, MD;  Location: [facility];  Service: Endoscopy;  Laterality: N/A;   FLEXIBLE SIGMOIDOSCOPY  04/09/2011   Procedure: FLEXIBLE SIGMOIDOSCOPY;  Surgeon: Owens Loffler, MD;  Location: [facility];  Service: Endoscopy;  Laterality: N/A;   THYROIDECTOMY Bilateral 12/31/2021   Procedure: TOTAL THYROIDECTOMY;  Surgeon: Izora Gala, MD;  Location: [facility];  Service: ENT;  Laterality: Bilateral;   TRANSANAL ENDOSCOPIC MICROSURGERY  06/12/2011   Procedure: TRANSANAL ENDOSCOPIC MICROSURGERY;  Surgeon: Adin Hector, MD;  Location[facility];  Service: General;  Laterality: N/A;  partial proctectomy by TEM   Social History   Socioeconomic History   Marital status: Divorced    Spouse name:  Not on file   Number of children: 2   Years of education: 14   Highest education level: Not on file  Occupational History   Occupation: Customer Service   Tobacco Use   Smoking status: Never   Smokeless tobacco: Never  Substance and Sexual Activity   Alcohol use: Not Currently    Comment: very rarely- few times a year   Drug use: No   Sexual activity: Yes    Partners: Male    Birth control/protection: Condom    Comment: given condoms  Other Topics Concern   Not on file  Social History Narrative   HSG, 2 yrs college. Married '04 - seperated (2012). !son ' 2005; 1  dtr - '08. 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  Lives with children. Wife/ mother is abroad (aug '12).   Social Determinants of Health   Financial Resource Strain: Not on file  Food Insecurity: Not on file  Transportation Needs: Not on file  Physical Activity: Not on file  Stress: Not on file  Social Connections: Not on file   Family History  Problem Relation Age of Onset   Hypertension Mother  Cancer Maternal Grandfather        unsure of type    Liver cancer Paternal Grandmother    Stroke Paternal Grandmother    Heart disease Paternal Grandfather        CAD/MI-fatal   Outpatient Encounter Medications as of 05/22/2022  Medication Sig   atorvastatin (LIPITOR) 10 MG tablet Take 1 tablet (10 mg total) by mouth daily.   emtricitabine-rilpivir-tenofovir AF (ODEFSEY) 200-25-25 MG TABS tablet TAKE ONE TABLET BY MOUTH ONCE DAILY WITH BREAKFAST.   levothyroxine (SYNTHROID) 100 MCG tablet Take 1 tablet (100 mcg total) by mouth daily.   No facility-administered encounter medications on file as of 05/22/2022.   ALLERGIES: No Known Allergies  VACCINATION STATUS: Immunization History  Administered Date(s) Administered   Hepatitis A 12/15/2010, 05/19/2011   Hepatitis B 12/15/2010, 05/19/2011, 06/28/2012   Influenza Split  05/19/2011, 06/28/2012   Influenza,inj,Quad PF,6+ Mos 06/20/2013, 05/06/2016, 05/03/2017, 04/14/2021   Meningococcal Mcv4o 05/03/2017, 12/07/2017   PFIZER(Purple Top)SARS-COV-2 Vaccination 08/11/2019, 09/06/2019   Pfizer Covid-19 Vaccine Bivalent Booster 31yr & up 04/14/2021   Pneumococcal Conjugate-13 12/07/2017   Pneumococcal Polysaccharide-23 12/26/2018   Td 12/15/2010   Tdap 12/15/2010    HPI Kenneth GVerrilliis 46y.o. male who presents today with a medical history as above. he is being engaged over telephone after he was seen in consultation for  postsurgical hypothyroidism, history of thyroid cancer requested by Dr. JLucien Mons MD . -See notes from previous visit. Review of his medical records indicated that he underwent total thyroidectomy in August 2023 after workup indicated thyroid malignancy.  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  - Microscopic focus confined within left lobe.  - Margins negative for involvement.   He is currently on levothyroxine 100 mcg p.o. daily before breakfast with reasonable consistency and compliance.  He does not have recent thyroid function tests.  He denies dysphagia, shortness of breath, nor voice change.  He is recovering from the surgery very well. Patient did have bilateral thyroid nodules on presurgical ultrasound.  He denies exposure to neck radiation, no family history of thyroid dysfunction or thyroid malignancy.  He was sent for new set of labs after his last visit.  He has no new complaints today.  Review of Systems   Objective:       05/22/2022    9:54 AM 05/15/2022    8:37 AM 04/29/2022    3:49 PM  Vitals with BMI  Height '5\' 10"'$  '5\' 10"'$  '5\' 10"'$   Weight 170 lbs 168 lbs 173 lbs  BMI 24.39 260.63201.60 Systolic  [id number] Diastolic  86 90  Pulse  60 54    Ht '5\' 10"'$  (1.778 m)   Wt 170 lb (77.1  kg)   BMI 24.39 kg/m   Wt Readings from Last 3 Encounters:  05/22/22 170 lb (77.1 kg)  05/15/22 168 lb (76.2 kg)  04/29/22 173 lb (78.5 kg)    Physical Exam  Constitutional:  Body mass index is 24.39 kg/m.,  not in acute distress, normal state of mind Eyes: PERRLA, EOMI, no exophthalmos ENT: moist mucous membranes, post thyroidectomy surgical scar on anterior lower neck,  no gross cervical lymphadenopathy   CMP ( most recent) CMP     Component Value Date/Time   NA 141 05/15/2022 0917   K 4.1 05/15/2022 0917   CL 102 05/15/2022 0917   CO2  28 05/15/2022 0917   GLUCOSE 86 05/15/2022 0917   GLUCOSE 74 04/15/2022 1519   BUN 14 05/15/2022 0917   CREATININE 1.30 (H) 05/15/2022 0917   CREATININE 1.31 (H) 04/15/2022 1519   CALCIUM 9.3 05/15/2022 0917   PROT 6.8 05/15/2022 0917   ALBUMIN 4.3 05/15/2022 0917   AST 20 05/15/2022 0917   ALT 17 05/15/2022 0917   ALKPHOS 93 05/15/2022 0917   BILITOT 0.4 05/15/2022 0917   GFRNONAA 67 07/11/2020 1201   GFRAA 78 07/11/2020 1201     Diabetic Labs (most recent): Lab Results  Component Value Date   MICROALBUR 0.50 12/12/2013     Lipid Panel ( most recent) Lipid Panel     Component Value Date/Time   CHOL 163 04/15/2022 1519   TRIG 144 04/15/2022 1519   HDL 43 04/15/2022 1519   CHOLHDL 3.8 04/15/2022 1519   VLDL 10 11/02/2016 1203   LDLCALC 96 04/15/2022 1519      Latest Reference Range & Units 05/15/22 09:17  TSH 0.450 - 4.500 uIU/mL 2.550  T4,Free(Direct) 0.82 - 1.77 ng/dL 1.74  Thyroglobulin Antibody 0.0 - 0.9 IU/mL <1.0  THYROGLOBULIN (TG-RIA)  WILL FOLLOW (P)  (P): Preliminary   Assessment & Plan:   1. Postsurgical hypothyroidism 2. Malignant neoplasm of thyroid gland [facility]) -Patient was recently seen as a consult status post thyroidectomy.  he has  had 0.5 cm unilateral, unifocal follicular variant papillary thyroid cancer status post total thyroidectomy and currently with postsurgical hypothyroidism.  I had a  long discussion with him about approaches of therapy for thyroid cancer including surgery, radioactive iodine thyroid remnant ablation, and subsequent follow-up.  Considering his microscopic, unifocal, unilateral disease, he is low risk for tumor recurrence and hence may not need radioactive iodine treatment at this time.  His previsit labs are consistent with appropriate replacement.  He is advised to continue levothyroxine at 100 mcg p.o. daily before breakfast.   - We discussed about the correct intake of his thyroid hormone, on empty stomach at fasting, with water, separated by at least 30 minutes from breakfast and other medications,  and separated by more than 4 hours from calcium, iron, multivitamins, acid reflux medications (PPIs). -Patient is made aware of the fact that thyroid hormone replacement is needed for life, dose to be adjusted by periodic monitoring of thyroid function tests. -He has normal calcium, slightly above target serum creatinine.  He is advised to avoid dehydration.   - he is advised to maintain close follow up with his PCP for primary care needs.    I spent 22 minutes in the care of the patient today including review of labs from Thyroid Function, CMP, and other relevant labs ; imaging/biopsy records (current and previous including abstractions from other facilities); telephone engagement to discuss his lab results and symptoms, medications doses, his options of short and long term treatment based on the latest standards of care / guidelines;   and documenting the encounter.  Alphonse Guild  participated in the discussions, expressed understanding, and voiced agreement with the above plans.  All questions were answered to his satisfaction. he is encouraged to contact clinic should he have any questions or concerns prior to his return visit.   Follow up plan: Return in about 3 months (around 08/21/2022) for F/U with Pre-visit Labs.   Glade Lloyd, MD [facility] [address] [place], [place] [address] Phone: [phone]  Fax: [phone]     05/22/2022, 2:32 PM  This note was partially dictated with voice recognition software. Similar sounding words can be transcribed inadequately or may not  be corrected upon review.

## 2022-05-24 LAB — COMPREHENSIVE METABOLIC PANEL
ALT: 17 IU/L (ref 0–44)
AST: 20 IU/L (ref 0–40)
Albumin/Globulin Ratio: 1.7 (ref 1.2–2.2)
Albumin: 4.3 g/dL (ref 4.1–5.1)
Alkaline Phosphatase: 93 IU/L (ref 44–121)
BUN/Creatinine Ratio: 11 (ref 9–20)
BUN: 14 mg/dL (ref 6–24)
Bilirubin Total: 0.4 mg/dL (ref 0.0–1.2)
CO2: 28 mmol/L (ref 20–29)
Calcium: 9.3 mg/dL (ref 8.7–10.2)
Chloride: 102 mmol/L (ref 96–106)
Creatinine, Ser: 1.3 mg/dL — ABNORMAL HIGH (ref 0.76–1.27)
Globulin, Total: 2.5 g/dL (ref 1.5–4.5)
Glucose: 86 mg/dL (ref 70–99)
Potassium: 4.1 mmol/L (ref 3.5–5.2)
Sodium: 141 mmol/L (ref 134–144)
Total Protein: 6.8 g/dL (ref 6.0–8.5)
eGFR: 69 mL/min/{1.73_m2} (ref >59)

## 2022-05-24 LAB — TSH: TSH: 2.55 u[IU]/mL (ref 0.450–4.500)

## 2022-05-24 LAB — T4, FREE: Free T4: 1.74 ng/dL (ref 0.82–1.77)

## 2022-05-24 LAB — THYROGLOBULIN ANTIBODY: Thyroglobulin Antibody: <1 IU/mL (ref 0.0–0.9)

## 2022-05-24 LAB — THYROGLOBULIN LEVEL: Thyroglobulin (TG-RIA): <2 ng/mL

## 2022-06-10 ENCOUNTER — Telehealth: Payer: Self-pay

## 2022-06-10 NOTE — Telephone Encounter (Signed)
Pt called stating he has been more emotional and crying for the past several weeks. Asked if his thyroid levels could contribute.

## 2022-06-11 NOTE — Telephone Encounter (Signed)
Discussed with pt, understanding voiced. 

## 2022-06-11 NOTE — Telephone Encounter (Signed)
Left a message requesting pt return call to the office. ?

## 2022-06-27 ENCOUNTER — Other Ambulatory Visit: Payer: Self-pay | Admitting: Infectious Disease

## 2022-08-24 ENCOUNTER — Telehealth: Payer: Self-pay

## 2022-08-24 NOTE — Telephone Encounter (Signed)
-----   Message from Truman Hayward, MD sent at 08/24/2022  1:09 PM EDT ----- Is someone besides me following his thyroid function? ----- Message ----- From: Lavone Neri Lab Results In Sent: 08/22/2022   7:38 AM EDT To: Truman Hayward, MD

## 2022-08-26 ENCOUNTER — Ambulatory Visit (INDEPENDENT_AMBULATORY_CARE_PROVIDER_SITE_OTHER): Payer: BC Managed Care – PPO | Admitting: "Endocrinology

## 2022-08-26 ENCOUNTER — Encounter: Payer: Self-pay | Admitting: "Endocrinology

## 2022-08-26 VITALS — BP 136/104 | HR 64 | Ht 70.0 in | Wt 166.2 lb

## 2022-08-26 DIAGNOSIS — C73 Malignant neoplasm of thyroid gland: Secondary | ICD-10-CM

## 2022-08-26 DIAGNOSIS — E890 Postprocedural hypothyroidism: Secondary | ICD-10-CM

## 2022-08-26 MED ORDER — LEVOTHYROXINE SODIUM 112 MCG PO TABS
112.0000 ug | ORAL_TABLET | Freq: Every day | ORAL | 1 refills | Status: DC
Start: 2022-08-26 — End: 2023-06-29

## 2022-08-26 NOTE — Progress Notes (Signed)
08/26/2022, 12:59 PM  This is a telehealth visit via telephone Subjective:    Patient ID: Kenneth Mason, male    DOB: 03/23/1976, PCP Patient, No Pcp Per   Past Medical History:  Diagnosis Date   Anal wart 11/21/2014   Anxiety    Benign rectal wall submucosal cyst s/p TEM partial proctectomy Jan2013     Depression    Flank pain 08/16/2020   Headache in front of head    q 2 weeks. No n/v, no photophobia. Responds to NSAIDs   HIV disease ([place]) 05/13/2015   HIV infection ([place])    dx'd HIV July 12; CD4 1100.  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   Rectal polyp 1999   polyp excised   Rib pain 08/16/2020   RUQ pain 08/16/2020   Screen for STD (sexually transmitted disease) 05/13/2015   Thyroid nodule 04/14/2021   Past Surgical History:  Procedure Laterality Date   BIOPSY THYROID  10/10/2021   DENTAL SURGERY  06/01/2008   Wisdom Teeth    EUS  04/09/2011   Procedure: LOWER ENDOSCOPIC ULTRASOUND (EUS);  Surgeon: Owens Loffler, MD;  Location: [facility];  Service: Endoscopy;  Laterality: N/A;   FLEXIBLE SIGMOIDOSCOPY  04/09/2011   Procedure: FLEXIBLE SIGMOIDOSCOPY;  Surgeon: Owens Loffler, MD;  Location: [facility];  Service: Endoscopy;  Laterality: N/A;   THYROIDECTOMY Bilateral 12/31/2021   Procedure: TOTAL THYROIDECTOMY;  Surgeon: Izora Gala, MD;  Location: [place];  Service: ENT;  Laterality: Bilateral;   TRANSANAL ENDOSCOPIC MICROSURGERY  06/12/2011   Procedure: TRANSANAL ENDOSCOPIC MICROSURGERY;  Surgeon: Adin Hector, MD;  Location: [facility];  Service: General;  Laterality: N/A;  partial proctectomy by TEM   Social History   Socioeconomic History   Marital status: Divorced    Spouse name:  Not on file   Number of children: 2   Years of education: 14   Highest education level: Not on file  Occupational History   Occupation: Customer Service   Tobacco Use   Smoking status: Never   Smokeless tobacco: Never  Substance and Sexual Activity   Alcohol use: Not Currently    Comment: very rarely- few times a year   Drug use: No   Sexual activity: Yes    Partners: Male    Birth control/protection: Condom    Comment: given condoms  Other Topics Concern   Not on file  Social History Narrative   HSG, 2 yrs college. Married '04 - seperated (2012). !son ' 2005; 1  dtr - '08. 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  Lives with children. Wife/ mother is abroad (aug '12).   Social Determinants of Health   Financial Resource Strain: Not on file  Food Insecurity: Not on file  Transportation Needs: Not on file  Physical Activity: Not on file  Stress: Not on file  Social Connections: Not on file   Family History  Problem Relation Age of Onset   Hypertension Mother  Cancer Maternal Grandfather        unsure of type    Liver cancer Paternal Grandmother    Stroke Paternal Grandmother    Heart disease Paternal Grandfather        CAD/MI-fatal   Outpatient Encounter Medications as of 08/26/2022  Medication Sig   atorvastatin (LIPITOR) 10 MG tablet TAKE 1 TABLET BY MOUTH EVERY DAY   emtricitabine-rilpivir-tenofovir AF (ODEFSEY) 200-25-25 MG TABS tablet TAKE ONE TABLET BY MOUTH ONCE DAILY WITH BREAKFAST.   levothyroxine (SYNTHROID) 112 MCG tablet Take 1 tablet (112 mcg total) by mouth daily.   [DISCONTINUED] levothyroxine (SYNTHROID) 100 MCG tablet Take 1 tablet (100 mcg total) by mouth daily.   No facility-administered encounter medications on file as of 08/26/2022.   ALLERGIES: No Known Allergies  VACCINATION STATUS: Immunization History  Administered Date(s) Administered   Hepatitis A  12/15/2010, 05/19/2011   Hepatitis B 12/15/2010, 05/19/2011, 06/28/2012   Influenza Split 05/19/2011, 06/28/2012   Influenza,inj,Quad PF,6+ Mos 06/20/2013, 05/06/2016, 05/03/2017, 04/14/2021   Meningococcal Mcv4o 05/03/2017, 12/07/2017   PFIZER(Purple Top)SARS-COV-2 Vaccination 08/11/2019, 09/06/2019   Pfizer Covid-19 Vaccine Bivalent Booster 33yrs & up 04/14/2021   Pneumococcal Conjugate-13 12/07/2017   Pneumococcal Polysaccharide-23 12/26/2018   Td 12/15/2010   Tdap 12/15/2010    HPI Kenneth Mason is 47 y.o. male who presents today with a medical history as above. he is being engaged over telephone after he was seen in consultation for  postsurgical hypothyroidism, history of thyroid cancer requested by Dr. [facility], MD . -See notes from previous visit. Review of his medical records indicated that he underwent total thyroidectomy in August 2023 after workup indicated thyroid malignancy.  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  - Microscopic focus confined within left lobe.  - Margins negative for involvement.   He is currently on levothyroxine 100 mcg p.o. daily before breakfast with reasonable consistency and compliance.  His pre-visit TFTs are c/w under-replacement.    He denies dysphagia, shortness of breath, nor voice change.  He has  recovered  from the surgery very well. Patient did have bilateral thyroid nodules on presurgical ultrasound.  He denies exposure to neck radiation, no family history of thyroid dysfunction or thyroid malignancy.  He was sent for new set of labs after his last visit.  He has no new complaints today.  Review of Systems   Objective:       08/26/2022   10:07 AM 05/22/2022    9:54 AM 05/15/2022    8:37 AM  Vitals with BMI  Height 5\' 10"  5\' 10"  5\' 10"   Weight 166 lbs 3 oz 170 lbs 168 lbs  BMI 23.85 [id number]   Systolic [id number]  [id number]  Diastolic 123456  86  Pulse 64  60    BP (!) 136/104 Comment: 132/88 R arm with manuel cuff  Pulse 64   Ht 5\' 10"  (1.778 m)   Wt 166 lb 3.2 oz (75.4 kg)   BMI 23.85 kg/m   Wt Readings from Last 3 Encounters:  08/26/22 166 lb 3.2 oz (75.4 kg)  05/22/22 170 lb (77.1 kg)  05/15/22 168 lb (76.2 kg)    Physical Exam  Constitutional:  Body mass index is 23.85 kg/m.,  not in acute distress, normal state of mind Eyes: PERRLA, EOMI, no exophthalmos ENT: moist mucous membranes, post thyroidectomy surgical scar on anterior lower neck,  no gross  cervical lymphadenopathy   CMP ( most recent) CMP     Component Value Date/Time   NA 141 05/15/2022 0917   K 4.1 05/15/2022 0917   CL 102 05/15/2022 0917   CO2 28 05/15/2022 0917   GLUCOSE 86 05/15/2022 0917   GLUCOSE 74 04/15/2022 1519   BUN 14 05/15/2022 0917   CREATININE 1.30 (H) 05/15/2022 0917   CREATININE 1.31 (H) 04/15/2022 1519   CALCIUM 9.3 05/15/2022 0917   PROT 6.8 05/15/2022 0917   ALBUMIN 4.3 05/15/2022 0917   AST 20 05/15/2022 0917   ALT 17 05/15/2022 0917   ALKPHOS 93 05/15/2022 0917   BILITOT 0.4 05/15/2022 0917   GFRNONAA 67 07/11/2020 1201   GFRAA 78 07/11/2020 1201     Diabetic Labs (most recent): Lab Results  Component Value Date   MICROALBUR 0.50 12/12/2013     Lipid Panel ( most recent) Lipid Panel     Component Value Date/Time   CHOL 163 04/15/2022 1519   TRIG 144 04/15/2022 1519   HDL 43 04/15/2022 1519   CHOLHDL 3.8 04/15/2022 1519   VLDL 10 11/02/2016 1203   LDLCALC 96 04/15/2022 1519      Latest Reference Range & Units 05/15/22 09:17  TSH 0.450 - 4.500 uIU/mL 2.550  T4,Free(Direct) 0.82 - 1.77 ng/dL 1.74  Thyroglobulin Antibody 0.0 - 0.9 IU/mL <1.0  THYROGLOBULIN (TG-RIA)  WILL FOLLOW (P)  (P): Preliminary   Assessment & Plan:   1. Postsurgical hypothyroidism 2. Malignant neoplasm of thyroid gland [facility]) -Patient was recently seen as a consult status post  thyroidectomy.  he has  had 0.5 cm unilateral, unifocal follicular variant papillary thyroid cancer status post total thyroidectomy and currently with postsurgical hypothyroidism.  I had a long discussion with him about approaches of therapy for thyroid cancer including surgery, radioactive iodine thyroid remnant ablation, and subsequent follow-up.  Considering his microscopic, unifocal, unilateral disease, he is low risk for tumor recurrence and hence may not need radioactive iodine treatment at this time.  His previsit labs are consistent with under-replacement.  I discussed and increased his  levothyroxine to 112 mcg p.o. daily before breakfast.   - We discussed about the correct intake of his thyroid hormone, on empty stomach at fasting, with water, separated by at least 30 minutes from breakfast and other medications,  and separated by more than 4 hours from calcium, iron, multivitamins, acid reflux medications (PPIs). -Patient is made aware of the fact that thyroid hormone replacement is needed for life, dose to be adjusted by periodic monitoring of thyroid function tests.   - he is advised to maintain close follow up with his PCP for primary care needs.     I spent  22 minutes in the care of the patient today including review of labs from Thyroid Function, CMP, and other relevant labs ; imaging/biopsy records (current and previous including abstractions from other facilities); face-to-face time discussing  his lab results and symptoms, medications doses, his options of short and long term treatment based on the latest standards of care / guidelines;   and documenting the encounter.  Alphonse Guild  participated in the discussions, expressed understanding, and voiced agreement with the above plans.  All questions were answered to his satisfaction. he is encouraged to contact clinic should he have any questions or concerns prior to his return visit.    Follow up plan: Return in about 6  months (around 02/26/2023) for F/U with Pre-visit Labs, Thyroid / Neck Ultrasound.   Heriberto Antigua  Dorris Fetch, MD [facility] [address] [place], [place] [address] Phone: [phone]  Fax: [phone]     08/26/2022, 12:59 PM  This note was partially dictated with voice recognition software. Similar sounding words can be transcribed inadequately or may not  be corrected upon review.

## 2022-08-30 LAB — THYROGLOBULIN LEVEL: Thyroglobulin (TG-RIA): <2 ng/mL

## 2022-08-30 LAB — TSH: TSH: 5.23 u[IU]/mL — ABNORMAL HIGH (ref 0.450–4.500)

## 2022-08-30 LAB — T4, FREE: Free T4: 1.37 ng/dL (ref 0.82–1.77)

## 2022-10-23 ENCOUNTER — Telehealth: Payer: Self-pay | Admitting: "Endocrinology

## 2022-10-23 DIAGNOSIS — C73 Malignant neoplasm of thyroid gland: Secondary | ICD-10-CM

## 2022-10-23 DIAGNOSIS — E890 Postprocedural hypothyroidism: Secondary | ICD-10-CM

## 2022-10-23 NOTE — Telephone Encounter (Signed)
Order for thyroid ultrasound resent.

## 2022-10-23 NOTE — Telephone Encounter (Signed)
Pt stated no one has called from ultra sound.  Can we send that order back in?

## 2022-11-26 ENCOUNTER — Encounter: Payer: Self-pay | Admitting: Infectious Disease

## 2022-11-26 ENCOUNTER — Telehealth: Payer: Self-pay | Admitting: "Endocrinology

## 2022-11-26 NOTE — Telephone Encounter (Signed)
Pt left a VM stating that nobody has called her yet to schedule her ultrasound

## 2022-11-26 NOTE — Telephone Encounter (Signed)
Spoke with pt, advised him the ultrasound order notates request to schedule in August and should here from scheduling towards the end of July. Pt voiced understanding.

## 2022-12-02 ENCOUNTER — Ambulatory Visit (INDEPENDENT_AMBULATORY_CARE_PROVIDER_SITE_OTHER): Payer: BC Managed Care – PPO

## 2022-12-02 ENCOUNTER — Ambulatory Visit: Payer: BC Managed Care – PPO

## 2022-12-02 ENCOUNTER — Other Ambulatory Visit: Payer: Self-pay

## 2022-12-02 DIAGNOSIS — Z23 Encounter for immunization: Secondary | ICD-10-CM | POA: Diagnosis not present

## 2022-12-21 ENCOUNTER — Ambulatory Visit
Admission: RE | Admit: 2022-12-21 | Discharge: 2022-12-21 | Disposition: A | Discharge status: Home / Self Care | Payer: BC Managed Care – PPO | Source: Ambulatory Visit | Attending: "Endocrinology | Admitting: "Endocrinology

## 2022-12-21 DIAGNOSIS — C73 Malignant neoplasm of thyroid gland: Secondary | ICD-10-CM

## 2023-01-01 ENCOUNTER — Ambulatory Visit (INDEPENDENT_AMBULATORY_CARE_PROVIDER_SITE_OTHER): Payer: BC Managed Care – PPO

## 2023-01-01 ENCOUNTER — Other Ambulatory Visit: Payer: Self-pay

## 2023-01-01 DIAGNOSIS — Z23 Encounter for immunization: Secondary | ICD-10-CM | POA: Diagnosis not present

## 2023-01-04 ENCOUNTER — Other Ambulatory Visit: Payer: Self-pay

## 2023-01-04 DIAGNOSIS — Z113 Encounter for screening for infections with a predominantly sexual mode of transmission: Secondary | ICD-10-CM

## 2023-01-04 DIAGNOSIS — B20 Human immunodeficiency virus [HIV] disease: Secondary | ICD-10-CM

## 2023-01-05 ENCOUNTER — Other Ambulatory Visit (HOSPITAL_COMMUNITY)
Admission: RE | Admit: 2023-01-05 | Discharge: 2023-01-05 | Disposition: A | Discharge status: Home / Self Care | Payer: BC Managed Care – PPO | Source: Ambulatory Visit | Attending: Infectious Disease | Admitting: Infectious Disease

## 2023-01-05 ENCOUNTER — Other Ambulatory Visit: Payer: Self-pay

## 2023-01-05 ENCOUNTER — Other Ambulatory Visit: Payer: BC Managed Care – PPO

## 2023-01-05 DIAGNOSIS — Z113 Encounter for screening for infections with a predominantly sexual mode of transmission: Secondary | ICD-10-CM | POA: Diagnosis present

## 2023-01-05 DIAGNOSIS — B20 Human immunodeficiency virus [HIV] disease: Secondary | ICD-10-CM

## 2023-01-05 LAB — CBC WITH DIFFERENTIAL/PLATELET
Absolute Monocytes: 448 cells/uL (ref 200–950)
Basophils Absolute: 22 cells/uL (ref 0–200)
Basophils Relative: 0.4 %
Eosinophils Absolute: 78 cells/uL (ref 15–500)
Eosinophils Relative: 1.4 %
HCT: 42.9 % (ref 38.5–50.0)
Hemoglobin: 14.8 g/dL (ref 13.2–17.1)
Lymphs Abs: 2750 cells/uL (ref 850–3900)
MCH: 29.6 pg (ref 27.0–33.0)
MCHC: 34.5 g/dL (ref 32.0–36.0)
MCV: 85.8 fL (ref 80.0–100.0)
MPV: 9.5 fL (ref 7.5–12.5)
Monocytes Relative: 8 %
Neutro Abs: 2302 cells/uL (ref 1500–7800)
Neutrophils Relative %: 41.1 %
Platelets: 218 10*3/uL (ref 140–400)
RBC: 5 10*6/uL (ref 4.20–5.80)
RDW: 14.4 % (ref 11.0–15.0)
Total Lymphocyte: 49.1 %
WBC: 5.6 10*3/uL (ref 3.8–10.8)

## 2023-01-19 ENCOUNTER — Ambulatory Visit (INDEPENDENT_AMBULATORY_CARE_PROVIDER_SITE_OTHER): Payer: BC Managed Care – PPO | Admitting: Infectious Disease

## 2023-01-19 ENCOUNTER — Encounter: Payer: Self-pay | Admitting: Infectious Disease

## 2023-01-19 ENCOUNTER — Other Ambulatory Visit: Payer: Self-pay

## 2023-01-19 VITALS — BP 134/89 | HR 59 | Temp 97.8°F | Wt 172.0 lb

## 2023-01-19 DIAGNOSIS — R041 Hemorrhage from throat: Secondary | ICD-10-CM

## 2023-01-19 DIAGNOSIS — B20 Human immunodeficiency virus [HIV] disease: Secondary | ICD-10-CM | POA: Diagnosis not present

## 2023-01-19 DIAGNOSIS — R7989 Other specified abnormal findings of blood chemistry: Secondary | ICD-10-CM

## 2023-01-19 DIAGNOSIS — C73 Malignant neoplasm of thyroid gland: Secondary | ICD-10-CM

## 2023-01-19 DIAGNOSIS — E785 Hyperlipidemia, unspecified: Secondary | ICD-10-CM | POA: Diagnosis not present

## 2023-01-19 MED ORDER — ODEFSEY 200-25-25 MG PO TABS
ORAL_TABLET | ORAL | 11 refills | Status: AC
Start: 2023-01-19 — End: ?

## 2023-01-19 NOTE — Progress Notes (Signed)
Chief complaint: Hoarse voice that occurred after singing in is likely due to trauma to his vocal cord from recent surgery for his thyroid cancer Subjective:    Patient ID: Kenneth Mason, male    DOB: 1975-08-04, 47 y.o.   MRN: [id number]  HPI  47 year old man with HIV, started on complera started in January 2014 with perfect virological suppresion and healthy CD4, now on ODEFSEY. .  When I last saw him he had a nodule in his thyroid which I referred him for biopsy this turned out to be thyroid cancer and he had a thyroidectomy.    He is now on levothyroxine.  He began singing earlier than the surgeon had recommended and now he has hoarse voice due to some bleeding around his vocal cords.  He is going through therapy and voice coaching and avoiding singing and other strenuous activity on his vocal cords.  He was concerned that his serum creatinine was slightly elevated though it is been within this range and he does not appear to have any obvious nephrotoxic condition or medication on board..             Past Medical History:  Diagnosis Date   Anal wart 11/21/2014   Anxiety    Benign rectal wall submucosal cyst s/p TEM partial proctectomy Jan2013     Depression    Flank pain 08/16/2020   Headache in front of head    q 2 weeks. No n/v, no photophobia. Responds to NSAIDs   HIV disease ([facility]) 05/13/2015   HIV infection ([facility])    dx'd HIV July 12; CD4 1100.  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   Rectal polyp 1999   polyp excised   Rib pain 08/16/2020   RUQ pain 08/16/2020   Screen for STD (sexually transmitted disease) 05/13/2015   Thyroid nodule 04/14/2021    Past Surgical History:  Procedure Laterality Date   BIOPSY THYROID  10/10/2021    DENTAL SURGERY  06/01/2008   Wisdom Teeth    EUS  04/09/2011   Procedure: LOWER ENDOSCOPIC ULTRASOUND (EUS);  Surgeon: Rob Bunting, MD;  [facility];  Service: Endoscopy;  Laterality: N/A;   FLEXIBLE SIGMOIDOSCOPY  04/09/2011   Procedure: FLEXIBLE SIGMOIDOSCOPY;  Surgeon: Rob Bunting, MD;  Location: [facility];  Service: Endoscopy;  Laterality: N/A;   THYROIDECTOMY Bilateral 12/31/2021   Procedure: TOTAL THYROIDECTOMY;  Surgeon: Serena Colonel, MD;  Location: [facility];  Service: ENT;  Laterality: Bilateral;   TRANSANAL ENDOSCOPIC MICROSURGERY  06/12/2011   Procedure: TRANSANAL ENDOSCOPIC MICROSURGERY;  Surgeon: Ardeth Sportsman, MD;  Location: [facility];  Service: General;  Laterality: N/A;  partial proctectomy by TEM    Family History  Problem Relation Age of Onset   Hypertension Mother    Cancer Maternal Grandfather        unsure of type    Liver cancer Paternal Grandmother    Stroke Paternal Grandmother    Heart disease Paternal Grandfather        CAD/MI-fatal      Social History   Socioeconomic History   Marital status: Divorced    Spouse name: Not on file   Number of children: 2   Years of education: 14   Highest education level: Not on file  Occupational History   Occupation: Financial trader  Service   Tobacco Use   Smoking status: Never   Smokeless tobacco: Never  Substance and Sexual Activity   Alcohol use: Not Currently    Comment: very rarely- few times a year   Drug use: No   Sexual activity: Yes    Partners: Male    Birth control/protection: Condom    Comment: given condoms  Other Topics Concern   Not on file  Social History Narrative   HSG, 2 yrs college. Married '04 - seperated (2012). !son ' 2005; 1  dtr - '08. 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  Lives with children. Wife/ mother is abroad (aug '12).   Social Determinants of Health   Financial Resource Strain: Not  on file  Food Insecurity: Not on file  Transportation Needs: Not on file  Physical Activity: Not on file  Stress: Not on file  Social Connections: Not on file    No Known Allergies   Current Outpatient Medications:    emtricitabine-rilpivir-tenofovir AF (ODEFSEY) 200-25-25 MG TABS tablet, TAKE ONE TABLET BY MOUTH ONCE DAILY WITH BREAKFAST., Disp: 30 tablet, Rfl: 11   levothyroxine (SYNTHROID) 112 MCG tablet, Take 1 tablet (112 mcg total) by mouth daily., Disp: 90 tablet, Rfl: 1   atorvastatin (LIPITOR) 10 MG tablet, TAKE 1 TABLET BY MOUTH EVERY DAY (Patient not taking: Reported on 01/19/2023), Disp: 90 tablet, Rfl: 1   Review of Systems  Constitutional:  Negative for activity change, appetite change, chills, diaphoresis, fatigue, fever and unexpected weight change.  HENT:  Negative for congestion, rhinorrhea, sinus pressure, sneezing, sore throat and trouble swallowing.   Eyes:  Negative for photophobia and visual disturbance.  Respiratory:  Negative for cough, chest tightness, shortness of breath, wheezing and stridor.   Cardiovascular:  Negative for chest pain, palpitations and leg swelling.  Gastrointestinal:  Negative for abdominal distention, abdominal pain, anal bleeding, blood in stool, constipation, diarrhea, nausea and vomiting.  Genitourinary:  Negative for difficulty urinating, dysuria, flank pain and hematuria.  Musculoskeletal:  Negative for arthralgias, back pain, gait problem, joint swelling and myalgias.  Skin:  Negative for color change, pallor, rash and wound.  Neurological:  Negative for dizziness, tremors, weakness and light-headedness.  Hematological:  Negative for adenopathy. Does not bruise/bleed easily.  Psychiatric/Behavioral:  Negative for agitation, behavioral problems, confusion, decreased concentration, dysphoric mood and sleep disturbance.        Objective:   Physical Exam Constitutional:      Appearance: He is well-developed.  HENT:     Head:  Normocephalic and atraumatic.  Eyes:     Conjunctiva/sclera: Conjunctivae normal.  Cardiovascular:     Rate and Rhythm: Normal rate and regular rhythm.  Pulmonary:     Effort: Pulmonary effort is normal. No respiratory distress.     Breath sounds: No wheezing.  Abdominal:     General: There is no distension.     Palpations: Abdomen is soft.  Musculoskeletal:        General: No tenderness. Normal range of motion.     Cervical back: Normal range of motion and neck supple.  Skin:    General: Skin is warm and dry.     Coloration: Skin is not pale.     Findings: No erythema or rash.  Neurological:     General: No focal deficit present.     Mental Status: He is alert and oriented to person, place, and time.  Psychiatric:  Mood and Affect: Mood normal.        Behavior: Behavior normal.        Thought Content: Thought content normal.        Judgment: Judgment normal.         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    Slightly elevated creatinine: This is within the range where he has been and I am not concerned for nephrotoxicity based on his clinical condition and labs

## 2023-01-28 ENCOUNTER — Other Ambulatory Visit: Payer: Self-pay | Admitting: Medical Genetics

## 2023-01-28 DIAGNOSIS — Z006 Encounter for examination for normal comparison and control in clinical research program: Secondary | ICD-10-CM

## 2023-02-18 ENCOUNTER — Ambulatory Visit: Payer: BC Managed Care – PPO | Admitting: "Endocrinology

## 2023-02-19 LAB — TSH: TSH: 3.27 u[IU]/mL (ref 0.450–4.500)

## 2023-02-19 LAB — THYROGLOBULIN ANTIBODY: Thyroglobulin Antibody: <1 IU/mL (ref 0.0–0.9)

## 2023-02-19 LAB — THYROGLOBULIN LEVEL

## 2023-02-19 LAB — T4, FREE: Free T4: 1.48 ng/dL (ref 0.82–1.77)

## 2023-02-25 ENCOUNTER — Ambulatory Visit: Payer: BC Managed Care – PPO | Admitting: "Endocrinology

## 2023-02-26 ENCOUNTER — Ambulatory Visit: Payer: BC Managed Care – PPO | Admitting: "Endocrinology

## 2023-03-04 ENCOUNTER — Encounter: Payer: Self-pay | Admitting: Infectious Disease

## 2023-03-04 ENCOUNTER — Other Ambulatory Visit: Payer: Self-pay

## 2023-03-04 ENCOUNTER — Ambulatory Visit: Payer: BC Managed Care – PPO | Admitting: Infectious Disease

## 2023-03-04 ENCOUNTER — Other Ambulatory Visit: Payer: Self-pay | Admitting: Infectious Disease

## 2023-03-04 ENCOUNTER — Telehealth: Payer: Self-pay

## 2023-03-04 VITALS — BP 123/85 | HR 75 | Temp 97.9°F | Wt 169.0 lb

## 2023-03-04 DIAGNOSIS — B20 Human immunodeficiency virus [HIV] disease: Secondary | ICD-10-CM | POA: Diagnosis not present

## 2023-03-04 DIAGNOSIS — R041 Hemorrhage from throat: Secondary | ICD-10-CM | POA: Insufficient documentation

## 2023-03-04 DIAGNOSIS — R85610 Atypical squamous cells of undetermined significance on cytologic smear of anus (ASC-US): Secondary | ICD-10-CM

## 2023-03-04 DIAGNOSIS — R030 Elevated blood-pressure reading, without diagnosis of hypertension: Secondary | ICD-10-CM

## 2023-03-04 DIAGNOSIS — Z7185 Encounter for immunization safety counseling: Secondary | ICD-10-CM | POA: Insufficient documentation

## 2023-03-04 DIAGNOSIS — Z113 Encounter for screening for infections with a predominantly sexual mode of transmission: Secondary | ICD-10-CM

## 2023-03-04 DIAGNOSIS — A630 Anogenital (venereal) warts: Secondary | ICD-10-CM

## 2023-03-04 DIAGNOSIS — R490 Dysphonia: Secondary | ICD-10-CM

## 2023-03-04 DIAGNOSIS — N5089 Other specified disorders of the male genital organs: Secondary | ICD-10-CM

## 2023-03-04 DIAGNOSIS — C73 Malignant neoplasm of thyroid gland: Secondary | ICD-10-CM

## 2023-03-04 DIAGNOSIS — Z21 Asymptomatic human immunodeficiency virus [HIV] infection status: Secondary | ICD-10-CM

## 2023-03-04 HISTORY — DX: Elevated blood-pressure reading, without diagnosis of hypertension: R03.0

## 2023-03-04 HISTORY — DX: Encounter for immunization safety counseling: Z71.85

## 2023-03-04 HISTORY — DX: Hemorrhage from throat: R04.1

## 2023-03-04 HISTORY — DX: Other specified disorders of the male genital organs: N50.89

## 2023-03-04 MED ORDER — ATORVASTATIN CALCIUM 10 MG PO TABS: 10.0000 mg | ORAL_TABLET | Freq: Every day | ORAL | 1 refills | Status: DC

## 2023-03-04 MED ORDER — ODEFSEY 200-25-25 MG PO TABS
ORAL_TABLET | ORAL | 11 refills | Status: DC
Start: 2023-03-04 — End: 2024-01-24

## 2023-03-04 NOTE — Progress Notes (Signed)
Chief complaint:  lump on testicle Subjective:    Patient ID: Kenneth Mason, male    DOB: [birth year], 47 y.o.   MRN: [id number]  HPI     Discussed the use of AI scribe software for clinical note transcription with the patient, who gave verbal consent to proceed.  History of Present Illness   Kenneth Mason, a patient with a history of thyroid cancer, HIV, and abnormal Pap smears, presents with a lump on his testicle that has been present for about a month. He reports that the lump is on the left side, different from a previous cyst that was identified on the right side. The lump is described as tough and about the size of a pea. He has not noticed any significant changes in the size of the lump since he first noticed it. He also mentions that he feels the lump seems bigger when he is in the shower.  In addition to the lump, Kenneth Mason has concerns about his blood pressure. He reports that it was found to be elevated during a recent visit. He does not currently have a blood pressure monitor at home but is willing to get one to monitor his blood pressure.  Kenneth Mason also mentions that he has been on [facility] for HIV treatment for about ten years and is satisfied with it. He has had abnormal Pap smears in the past and is being followed up for this. He also mentions that he has had a flu shot recently, but is unsure if he has had a COVID-19 vaccine.             Past Medical History:  Diagnosis Date   Anal wart 11/21/2014   Anxiety    Benign rectal wall submucosal cyst s/p TEM partial proctectomy Jan2013     Depression    Flank pain 08/16/2020   Headache in front of head    q 2 weeks. No n/v, no photophobia. Responds to NSAIDs   HIV disease (HCC) 05/13/2015   HIV infection (HCC)    dx'd HIV July 12; CD4 1100.  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   Rectal polyp 1999   polyp excised   Rib pain 08/16/2020   RUQ pain 08/16/2020   Screen for STD (sexually transmitted disease) 05/13/2015   Thyroid nodule 04/14/2021   Vaccine counseling 03/04/2023    Past Surgical History:  Procedure Laterality Date   BIOPSY THYROID  10/10/2021   DENTAL SURGERY  06/01/2008   Wisdom Teeth    EUS  04/09/2011   Procedure: LOWER ENDOSCOPIC ULTRASOUND (EUS);  Surgeon: Rob Bunting, MD;  Location: [facility];  Service: Endoscopy;  Laterality: N/A;   FLEXIBLE SIGMOIDOSCOPY  04/09/2011   Procedure: FLEXIBLE SIGMOIDOSCOPY;  Surgeon: Rob Bunting, MD;  Location: [facility];  Service: Endoscopy;  Laterality: N/A;   THYROIDECTOMY Bilateral 12/31/2021   Procedure: TOTAL THYROIDECTOMY;  Surgeon: Serena Colonel, MD;  Location: [facility] OR;  Service: ENT;  Laterality: Bilateral;   TRANSANAL ENDOSCOPIC MICROSURGERY  06/12/2011   Procedure: TRANSANAL ENDOSCOPIC MICROSURGERY;  Surgeon: Ardeth Sportsman, MD;  Location: [facility];  Service: General;  Laterality: N/A;  partial proctectomy by TEM    Family History  Problem Relation Age of Onset   Hypertension Mother    Cancer Maternal Grandfather        unsure  of type    Liver cancer Paternal Grandmother    Stroke Paternal Grandmother    Heart disease Paternal Grandfather        CAD/MI-fatal      Social History   Socioeconomic History   Marital status: Divorced    Spouse name: Not on file   Number of children: 2   Years of education: 14   Highest education level: Not on file  Occupational History   Occupation: Clinical biochemist   Tobacco Use   Smoking status: Never   Smokeless tobacco: Never  Substance and Sexual Activity   Alcohol use: Not Currently    Comment: very rarely- few times a year   Drug use: No   Sexual activity: Yes    Partners: Male    Birth control/protection: Condom    Comment: given condoms  Other Topics Concern   Not  on file  Social History Narrative   HSG, 2 yrs college. Married '04 - seperated (2012). !son ' 2005; 1  dtr - '08. 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  Lives with children. Wife/ mother is abroad (aug '12).   Social Determinants of Health   Financial Resource Strain: Not on file  Food Insecurity: Not on file  Transportation Needs: Not on file  Physical Activity: Not on file  Stress: Not on file  Social Connections: Not on file    No Known Allergies   Current Outpatient Medications:    atorvastatin (LIPITOR) 10 MG tablet, TAKE 1 TABLET BY MOUTH EVERY DAY (Patient not taking: Reported on 01/19/2023), Disp: 90 tablet, Rfl: 1   emtricitabine-rilpivir-tenofovir AF (ODEFSEY) 200-25-25 MG TABS tablet, TAKE ONE TABLET BY MOUTH ONCE DAILY WITH BREAKFAST., Disp: 30 tablet, Rfl: 11   levothyroxine (SYNTHROID) 112 MCG tablet, Take 1 tablet (112 mcg total) by mouth daily., Disp: 90 tablet, Rfl: 1   Review of Systems  Constitutional:  Negative for activity change, appetite change, chills, diaphoresis, fatigue, fever and unexpected weight change.  HENT:  Negative for congestion, rhinorrhea, sinus pressure, sneezing, sore throat and trouble swallowing.   Eyes:  Negative for photophobia and visual disturbance.  Respiratory:  Negative for cough, chest tightness, shortness of breath, wheezing and stridor.   Cardiovascular:  Negative for chest pain, palpitations and leg swelling.  Gastrointestinal:  Negative for abdominal distention, abdominal pain, anal bleeding, blood in stool, constipation, diarrhea, nausea and vomiting.  Genitourinary:  Positive for scrotal swelling. Negative for difficulty urinating, dysuria, flank pain and hematuria.  Musculoskeletal:  Negative for arthralgias, back pain, gait problem, joint swelling and myalgias.  Skin:  Negative for color change, pallor, rash and wound.  Neurological:   Negative for dizziness, tremors, weakness and light-headedness.  Hematological:  Negative for adenopathy. Does not bruise/bleed easily.  Psychiatric/Behavioral:  Negative for agitation, behavioral problems, confusion, decreased concentration, dysphoric mood and sleep disturbance.        Objective:   Physical Exam Exam conducted with a chaperone present.  Constitutional:      Appearance: He is well-developed.  HENT:     Head: Normocephalic and atraumatic.  Eyes:     Conjunctiva/sclera: Conjunctivae normal.  Cardiovascular:     Rate and Rhythm: Normal rate and regular rhythm.  Pulmonary:     Effort: Pulmonary effort is normal. No respiratory distress.     Breath sounds: No wheezing.  Abdominal:     General: There is no distension.     Palpations:  Abdomen is soft.  Genitourinary:    Penis: Normal.      Testes:        Left: Mass present. Tenderness or swelling not present. Left testis is descended.  Musculoskeletal:        General: No tenderness. Normal range of motion.     Cervical back: Normal range of motion and neck supple.  Skin:    General: Skin is warm and dry.     Coloration: Skin is not pale.     Findings: No erythema or rash.  Neurological:     Mental Status: He is alert and oriented to person, place, and time.  Psychiatric:        Mood and Affect: Mood normal.        Behavior: Behavior normal.        Thought Content: Thought content normal.        Judgment: Judgment normal.         Assessment & Plan:   Assessment and Plan    Testicular Mass New left-sided testicular mass noted for one month, previously had a right-sided cyst. Mass is firm and superficial. -Order testicular ultrasound to evaluate the mass.  Hypertension Elevated blood pressure noted during the visit, no current antihypertensive treatment. -Advise patient to purchase a home blood pressure monitor and record readings. -Recheck blood pressure in two months.  HIV Viral load undetectable,  CD4 count healthy. Patient is compliant with Odefsey. -Continue Odefsey as prescribed.   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 -Resend prescription for statin medication.  Anal HPV Abnormal anal Pap smear in the past, patient is under follow-up with a specialist. -Continue follow-up with specialist as advised.  General Health Maintenance -Administer COVID-19 vaccine today. -Check if patient has received the flu vaccine recently, if not, administer today. -Consider tetanus vaccine if not updated in the last 10 years. -Follow-up in two months to check blood pressure and discuss ultrasound results.

## 2023-03-04 NOTE — Telephone Encounter (Signed)
Patient called office today requesting appointment for testicular lump. Noticed lump a month ago. Denies any pain/ discomfort. States that he had this concern before, but not sure if it could be related.  Scheduled for today at 345. Juanita Laster, RMA

## 2023-03-09 ENCOUNTER — Ambulatory Visit
Admission: RE | Admit: 2023-03-09 | Discharge: 2023-03-09 | Disposition: A | Discharge status: Home / Self Care | Payer: BC Managed Care – PPO | Source: Ambulatory Visit | Attending: Infectious Disease | Admitting: Infectious Disease

## 2023-03-09 DIAGNOSIS — N5089 Other specified disorders of the male genital organs: Secondary | ICD-10-CM

## 2023-03-10 ENCOUNTER — Telehealth: Payer: Self-pay

## 2023-03-10 NOTE — Telephone Encounter (Signed)
Patient is aware of ultrasound results. He verbalized understanding and had no further questions.

## 2023-03-10 NOTE — Telephone Encounter (Signed)
-----   Message from Paulette Blanch Dam sent at 03/10/2023  3:59 PM EDT ----- Regarding: his [place] is quite reassuring  ----- Message ----- From: Leory Plowman, Rad Results In Sent: 03/10/2023  11:24 AM EDT To: Randall Hiss, MD

## 2023-03-17 ENCOUNTER — Ambulatory Visit: Payer: BC Managed Care – PPO | Admitting: "Endocrinology

## 2023-04-07 ENCOUNTER — Other Ambulatory Visit (HOSPITAL_COMMUNITY)
Admission: RE | Admit: 2023-04-07 | Discharge: 2023-04-07 | Disposition: A | Discharge status: Home / Self Care | Payer: BC Managed Care – PPO | Source: Ambulatory Visit | Attending: Medical Genetics | Admitting: Medical Genetics

## 2023-04-07 DIAGNOSIS — Z006 Encounter for examination for normal comparison and control in clinical research program: Secondary | ICD-10-CM | POA: Insufficient documentation

## 2023-04-20 LAB — GENECONNECT MOLECULAR SCREEN

## 2023-04-20 LAB — HELIX MOLECULAR SCREEN: Genetic Analysis Overall Interpretation: NEGATIVE

## 2023-05-09 IMAGING — US US THYROID
2 series · 13 of 25 positions shown · non-contrast
Comparison: None.

CLINICAL DATA: Palpable abnormality.

EXAM:
THYROID ULTRASOUND
TECHNIQUE: Ultrasound examination of the thyroid gland and adjacent soft
tissues was performed.

[Series 1: us thyroid · 0.08mm/px · 10 of 53 slices shown (1 of 2)]
[im 1/53]
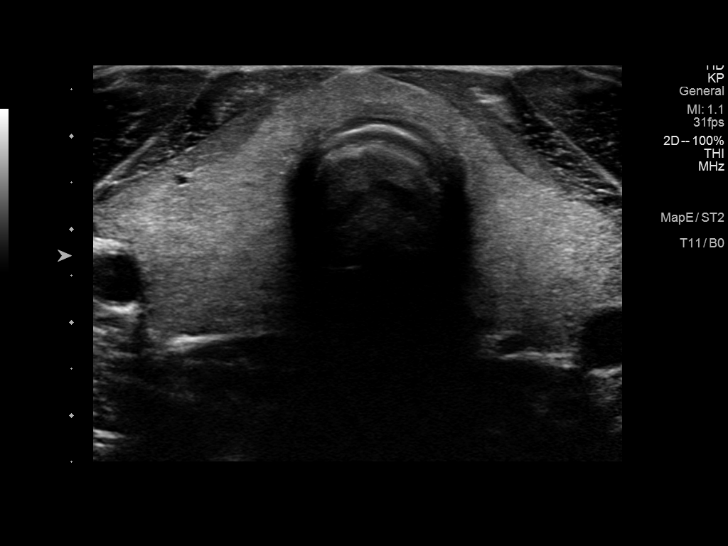
[im 6/53]
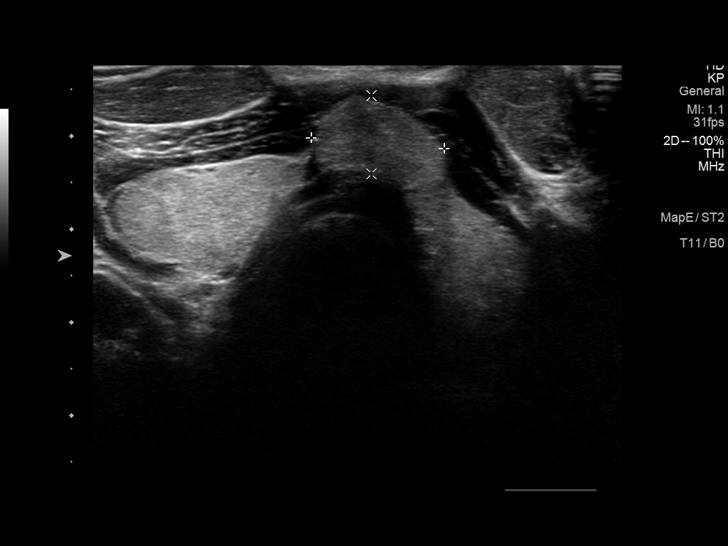
[im 12/53]
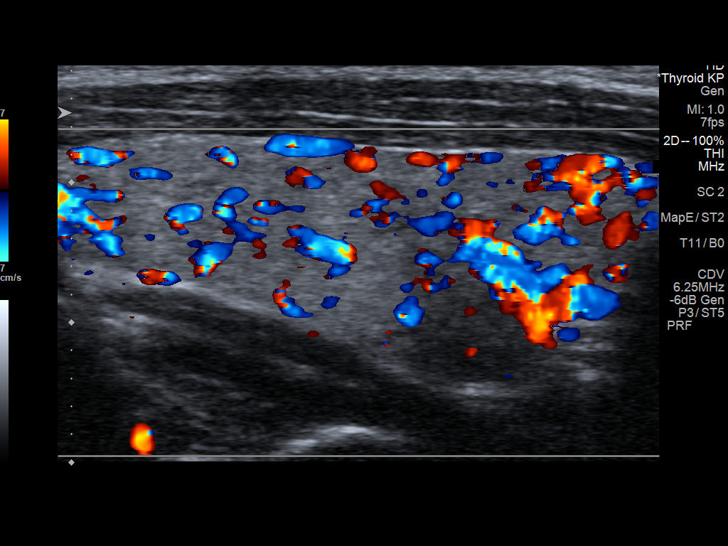
[im 18/53]
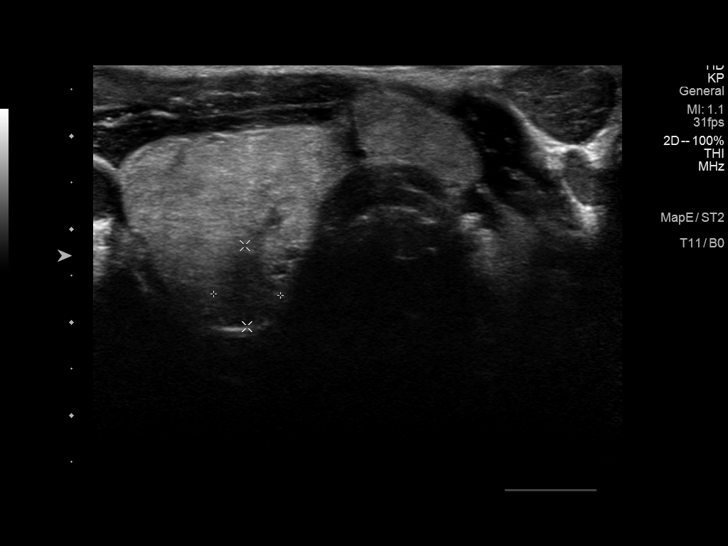
[im 24/53]
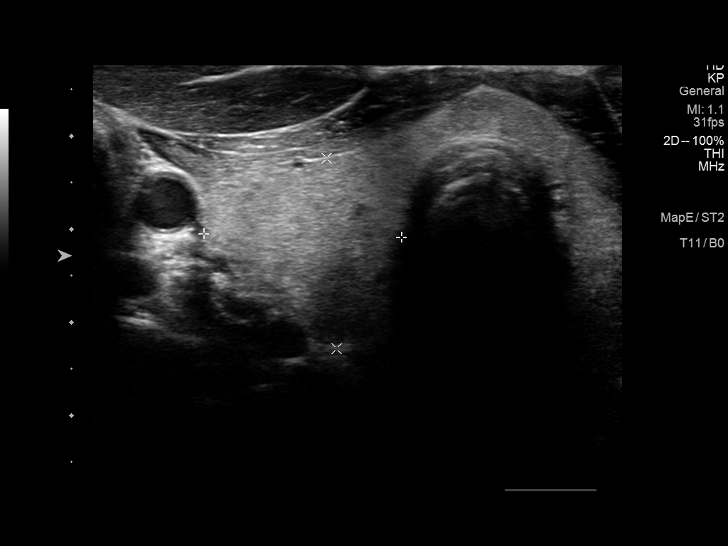
[im 29/53]
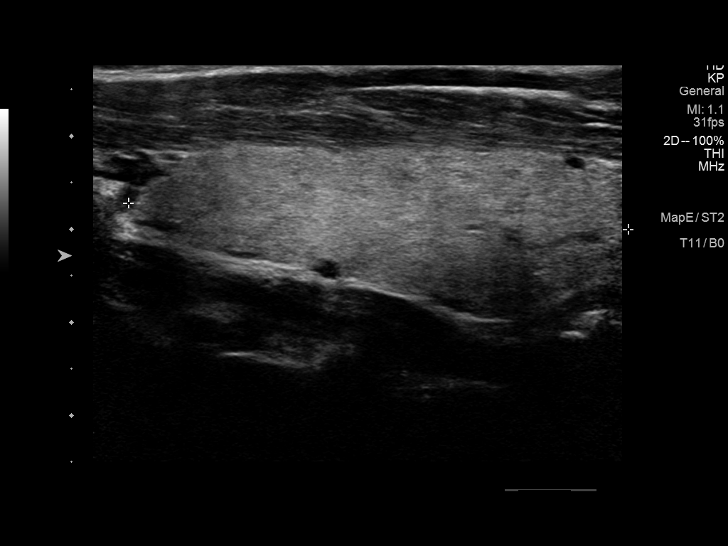
[im 35/53]
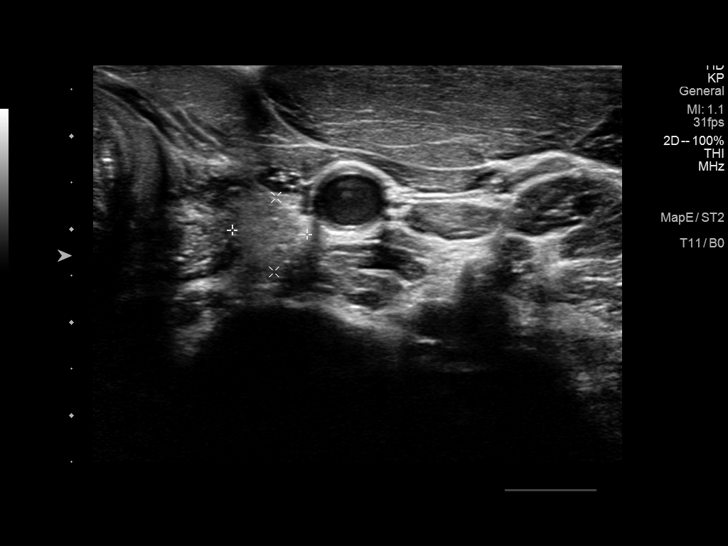
[im 41/53]
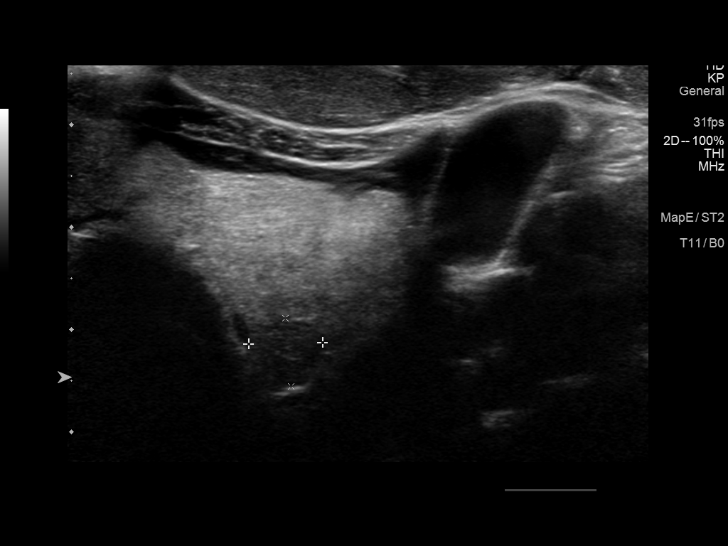
[im 47/53]
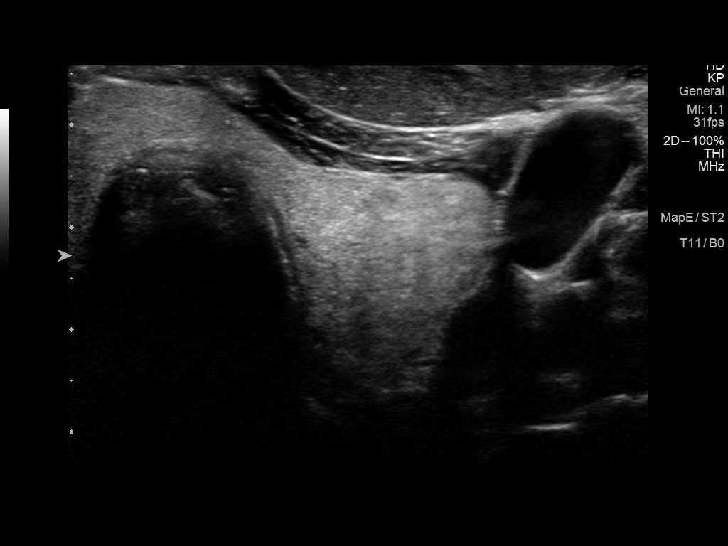
[im 53/53]
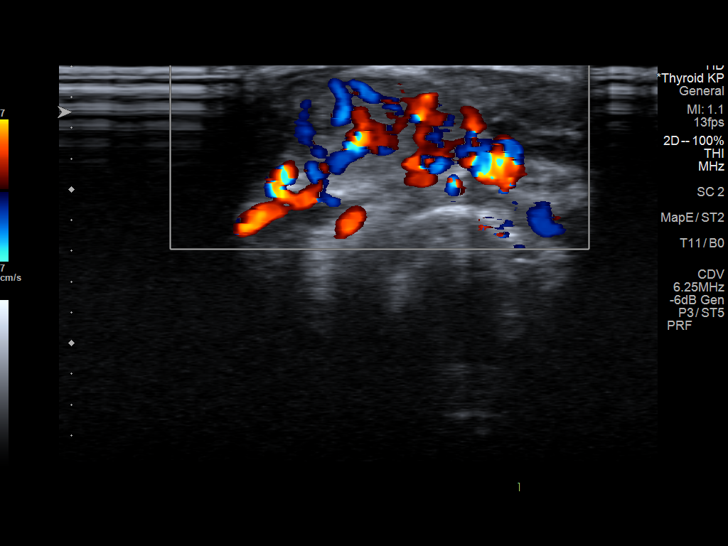

[Series 2: us thyroid · 0.08mm/px · 3 of 18 slices shown (2 of 2)]
[im 4/18]
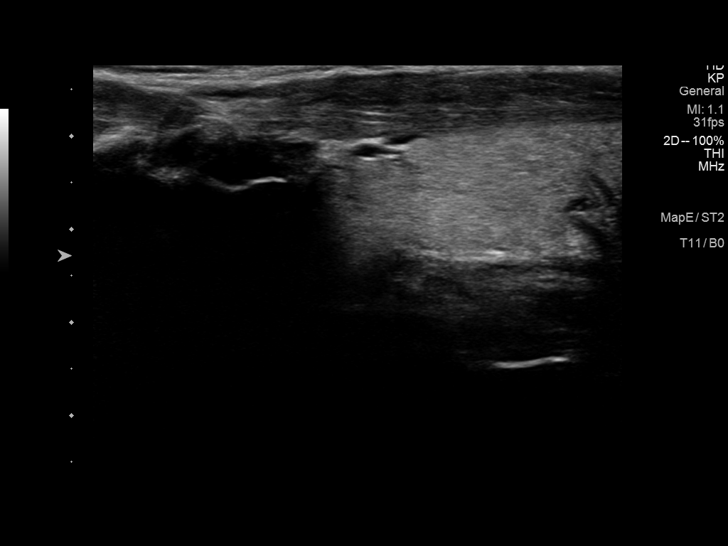
[im 11/18]
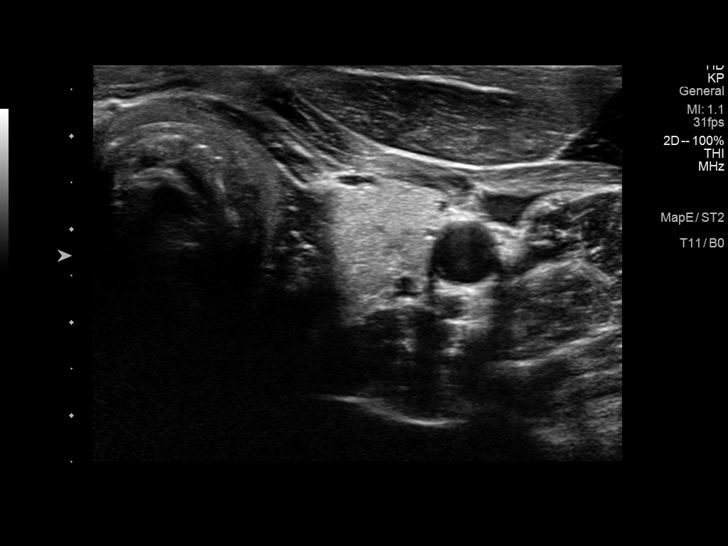
[im 18/18]
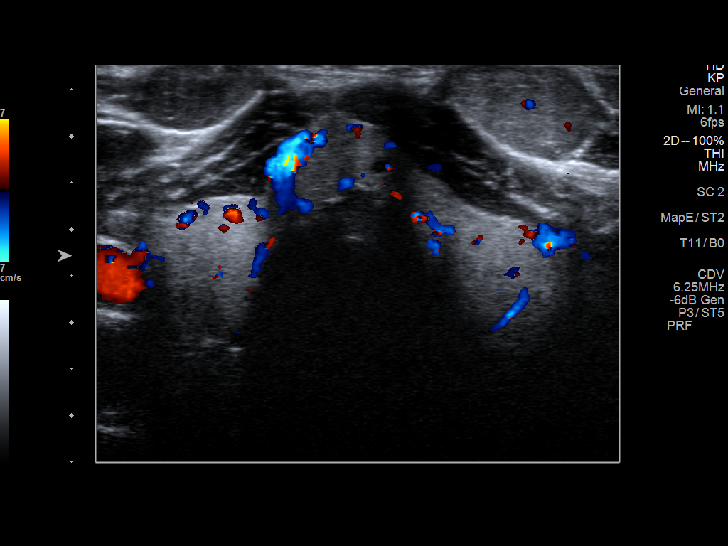

[13 of 25 positions shown; findings below may reference images not displayed]

FINDINGS: Parenchymal Echotexture: Mildly heterogenous

Isthmus: 0.8 cm

Right lobe: 5.2 x 2.1 x 2.1 cm

Left lobe: 5.4 x 1.9 x 2.4 cm

_________________________________________________________

Estimated total number of nodules >/= 1 cm: 3

Number of spongiform nodules >/=  2 cm not described below (TR1): 0

Number of mixed cystic and solid nodules >/= 1.5 cm not described
below (TR2): 0

_________________________________________________________

Nodule labeled 1 is a solid isoechoic nodule (TR 3) in the thyroid
isthmus measuring 1.8 x 1.4 x 0.8 cm. *Given size (>/= 1.5 - 2.4 cm)
and appearance, a follow-up ultrasound in 1 year should be
considered based on TI-RADS criteria.

Nodule labeled 2 appears to be a pseudonodule, describing a
heterogeneously hypoechoic region within the posterior right thyroid
lobe measuring less than 1 cm in size.

Nodule labeled 3 refers to a solid isoechoic nodule (TR 3) in the
superior left thyroid lobe measuring 0.9 cm. Given size (<1.4 cm)
and appearance, this nodule does NOT meet TI-RADS criteria for
biopsy or dedicated follow-up.

Nodule labeled 4 again appears to referred to a heterogeneously
hypoechoic area in the posterior left thyroid lobe, measuring up to
1.3 cm, pseudonodule versus nodule.

Other: In the midline neck just superior to the thyroid gland, there
is a 1.4 x 1.2 x 0.7 cm subcutaneous nodule that is isoechoic to the
adjacent musculature. It is well-circumscribed. It appears to
demonstrate internal vascularity.
IMPRESSION: 1. Multinodular thyroid gland.
2. Nodule labeled 1 in the thyroid isthmus meets criteria for
follow-up ultrasound in 1 year.
3. There are heterogeneous hypoechoic areas in the bilateral
posterior thyroid lobes, which are favored artifactual due to
sonographic technique versus poorly defined thyroid nodules. These
areas measure 1-1.3 cm. These can be followed up at the next
ultrasound in 1 year.
4. In the midline neck just superior to the thyroid gland, there is
a well-circumscribed 1.4 cm subcutaneous nodule. Although its
location favors a thyroglossal duct cyst, it is relatively echogenic
with internal vascularity, making this finding indeterminate.
Further evaluation can be performed with either CT or MRI of the
neck. Consider ENT referral.

The above is in keeping with the ACR TI-RADS recommendations - [HOSPITAL] 8948;[DATE].

## 2023-06-25 ENCOUNTER — Telehealth: Payer: Self-pay | Admitting: "Endocrinology

## 2023-06-25 ENCOUNTER — Other Ambulatory Visit: Payer: Self-pay | Admitting: "Endocrinology

## 2023-06-25 DIAGNOSIS — E890 Postprocedural hypothyroidism: Secondary | ICD-10-CM

## 2023-06-25 DIAGNOSIS — C73 Malignant neoplasm of thyroid gland: Secondary | ICD-10-CM

## 2023-06-25 NOTE — Telephone Encounter (Signed)
Does this patient need labs? He has an appt Tuesday 1/28 but he did his labs in Sept. Please Advise

## 2023-06-29 ENCOUNTER — Ambulatory Visit (INDEPENDENT_AMBULATORY_CARE_PROVIDER_SITE_OTHER): Payer: BC Managed Care – PPO | Admitting: "Endocrinology

## 2023-06-29 ENCOUNTER — Telehealth: Payer: Self-pay

## 2023-06-29 ENCOUNTER — Encounter: Payer: Self-pay | Admitting: "Endocrinology

## 2023-06-29 ENCOUNTER — Other Ambulatory Visit: Payer: Self-pay

## 2023-06-29 VITALS — BP 134/88 | HR 64 | Ht 70.0 in | Wt 172.8 lb

## 2023-06-29 DIAGNOSIS — E890 Postprocedural hypothyroidism: Secondary | ICD-10-CM

## 2023-06-29 DIAGNOSIS — C73 Malignant neoplasm of thyroid gland: Secondary | ICD-10-CM | POA: Diagnosis not present

## 2023-06-29 DIAGNOSIS — Z113 Encounter for screening for infections with a predominantly sexual mode of transmission: Secondary | ICD-10-CM

## 2023-06-29 DIAGNOSIS — B20 Human immunodeficiency virus [HIV] disease: Secondary | ICD-10-CM

## 2023-06-29 MED ORDER — LEVOTHYROXINE SODIUM 125 MCG PO TABS: 125.0000 ug | ORAL_TABLET | Freq: Every day | ORAL | 1 refills | Status: DC

## 2023-06-29 NOTE — Telephone Encounter (Signed)
Pt called to schedule and have labs completed same day as nurse visit prior to seeing Dr. Daiva Eves.   He did request if it is possible to add an STI panel and do an anal swab as well to his labs?   I did let him know that I will send this over to see if this can be ordered in addition to his routine labs.   Verified contact [phone]

## 2023-06-29 NOTE — Progress Notes (Signed)
06/29/2023, 4:10 PM   Endocrinology follow-up note  Subjective:    Patient ID: Kenneth Mason, male    DOB: [birth year], PCP Patient, No Pcp Per   Past Medical History:  Diagnosis Date   Anal wart 11/21/2014   Anxiety    Benign rectal wall submucosal cyst s/p TEM partial proctectomy Jan2013     Depression    Elevated blood pressure reading 03/04/2023   Flank pain 08/16/2020   Headache in front of head    q 2 weeks. No n/v, no photophobia. 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   Rectal polyp 1999   polyp excised   Rib pain 08/16/2020   RUQ pain 08/16/2020   Screen for STD (sexually transmitted disease) 05/13/2015   Testicular mass 03/04/2023   Thyroid nodule 04/14/2021   Vaccine counseling 03/04/2023   Past Surgical History:  Procedure Laterality Date   BIOPSY THYROID  10/10/2021   DENTAL SURGERY  06/01/2008   Wisdom Teeth    EUS  04/09/2011   Procedure: LOWER ENDOSCOPIC ULTRASOUND (EUS);  Surgeon: Rob Bunting, MD;  Location: [facility];  Service: Endoscopy;  Laterality: N/A;   FLEXIBLE SIGMOIDOSCOPY  04/09/2011   Procedure: FLEXIBLE SIGMOIDOSCOPY;  Surgeon: Rob Bunting, MD;  Location: [facility];  Service: Endoscopy;  Laterality: N/A;   THYROIDECTOMY Bilateral 12/31/2021   Procedure: TOTAL THYROIDECTOMY;  Surgeon: Serena Colonel, MD;  Location: [facility] OR;  Service: ENT;  Laterality: Bilateral;   TRANSANAL ENDOSCOPIC MICROSURGERY  06/12/2011   Procedure: TRANSANAL ENDOSCOPIC MICROSURGERY;  Surgeon: Ardeth Sportsman, MD;  Location: [facility];  Service: General;   Laterality: N/A;  partial proctectomy by TEM   Social History   Socioeconomic History   Marital status: Divorced    Spouse name: Not on file   Number of children: 2   Years of education: 14   Highest education level: Not on file  Occupational History   Occupation: Customer Service   Tobacco Use   Smoking status: Never   Smokeless tobacco: Never  Substance and Sexual Activity   Alcohol use: Not Currently    Comment: very rarely- few times a year   Drug use: No   Sexual activity: Yes    Partners: Male    Birth control/protection: Condom    Comment: given condoms  Other Topics Concern   Not on file  Social History Narrative   HSG, 2 yrs college. Married '04 - seperated (2012). !son ' 2005; 1  dtr - '08. 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  Lives with children. Wife/ mother is abroad (aug '12).   Social Drivers of Corporate investment banker Strain: Not on file  Food Insecurity: Not on file  Transportation Needs: Not on file  Physical Activity: Not on file  Stress: Not on file  Social Connections: Not on file   Family History  Problem Relation Age of Onset   Hypertension Mother    Cancer Maternal Grandfather        unsure of type    Liver cancer Paternal Grandmother    Stroke Paternal Grandmother    Heart disease Paternal Grandfather        CAD/MI-fatal   Outpatient Encounter Medications as of 06/29/2023  Medication Sig   Cholecalciferol 50 MCG (2000 UT) CAPS Take 2,000 Units by mouth daily with lunch.   [DISCONTINUED] levothyroxine (SYNTHROID) 100 MCG tablet Take 100 mcg by mouth daily. Take 1 tablet daily except 2 days of the week take 1 1/2 tablets daily   atorvastatin (LIPITOR) 10 MG tablet Take 1 tablet (10 mg total) by mouth daily.   emtricitabine-rilpivir-tenofovir AF (ODEFSEY) 200-25-25 MG TABS tablet TAKE ONE TABLET BY MOUTH ONCE DAILY WITH BREAKFAST.   levothyroxine  (SYNTHROID) 125 MCG tablet Take 1 tablet (125 mcg total) by mouth daily. Take 1 tablet daily except 2 days of the week take 1 1/2 tablets daily   [DISCONTINUED] levothyroxine (SYNTHROID) 112 MCG tablet Take 1 tablet (112 mcg total) by mouth daily.   No facility-administered encounter medications on file as of 06/29/2023.   ALLERGIES: No Known Allergies  VACCINATION STATUS: Immunization History  Administered Date(s) Administered   HPV 9-valent 12/02/2022, 01/01/2023   Hepatitis A 12/15/2010, 05/19/2011   Hepatitis B 12/15/2010, 05/19/2011, 06/28/2012   Influenza Split 05/19/2011, 06/28/2012   Influenza,inj,Quad PF,6+ Mos 06/20/2013, 05/06/2016, 05/03/2017, 04/14/2021   Meningococcal Mcv4o 05/03/2017, 12/07/2017   PFIZER(Purple Top)SARS-COV-2 Vaccination 08/11/2019, 09/06/2019   Pfizer Covid-19 Vaccine Bivalent Booster 55yrs & up 04/14/2021   Pneumococcal Conjugate-13 12/07/2017   Pneumococcal Polysaccharide-23 12/26/2018   Td 12/15/2010   Tdap 12/15/2010    HPI Kenneth Mason is 48 y.o. male who presents today with a medical history as above. he is being seen in follow-up after he was seen in consultation for  postsurgical hypothyroidism, history of thyroid cancer requested by Dr. Beverlee Nims, MD .  He is currently on levothyroxine 100-112 mcg p.o. daily. -See notes from previous visit. Review of his medical records indicated that he underwent total thyroidectomy in August 2023 after workup indicated thyroid malignancy.  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  - Microscopic focus confined within left lobe.  - Margins negative for involvement.   His pre-visit TFTs are c/w under-replacement.    He denies dysphagia, shortness of breath, nor voice change.  He has  recovered  from the surgery very well. Patient did have bilateral thyroid nodules on  presurgical ultrasound.  He denies exposure to neck radiation, no family history of thyroid dysfunction or thyroid malignancy.  He was sent for new set of labs after his last visit.  He has no new complaints today.  Review of Systems   Objective:       06/29/2023    1:28 PM 03/04/2023    3:25 PM 03/04/2023    2:58 PM  Vitals with BMI  Height 5\' 10"     Weight 172 lbs 13 oz  169 lbs  BMI 24.79    Systolic 134 123 478  Diastolic 88 85 84  Pulse 64  75    BP 134/88   Pulse 64   Ht 5\' 10"  (1.778 m)   Wt 172 lb 12.8 oz (78.4 kg)  BMI 24.79 kg/m   Wt Readings from Last 3 Encounters:  06/29/23 172 lb 12.8 oz (78.4 kg)  03/04/23 169 lb (76.7 kg)  01/19/23 172 lb (78 kg)    Physical Exam  Constitutional:  Body mass index is 24.79 kg/m.,  not in acute distress, normal state of mind Eyes: PERRLA, EOMI, no exophthalmos ENT: moist mucous membranes, post thyroidectomy surgical scar on anterior lower neck,  no gross cervical lymphadenopathy   CMP ( most recent) CMP     Component Value Date/Time   NA 140 01/05/2023 1541   NA 141 05/15/2022 0917   K 4.1 01/05/2023 1541   CL 104 01/05/2023 1541   CO2 28 01/05/2023 1541   GLUCOSE 68 01/05/2023 1541   BUN 17 01/05/2023 1541   BUN 14 05/15/2022 0917   CREATININE 1.35 (H) 01/05/2023 1541   CALCIUM 9.2 01/05/2023 1541   PROT 6.4 01/05/2023 1541   PROT 6.8 05/15/2022 0917   ALBUMIN 4.3 05/15/2022 0917   AST 18 01/05/2023 1541   ALT 13 01/05/2023 1541   ALKPHOS 93 05/15/2022 0917   BILITOT 0.4 01/05/2023 1541   BILITOT 0.4 05/15/2022 0917   GFRNONAA 67 07/11/2020 1201   GFRAA 78 07/11/2020 1201     Diabetic Labs (most recent): Lab Results  Component Value Date   MICROALBUR 0.50 12/12/2013     Lipid Panel ( most recent) Lipid Panel     Component Value Date/Time   CHOL 163 04/15/2022 1519   TRIG 144 04/15/2022 1519   HDL 43 04/15/2022 1519   CHOLHDL 3.8 04/15/2022 1519   VLDL 10 11/02/2016 1203   LDLCALC 96  04/15/2022 1519      Latest Reference Range & Units 05/15/22 09:17  TSH 0.450 - 4.500 uIU/mL 2.550  T4,Free(Direct) 0.82 - 1.77 ng/dL 1.61  Thyroglobulin Antibody 0.0 - 0.9 IU/mL <1.0  THYROGLOBULIN (TG-RIA)  WILL FOLLOW (P)  (P): Preliminary   Assessment & Plan:   1. Postsurgical hypothyroidism 2. Malignant neoplasm of thyroid gland [facility]) -Patient was recently seen as a consult status post thyroidectomy.  he has  had 0.5 cm unilateral, unifocal follicular variant papillary thyroid cancer status post total thyroidectomy and currently with postsurgical hypothyroidism.  I had a long discussion with him about approaches of therapy for thyroid cancer including surgery, radioactive iodine thyroid remnant ablation, and subsequent follow-up.  Considering his microscopic, unifocal, unilateral disease, he is low risk for tumor recurrence and hence may not need radioactive iodine treatment at this time. His thyroglobulin is still pending.  He will be considered for surveillance thyroid/neck ultrasound in a year.  His previsit labs are such that he would benefit from slight increase in his levothyroxine dose.  I discussed and prescribed levothyroxine 125 mcg p.o. daily before breakfast.    - We discussed about the correct intake of his thyroid hormone, on empty stomach at fasting, with water, separated by at least 30 minutes from breakfast and other medications,  and separated by more than 4 hours from calcium, iron, multivitamins, acid reflux medications (PPIs). -Patient is made aware of the fact that thyroid hormone replacement is needed for life, dose to be adjusted by periodic monitoring of thyroid function tests. He is advised to continue Lipitor 10 mg p.o. nightly.  He would also benefit from low-dose vitamin D3 2000 units daily.  Has complaints of mild tingling on his fingertips bilaterally.  He will have B12 levels measured along with his thyroid labs during his next visit.  - he  is advised  to maintain close follow up with his PCP for primary care needs.     I spent  22  minutes in the care of the patient today including review of labs from Thyroid Function, CMP, and other relevant labs ; imaging/biopsy records (current and previous including abstractions from other facilities); face-to-face time discussing  his lab results and symptoms, medications doses, his options of short and long term treatment based on the latest standards of care / guidelines;   and documenting the encounter.  Zeb Comfort  participated in the discussions, expressed understanding, and voiced agreement with the above plans.  All questions were answered to his satisfaction. he is encouraged to contact clinic should he have any questions or concerns prior to his return visit.   Follow up plan: Return in about 6 months (around 12/27/2023) for F/U with Pre-visit Labs.   Marquis Lunch, MD [facility] [address] [place], [place] [address] Phone: [phone]  Fax: [phone]     06/29/2023, 4:10 PM  This note was partially dictated with voice recognition software. Similar sounding words can be transcribed inadequately or may not  be corrected upon review.

## 2023-07-05 ENCOUNTER — Other Ambulatory Visit (HOSPITAL_COMMUNITY)
Admission: RE | Admit: 2023-07-05 | Discharge: 2023-07-05 | Disposition: A | Discharge status: Home / Self Care | Payer: BC Managed Care – PPO | Source: Ambulatory Visit | Attending: Infectious Disease | Admitting: Infectious Disease

## 2023-07-05 ENCOUNTER — Ambulatory Visit (INDEPENDENT_AMBULATORY_CARE_PROVIDER_SITE_OTHER): Payer: BC Managed Care – PPO

## 2023-07-05 ENCOUNTER — Other Ambulatory Visit: Payer: Self-pay

## 2023-07-05 ENCOUNTER — Other Ambulatory Visit: Payer: BC Managed Care – PPO

## 2023-07-05 DIAGNOSIS — Z23 Encounter for immunization: Secondary | ICD-10-CM | POA: Diagnosis not present

## 2023-07-05 DIAGNOSIS — B20 Human immunodeficiency virus [HIV] disease: Secondary | ICD-10-CM | POA: Diagnosis present

## 2023-07-05 DIAGNOSIS — Z7185 Encounter for immunization safety counseling: Secondary | ICD-10-CM | POA: Diagnosis not present

## 2023-07-05 DIAGNOSIS — Z113 Encounter for screening for infections with a predominantly sexual mode of transmission: Secondary | ICD-10-CM | POA: Insufficient documentation

## 2023-07-05 LAB — THYROGLOBULIN ANTIBODY: Thyroglobulin Antibody: <1 [IU]/mL (ref 0.0–0.9)

## 2023-07-05 LAB — TSH: TSH: 2.82 u[IU]/mL (ref 0.450–4.500)

## 2023-07-05 LAB — THYROGLOBULIN LEVEL: Thyroglobulin (TG-RIA): <2 ng/mL

## 2023-07-05 LAB — T4, FREE: Free T4: 1.39 ng/dL (ref 0.82–1.77)

## 2023-07-06 LAB — URINE CYTOLOGY ANCILLARY ONLY
Chlamydia: NEGATIVE
Comment: NEGATIVE
Comment: NORMAL
Neisseria Gonorrhea: NEGATIVE

## 2023-07-06 LAB — T-HELPER CELL (CD4) - (RCID CLINIC ONLY)
CD4 % Helper T Cell: 24 % — ABNORMAL LOW (ref 33–65)
CD4 T Cell Abs: 641 /uL (ref 400–1790)

## 2023-07-06 LAB — CYTOLOGY, (ORAL, ANAL, URETHRAL) ANCILLARY ONLY
Chlamydia: NEGATIVE
Chlamydia: NEGATIVE
Comment: NEGATIVE
Comment: NEGATIVE
Comment: NORMAL
Comment: NORMAL
Neisseria Gonorrhea: NEGATIVE
Neisseria Gonorrhea: NEGATIVE

## 2023-07-07 LAB — RPR: RPR Ser Ql: NONREACTIVE

## 2023-07-07 LAB — COMPLETE METABOLIC PANEL WITH GFR
AG Ratio: 1.8 (calc) (ref 1.0–2.5)
ALT: 17 U/L (ref 9–46)
AST: 18 U/L (ref 10–40)
Albumin: 4.5 g/dL (ref 3.6–5.1)
Alkaline phosphatase (APISO): 68 U/L (ref 36–130)
BUN/Creatinine Ratio: 7 (calc) (ref 6–22)
BUN: 11 mg/dL (ref 7–25)
CO2: 30 mmol/L (ref 20–32)
Calcium: 9.3 mg/dL (ref 8.6–10.3)
Chloride: 103 mmol/L (ref 98–110)
Creat: 1.48 mg/dL — ABNORMAL HIGH (ref 0.60–1.29)
Globulin: 2.5 g/dL (ref 1.9–3.7)
Glucose, Bld: 58 mg/dL — ABNORMAL LOW (ref 65–99)
Potassium: 4 mmol/L (ref 3.5–5.3)
Sodium: 141 mmol/L (ref 135–146)
Total Bilirubin: 0.5 mg/dL (ref 0.2–1.2)
Total Protein: 7 g/dL (ref 6.1–8.1)
eGFR: 59 mL/min/{1.73_m2} — ABNORMAL LOW (ref >=60)

## 2023-07-07 LAB — CBC WITH DIFFERENTIAL/PLATELET
Absolute Lymphocytes: 3033 {cells}/uL (ref 850–3900)
Absolute Monocytes: 419 {cells}/uL (ref 200–950)
Basophils Absolute: 18 {cells}/uL (ref 0–200)
Basophils Relative: 0.3 %
Eosinophils Absolute: 47 {cells}/uL (ref 15–500)
Eosinophils Relative: 0.8 %
HCT: 45.7 % (ref 38.5–50.0)
Hemoglobin: 15.7 g/dL (ref 13.2–17.1)
MCH: 29.2 pg (ref 27.0–33.0)
MCHC: 34.4 g/dL (ref 32.0–36.0)
MCV: 84.9 fL (ref 80.0–100.0)
MPV: 9.3 fL (ref 7.5–12.5)
Monocytes Relative: 7.1 %
Neutro Abs: 2384 {cells}/uL (ref 1500–7800)
Neutrophils Relative %: 40.4 %
Platelets: 222 10*3/uL (ref 140–400)
RBC: 5.38 10*6/uL (ref 4.20–5.80)
RDW: 14.3 % (ref 11.0–15.0)
Total Lymphocyte: 51.4 %
WBC: 5.9 10*3/uL (ref 3.8–10.8)

## 2023-07-07 LAB — HIV-1 RNA QUANT-NO REFLEX-BLD
HIV 1 RNA Quant: NOT DETECTED {copies}/mL
HIV-1 RNA Quant, Log: NOT DETECTED {Log}

## 2023-07-25 NOTE — Progress Notes (Unsigned)
 Subjective:   Chief complaint: follow-up for HIV disease on medications    Patient ID: Kenneth Mason, male    DOB: 1976-01-22, 48 y.o.   MRN: [id number]  HPI  Discussed the use of AI scribe software for clinical note transcription with the patient, who gave verbal consent to proceed.  History of Present Illness   The patient, with a history of thyroid issues and HIV, presents with concerns about his creatinine and glucose levels. He reports that his endocrinologist recently increased his thyroid medication dosage. He takes his thyroid medication on an empty stomach as instructed. He is also on Odefsey for his HIV. He had a recent issue with testicular pain, which was found to be a cyst and has since resolved. He has been monitoring his blood pressure at home, which has been trending down. However, he has noticed that his creatinine levels have been trending up and his glucose levels have been low. He has made dietary changes, such as reducing his protein intake, in response to his concerns about his creatinine levels. He is also on atorvastatin.      Past Medical History:  Diagnosis Date   Anal wart 11/21/2014   Anxiety    Benign rectal wall submucosal cyst s/p TEM partial proctectomy Jan2013     Depression    Elevated blood pressure reading 03/04/2023   Flank pain 08/16/2020   Headache in front of head    q 2 weeks. No n/v, no photophobia. 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   Rectal polyp 1999   polyp excised   Rib pain 08/16/2020   RUQ pain 08/16/2020   Screen for STD (sexually transmitted disease) 05/13/2015    Testicular mass 03/04/2023   Thyroid nodule 04/14/2021   Vaccine counseling 03/04/2023    Past Surgical History:  Procedure Laterality Date   BIOPSY THYROID  10/10/2021   DENTAL SURGERY  06/01/2008   Wisdom Teeth    EUS  04/09/2011   Procedure: LOWER ENDOSCOPIC ULTRASOUND (EUS);  Surgeon: Rob Bunting, MD;  Location: [facility];  Service: Endoscopy;  Laterality: N/A;   FLEXIBLE SIGMOIDOSCOPY  04/09/2011   Procedure: FLEXIBLE SIGMOIDOSCOPY;  Surgeon: Rob Bunting, MD;  Location: [facility];  Service: Endoscopy;  Laterality: N/A;   THYROIDECTOMY Bilateral 12/31/2021   Procedure: TOTAL THYROIDECTOMY;  Surgeon: Serena Colonel, MD;  Location: [facility] OR;  Service: ENT;  Laterality: Bilateral;   TRANSANAL ENDOSCOPIC MICROSURGERY  06/12/2011   Procedure: TRANSANAL ENDOSCOPIC MICROSURGERY;  Surgeon: Ardeth Sportsman, MD;  Location: [facility];  Service: General;  Laterality: N/A;  partial proctectomy by TEM    Family History  Problem Relation Age of Onset   Hypertension Mother    Cancer Maternal Grandfather        unsure of type    Liver cancer Paternal Grandmother    Stroke Paternal Grandmother    Heart disease Paternal Grandfather        CAD/MI-fatal      Social History   Socioeconomic History   Marital status: Divorced    Spouse name: Not on file   Number of children: 2   Years of education:  14   Highest education level: Not on file  Occupational History   Occupation: Customer Service   Tobacco Use   Smoking status: Never   Smokeless tobacco: Never  Substance and Sexual Activity   Alcohol use: Not Currently    Comment: very rarely- few times a year   Drug use: No   Sexual activity: Yes    Partners: Male    Birth control/protection: Condom    Comment: given condoms  Other Topics Concern   Not on file  Social History Narrative   HSG, 2 yrs college. Married '04 - seperated (2012). !son ' 2005; 1  dtr - '08. 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  Lives with children. Wife/ mother is abroad (aug '12).   Social Drivers of Corporate investment banker Strain: Not on file  Food Insecurity: Not on file  Transportation Needs: Not on file  Physical Activity: Not on file  Stress: Not on file  Social Connections: Not on file    No Known Allergies   Current Outpatient Medications:    atorvastatin (LIPITOR) 10 MG tablet, Take 1 tablet (10 mg total) by mouth daily., Disp: 90 tablet, Rfl: 1   Cholecalciferol 50 MCG (2000 UT) CAPS, Take 2,000 Units by mouth daily with lunch., Disp: , Rfl:    emtricitabine-rilpivir-tenofovir AF (ODEFSEY) 200-25-25 MG TABS tablet, TAKE ONE TABLET BY MOUTH ONCE DAILY WITH BREAKFAST., Disp: 30 tablet, Rfl: 11   levothyroxine (SYNTHROID) 125 MCG tablet, Take 1 tablet (125 mcg total) by mouth daily. Take 1 tablet daily except 2 days of the week take 1 1/2 tablets daily, Disp: 90 tablet, Rfl: 1    Review of Systems  Constitutional:  Negative for activity change, appetite change, chills, diaphoresis, fatigue, fever and unexpected weight change.  HENT:  Negative for congestion, rhinorrhea, sinus pressure, sneezing, sore throat and trouble swallowing.   Eyes:  Negative for photophobia and visual disturbance.  Respiratory:  Negative for cough, chest tightness, shortness of breath, wheezing and stridor.   Cardiovascular:  Negative for chest pain, palpitations and leg swelling.  Gastrointestinal:  Negative for abdominal distention, abdominal pain, anal bleeding, blood in stool, constipation, diarrhea, nausea and vomiting.  Genitourinary:  Negative for difficulty urinating, dysuria, flank pain and hematuria.  Musculoskeletal:  Negative for arthralgias, back pain, gait problem, joint swelling and myalgias.  Skin:  Negative for color change, pallor, rash and wound.  Neurological:  Negative for dizziness, tremors, weakness and light-headedness.  Hematological:   Negative for adenopathy. Does not bruise/bleed easily.  Psychiatric/Behavioral:  Negative for agitation, behavioral problems, confusion, decreased concentration, dysphoric mood and sleep disturbance.        Objective:   Physical Exam Constitutional:      Appearance: He is well-developed.  HENT:     Head: Normocephalic and atraumatic.  Eyes:     Conjunctiva/sclera: Conjunctivae normal.  Cardiovascular:     Rate and Rhythm: Normal rate and regular rhythm.  Pulmonary:     Effort: Pulmonary effort is normal. No respiratory distress.     Breath sounds: No wheezing.  Abdominal:     General: There is no distension.     Palpations: Abdomen is soft.  Musculoskeletal:        General: No tenderness. Normal range of motion.     Cervical back: Normal range of motion and neck supple.  Skin:    General: Skin is warm and dry.  Coloration: Skin is not pale.     Findings: No erythema or rash.  Neurological:     General: No focal deficit present.     Mental Status: He is alert and oriented to person, place, and time.  Psychiatric:        Mood and Affect: Mood normal.        Behavior: Behavior normal.        Thought Content: Thought content normal.        Judgment: Judgment normal.           Assessment & Plan:   Assessment and Plan    Hypertension Elevated blood pressure noted in previous visit. Patient reports home blood pressure readings are trending down. No current antihypertensive medication. -Check blood pressure today. -Request patient to send blood pressure readings via MyChart. --check micro-albumin to creatinine  Elevated Creatinine--it is BARELY 0.1 above normal range but he is worried about it  Patient concerned about elevated creatinine levels. History of variable creatinine levels over the past 8 years. Possible contributing factors include blood pressure and past use of TDF compounds  -Order metabolic panel to recheck creatinine. --creatine to  micro-albumin  Hypoglycemia Patient reports low glucose levels. No symptoms of hypoglycemia reported. No insulin use. He does not believe he was fasting and he denies any etoh use. -Order blood sugar test today. --forward note to his Endocrinologist to see if further workup needed  Hypothyroidism Patient reports recent increase in thyroid medication dosage by endocrinologist. -Continue current thyroid medication regimen as directed by endocrinologist.  Testicular Cyst Previous complaint of testicular pain. Urology evaluation revealed cyst. Patient reports resolution of pain. -No further action required at this time.  HIV HIV RNA ND, CD4 is healthy Patient on Odefsey. -Continue Odefsey as prescribed.  Hyperlipidemia Patient on Atorvastatin. -Continue Atorvastatin as prescribed.  Follow-up in 6 months unless new concerns arise.     Vaccine counseling/l recommended DTAP , Influenza and COVID and we had former 2 but he wished to defer to future visit.

## 2023-07-26 ENCOUNTER — Ambulatory Visit (INDEPENDENT_AMBULATORY_CARE_PROVIDER_SITE_OTHER): Payer: BC Managed Care – PPO | Admitting: Infectious Disease

## 2023-07-26 ENCOUNTER — Other Ambulatory Visit: Payer: Self-pay

## 2023-07-26 VITALS — BP 126/86 | HR 65 | Temp 98.3°F | Wt 164.0 lb

## 2023-07-26 DIAGNOSIS — N5089 Other specified disorders of the male genital organs: Secondary | ICD-10-CM

## 2023-07-26 DIAGNOSIS — R041 Hemorrhage from throat: Secondary | ICD-10-CM

## 2023-07-26 DIAGNOSIS — E162 Hypoglycemia, unspecified: Secondary | ICD-10-CM | POA: Diagnosis not present

## 2023-07-26 DIAGNOSIS — E890 Postprocedural hypothyroidism: Secondary | ICD-10-CM

## 2023-07-26 DIAGNOSIS — Z113 Encounter for screening for infections with a predominantly sexual mode of transmission: Secondary | ICD-10-CM

## 2023-07-26 DIAGNOSIS — B20 Human immunodeficiency virus [HIV] disease: Secondary | ICD-10-CM

## 2023-07-26 DIAGNOSIS — R944 Abnormal results of kidney function studies: Secondary | ICD-10-CM | POA: Diagnosis not present

## 2023-07-26 DIAGNOSIS — E785 Hyperlipidemia, unspecified: Secondary | ICD-10-CM

## 2023-07-26 DIAGNOSIS — Z7185 Encounter for immunization safety counseling: Secondary | ICD-10-CM

## 2023-07-26 DIAGNOSIS — R7989 Other specified abnormal findings of blood chemistry: Secondary | ICD-10-CM

## 2023-07-26 DIAGNOSIS — C73 Malignant neoplasm of thyroid gland: Secondary | ICD-10-CM

## 2023-07-26 DIAGNOSIS — A630 Anogenital (venereal) warts: Secondary | ICD-10-CM

## 2023-07-27 LAB — BASIC METABOLIC PANEL WITH GFR
BUN/Creatinine Ratio: 9 (calc) (ref 6–22)
BUN: 12 mg/dL (ref 7–25)
CO2: 30 mmol/L (ref 20–32)
Calcium: 9.2 mg/dL (ref 8.6–10.3)
Chloride: 104 mmol/L (ref 98–110)
Creat: 1.31 mg/dL — ABNORMAL HIGH (ref 0.60–1.29)
Glucose, Bld: 84 mg/dL (ref 65–99)
Potassium: 4.1 mmol/L (ref 3.5–5.3)
Sodium: 140 mmol/L (ref 135–146)
eGFR: 68 mL/min/{1.73_m2} (ref >=60)

## 2023-07-27 LAB — MICROALBUMIN / CREATININE URINE RATIO
Creatinine, Urine: 187 mg/dL (ref 20–320)
Microalb Creat Ratio: 4 mg/g{creat} (ref <30)
Microalb, Ur: 0.7 mg/dL

## 2023-09-02 ENCOUNTER — Other Ambulatory Visit: Payer: Self-pay | Admitting: "Endocrinology

## 2023-09-02 DIAGNOSIS — E890 Postprocedural hypothyroidism: Secondary | ICD-10-CM

## 2023-09-02 MED ORDER — LEVOTHYROXINE SODIUM 125 MCG PO TABS: 125.0000 ug | ORAL_TABLET | Freq: Every day | ORAL | 1 refills | Status: DC

## 2023-09-07 ENCOUNTER — Other Ambulatory Visit: Payer: Self-pay

## 2023-09-07 DIAGNOSIS — E890 Postprocedural hypothyroidism: Secondary | ICD-10-CM

## 2023-09-07 MED ORDER — LEVOTHYROXINE SODIUM 125 MCG PO TABS: 125.0000 ug | ORAL_TABLET | Freq: Every day | ORAL | 0 refills | Status: DC

## 2023-10-12 ENCOUNTER — Encounter: Payer: Self-pay | Admitting: Infectious Disease

## 2023-10-19 NOTE — Progress Notes (Signed)
 The 10-year ASCVD risk score (Arnett DK, et al., 2019) is: 4.2%   Values used to calculate the score:     Age: 48 years     Sex: Male     Is Non-Hispanic African American: Yes     Diabetic: No     Tobacco smoker: No     Systolic Blood Pressure: 127 mmHg     Is BP treated: No     HDL Cholesterol: 43 mg/dL     Total Cholesterol: 163 mg/dL  Currently prescribed atorvastatin  10 mg.  Wilmar Prabhakar, BSN, RN

## 2023-11-15 ENCOUNTER — Ambulatory Visit (INDEPENDENT_AMBULATORY_CARE_PROVIDER_SITE_OTHER): Admitting: General Practice

## 2023-11-15 ENCOUNTER — Encounter: Payer: Self-pay | Admitting: General Practice

## 2023-11-15 ENCOUNTER — Ambulatory Visit (INDEPENDENT_AMBULATORY_CARE_PROVIDER_SITE_OTHER)
Admission: RE | Admit: 2023-11-15 | Discharge: 2023-11-15 | Disposition: A | Discharge status: Home / Self Care | Source: Ambulatory Visit | Attending: General Practice | Admitting: General Practice

## 2023-11-15 VITALS — BP 124/82 | HR 71 | Temp 98.3°F | Ht 70.0 in | Wt 165.0 lb

## 2023-11-15 DIAGNOSIS — G8929 Other chronic pain: Secondary | ICD-10-CM | POA: Insufficient documentation

## 2023-11-15 DIAGNOSIS — M25531 Pain in right wrist: Secondary | ICD-10-CM

## 2023-11-15 DIAGNOSIS — B20 Human immunodeficiency virus [HIV] disease: Secondary | ICD-10-CM | POA: Diagnosis not present

## 2023-11-15 DIAGNOSIS — E890 Postprocedural hypothyroidism: Secondary | ICD-10-CM

## 2023-11-15 DIAGNOSIS — L659 Nonscarring hair loss, unspecified: Secondary | ICD-10-CM | POA: Insufficient documentation

## 2023-11-15 DIAGNOSIS — Z7689 Persons encountering health services in other specified circumstances: Secondary | ICD-10-CM | POA: Diagnosis not present

## 2023-11-15 DIAGNOSIS — E785 Hyperlipidemia, unspecified: Secondary | ICD-10-CM

## 2023-11-15 DIAGNOSIS — R7989 Other specified abnormal findings of blood chemistry: Secondary | ICD-10-CM

## 2023-11-15 NOTE — Assessment & Plan Note (Signed)
 Lipid panel pending.  Does not take Atorvastatin  as prescribed.  Discussed the importance of medication adherence.

## 2023-11-15 NOTE — Assessment & Plan Note (Signed)
 Suspect it could be secondary to early carpel tunnel syndrome.   X-ray pending.  Discussed to continue using brace at night.  Rest.  Consider ortho referral if not better.

## 2023-11-15 NOTE — Assessment & Plan Note (Signed)
 Controlled.  Followed by infectious disease.  Reviewed notes and labs from last visit.

## 2023-11-15 NOTE — Assessment & Plan Note (Signed)
 Unclear etiology.  Reviewed labs with patient.   CMP, lipid panel and A1c pending.  Await results.

## 2023-11-15 NOTE — Progress Notes (Signed)
 New Patient Office Visit  Subjective    Patient ID: Kenneth Mason, male    DOB: Jul 25, 1975  Age: 48 y.o. MRN: [id number]  CC:  Chief Complaint  Patient presents with   New Patient (Initial Visit)    Patient has not had a PCP in years and years, cannot remember who he saw.  Right wrist pain for a couple weeks, has been wearing brace and seems a little better     HPI Kenneth Mason is a 48 y.o. male presents to establish care.   Previous PCP- many years ago. Last physical and labs- many years ago.   Right wrist pain: chronic. Types at work. Symptom onset was two week ago. He has been using a brace as needed. Pain does not radiate. Constant and dull achy pain. Has not tried anything else.   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 Followed by endocrinologist. Currently managed on levothyroxine  125 mcg once daily. Last levels were checked in January. No concerns today.   HIV- following with infectious disease. Currently managed on Odefsey  200-25-25 mg once daily.   HLD: diagnosed many years ago. Started on Lipitor 10 mg once daily by infectious disease for CAD protection but patient does not take it. Denies any chest pain, shortness of breath or difficulty breathing.   Alopecia- currently managed on finasteride 5 mg once daily and minoxidil 2.5 mg from online provider. Noticed that it has been helping.   Outpatient Encounter Medications as of 11/15/2023  Medication Sig   emtricitabine -rilpivir-tenofovir  AF (ODEFSEY ) 200-25-25 MG TABS tablet TAKE ONE TABLET BY MOUTH ONCE DAILY WITH BREAKFAST.   finasteride (PROSCAR) 5 MG tablet Take 5 mg by mouth.   levothyroxine  (SYNTHROID ) 125 MCG tablet Take 1 tablet (125 mcg total) by mouth daily.   minoxidil (LONITEN) 2.5 MG tablet Take 2.5 mg by mouth.   atorvastatin  (LIPITOR) 10 MG tablet Take 1 tablet (10 mg total) by mouth daily. (Patient not taking: Reported on 11/15/2023)   Cholecalciferol 50 MCG (2000 UT) CAPS  Take 2,000 Units by mouth daily with lunch. (Patient not taking: Reported on 11/15/2023)   No facility-administered encounter medications on file as of 11/15/2023.    Past Medical History:  Diagnosis Date   Anal wart 11/21/2014   Anxiety    Benign rectal wall submucosal cyst s/p TEM partial proctectomy Jan2013     Depression    Elevated blood pressure reading 03/04/2023   Flank pain 08/16/2020   Headache in front of head    q 2 weeks. No n/v, no photophobia. 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   Rectal polyp 1999   polyp excised   Rib pain 08/16/2020   RUQ pain 08/16/2020   Screen for STD (sexually transmitted disease) 05/13/2015   Testicular mass 03/04/2023   Thyroid  nodule 04/14/2021   Vaccine counseling 03/04/2023    Past Surgical History:  Procedure Laterality Date   BIOPSY THYROID   10/10/2021   DENTAL SURGERY  06/01/2008   Wisdom Teeth    EUS  04/09/2011   Procedure: LOWER ENDOSCOPIC ULTRASOUND (EUS);  Surgeon: Hoyt Macleod, MD;  Location: [facility];  Service: Endoscopy;  Laterality: N/A;   FLEXIBLE SIGMOIDOSCOPY  04/09/2011   Procedure: FLEXIBLE SIGMOIDOSCOPY;  Surgeon: Hoyt Macleod, MD;  Location: [facility];  Service: Endoscopy;  Laterality: N/A;   THYROIDECTOMY Bilateral 12/31/2021   Procedure: TOTAL THYROIDECTOMY;  Surgeon: Janita Mellow, MD;  Location: [facility] OR;  Service: ENT;  Laterality: Bilateral;   TRANSANAL ENDOSCOPIC MICROSURGERY  06/12/2011   Procedure: TRANSANAL ENDOSCOPIC MICROSURGERY;  Surgeon: Eddye Goodie, MD;  Location: [facility];  Service: General;  Laterality: N/A;  partial  proctectomy by TEM    Family History  Problem Relation Age of Onset   Hypertension Mother    Cancer Maternal Grandfather        unsure of type    Liver cancer Paternal Grandmother    Stroke Paternal Grandmother    Heart disease Paternal Grandfather        CAD/MI-fatal    Social History   Socioeconomic History   Marital status: Divorced    Spouse name: Not on file   Number of children: 2   Years of education: 14   Highest education level: Not on file  Occupational History   Occupation: Clinical biochemist   Tobacco Use   Smoking status: Never   Smokeless tobacco: Never  Substance and Sexual Activity   Alcohol use: Not Currently    Comment: very rarely- few times a year   Drug use: No   Sexual activity: Yes    Partners: Male    Birth control/protection: Condom    Comment: given condoms  Other Topics Concern   Not on file  Social History Narrative   HSG, 2 yrs college. Married '04 - seperated (2012). !son ' 2005; 1  dtr - '08. 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  Lives with children. Wife/ mother is abroad (aug '12).   Social Drivers of Corporate investment banker Strain: Not on file  Food Insecurity: Not on file  Transportation Needs: Not on file  Physical Activity: Not on file  Stress: Not on file  Social Connections: Not on file  Intimate Partner Violence: Not on file    Review of Systems  Constitutional:  Negative for chills and fever.  Respiratory:  Negative for shortness of breath.   Cardiovascular:  Negative for chest pain.  Gastrointestinal:  Negative for abdominal pain, constipation, diarrhea, heartburn, nausea and vomiting.  Genitourinary:  Negative for dysuria, frequency and urgency.  Musculoskeletal:  Positive for joint pain.       Right wrist pain.  Neurological:  Negative for dizziness and headaches.  Endo/Heme/Allergies:  Negative for polydipsia.   Psychiatric/Behavioral:  Negative for depression and suicidal ideas. The patient is not nervous/anxious.         Objective    BP 124/82   Pulse 71   Temp 98.3 F (36.8 C) (Oral)   Ht 5' 10 (1.778 m)   Wt 165 lb (74.8 kg)   SpO2 96%   BMI 23.68 kg/m   Physical Exam Vitals and nursing note reviewed.  Constitutional:      Appearance: Normal appearance.   Cardiovascular:     Rate and Rhythm: Normal rate and regular rhythm.     Pulses: Normal pulses.     Heart sounds: Normal heart sounds.  Pulmonary:     Effort: Pulmonary effort is normal.     Breath sounds: Normal breath sounds.   Neurological:     Mental Status: He is  alert and oriented to person, place, and time.   Psychiatric:        Mood and Affect: Mood normal.        Behavior: Behavior normal.        Thought Content: Thought content normal.        Judgment: Judgment normal.         Assessment & Plan:  Chronic pain of right wrist Assessment & Plan: Suspect it could be secondary to early carpel tunnel syndrome.   X-ray pending.  Discussed to continue using brace at night.  Rest.  Consider ortho referral if not better.  Orders: -     DG Wrist Complete Right  Encounter to establish care Assessment & Plan: EMR reviewed briefly.    Elevated serum creatinine Assessment & Plan: Unclear etiology.  Reviewed labs with patient.   CMP, lipid panel and A1c pending.  Await results.  Orders: -     Comprehensive metabolic panel with GFR; Future -     Lipid panel -     Hemoglobin A1c  HIV disease (HCC) Assessment & Plan: Controlled.  Followed by infectious disease.  Reviewed notes and labs from last visit.    Hyperlipidemia, unspecified hyperlipidemia type Assessment & Plan: Lipid panel pending.  Does not take Atorvastatin  as prescribed.  Discussed the importance of medication adherence.    Postsurgical hypothyroidism Assessment & Plan: Followed by endocrinologist.  Continue  Levothyroxine  125 mcg as prescribed. Reviewed labs in care everywhere.   Alopecia     Return in about 2 months (around 01/15/2024).   Jolanda Nation, NP

## 2023-11-15 NOTE — Assessment & Plan Note (Signed)
 EMR reviewed briefly.

## 2023-11-15 NOTE — Assessment & Plan Note (Signed)
 Followed by endocrinologist.  Continue Levothyroxine  125 mcg as prescribed. Reviewed labs in care everywhere.

## 2023-11-15 NOTE — Patient Instructions (Addendum)
 Stop by the lab prior to leaving today. I will notify you of your results once received.  Complete xray(s) prior to leaving today. I will notify you of your results once received.  Start monitoring your blood pressure daily, around the same time of day, for the next 2-3 weeks.  Ensure that you have rested for 30 minutes prior to checking your blood pressure.   Record your readings and notify me if you see numbers consistently at or above 130 on top and/or 90 on bottom.   Send me BP readings from home within one week.   F/u in 2 months.   It was a pleasure to see you today!

## 2023-11-16 ENCOUNTER — Ambulatory Visit: Payer: Self-pay | Admitting: General Practice

## 2023-11-16 LAB — COMPREHENSIVE METABOLIC PANEL WITH GFR
AG Ratio: 1.6 (calc) (ref 1.0–2.5)
ALT: 22 U/L (ref 9–46)
AST: 20 U/L (ref 10–40)
Albumin: 4.1 g/dL (ref 3.6–5.1)
Alkaline phosphatase (APISO): 71 U/L (ref 36–130)
BUN/Creatinine Ratio: 8 (calc) (ref 6–22)
BUN: 11 mg/dL (ref 7–25)
CO2: 28 mmol/L (ref 20–32)
Calcium: 9.1 mg/dL (ref 8.6–10.3)
Chloride: 106 mmol/L (ref 98–110)
Creat: 1.38 mg/dL — ABNORMAL HIGH (ref 0.60–1.29)
Globulin: 2.6 g/dL (ref 1.9–3.7)
Glucose, Bld: 91 mg/dL (ref 65–99)
Potassium: 4.1 mmol/L (ref 3.5–5.3)
Sodium: 141 mmol/L (ref 135–146)
Total Bilirubin: 0.4 mg/dL (ref 0.2–1.2)
Total Protein: 6.7 g/dL (ref 6.1–8.1)
eGFR: 64 mL/min/{1.73_m2} (ref >=60)

## 2023-11-16 LAB — LIPID PANEL
Cholesterol: 189 mg/dL (ref <200)
HDL: 56 mg/dL (ref >=40)
LDL Cholesterol (Calc): 116 mg/dL — ABNORMAL HIGH
Non-HDL Cholesterol (Calc): 133 mg/dL — ABNORMAL HIGH (ref <130)
Total CHOL/HDL Ratio: 3.4 (calc) (ref <5.0)
Triglycerides: 75 mg/dL (ref <150)

## 2023-11-16 LAB — HEMOGLOBIN A1C
Hgb A1c MFr Bld: 5.5 % (ref <5.7)
Mean Plasma Glucose: 111 mg/dL
eAG (mmol/L): 6.2 mmol/L

## 2023-12-03 ENCOUNTER — Other Ambulatory Visit: Payer: Self-pay | Admitting: "Endocrinology

## 2023-12-03 DIAGNOSIS — E890 Postprocedural hypothyroidism: Secondary | ICD-10-CM

## 2023-12-27 ENCOUNTER — Encounter: Payer: Self-pay | Admitting: "Endocrinology

## 2023-12-27 ENCOUNTER — Encounter: Payer: Self-pay | Admitting: Infectious Disease

## 2023-12-27 ENCOUNTER — Ambulatory Visit (INDEPENDENT_AMBULATORY_CARE_PROVIDER_SITE_OTHER): Payer: BC Managed Care – PPO | Admitting: "Endocrinology

## 2023-12-27 VITALS — BP 112/82 | HR 60 | Ht 70.0 in | Wt 166.4 lb

## 2023-12-27 DIAGNOSIS — C73 Malignant neoplasm of thyroid gland: Secondary | ICD-10-CM | POA: Diagnosis not present

## 2023-12-27 DIAGNOSIS — E890 Postprocedural hypothyroidism: Secondary | ICD-10-CM

## 2023-12-27 MED ORDER — LEVOTHYROXINE SODIUM 112 MCG PO TABS: 112.0000 ug | ORAL_TABLET | Freq: Every day | ORAL | 1 refills | Status: DC

## 2023-12-27 NOTE — Progress Notes (Signed)
 12/27/2023, 2:14 PM   Endocrinology follow-up note  Subjective:    Patient ID: Kenneth Mason, male    DOB: 11-Jul-1975, PCP Vincente Shivers, NP   Past Medical History:  Diagnosis Date   Anal wart 11/21/2014   Anxiety    Benign rectal wall submucosal cyst s/p TEM partial proctectomy Jan2013     Depression    Elevated blood pressure reading 03/04/2023   Flank pain 08/16/2020   Headache in front of head    q 2 weeks. No n/v, no photophobia. 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   Rectal polyp 1999   polyp excised   Rib pain 08/16/2020   RUQ pain 08/16/2020   Screen for STD (sexually transmitted disease) 05/13/2015   Testicular mass 03/04/2023   Thyroid  nodule 04/14/2021   Vaccine counseling 03/04/2023   Past Surgical History:  Procedure Laterality Date   BIOPSY THYROID   10/10/2021   DENTAL SURGERY  06/01/2008   Wisdom Teeth    EUS  04/09/2011   Procedure: LOWER ENDOSCOPIC ULTRASOUND (EUS);  Surgeon: Toribio Cedar, MD;  Location: [facility];  Service: Endoscopy;  Laterality: N/A;   FLEXIBLE SIGMOIDOSCOPY  04/09/2011   Procedure: FLEXIBLE SIGMOIDOSCOPY;  Surgeon: Toribio Cedar, MD;  Location: [facility];  Service: Endoscopy;  Laterality: N/A;   THYROIDECTOMY Bilateral 12/31/2021   Procedure: TOTAL THYROIDECTOMY;  Surgeon: Jesus Oliphant, MD;  [facility];  Service: ENT;  Laterality: Bilateral;   TRANSANAL ENDOSCOPIC MICROSURGERY  06/12/2011   Procedure: TRANSANAL ENDOSCOPIC MICROSURGERY;  Surgeon: Elspeth KYM Schultze, MD;  Location: [facility];  Service: General;   Laterality: N/A;  partial proctectomy by TEM   Social History   Socioeconomic History   Marital status: Divorced    Spouse name: Not on file   Number of children: 2   Years of education: 14   Highest education level: Not on file  Occupational History   Occupation: Customer Service   Tobacco Use   Smoking status: Never   Smokeless tobacco: Never  Substance and Sexual Activity   Alcohol use: Not Currently    Comment: very rarely- few times a year   Drug use: No   Sexual activity: Yes    Partners: Male    Birth control/protection: Condom    Comment: given condoms  Other Topics Concern   Not on file  Social History Narrative   HSG, 2 yrs college. Married '04 - seperated (2012). !son ' 2005; 1  dtr - '08. 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  Lives with children. Wife/ mother is abroad (aug '12).   Social Drivers of Corporate investment banker Strain: Not on file  Food Insecurity: Not on file  Transportation Needs: Not on file  Physical Activity: Not on file  Stress: Not on file  Social Connections: Not on file   Family History  Problem Relation Age of Onset   Hypertension Mother    Cancer Maternal Grandfather        unsure of type    Liver cancer Paternal Grandmother    Stroke Paternal Grandmother    Heart disease Paternal Grandfather        CAD/MI-fatal   Outpatient Encounter Medications as of 12/27/2023  Medication Sig   atorvastatin  (LIPITOR) 10 MG tablet Take 1 tablet (10 mg total) by mouth daily. (Patient not taking: Reported on 11/15/2023)   Cholecalciferol 50 MCG (2000 UT) CAPS Take 2,000 Units by mouth daily with lunch. (Patient not taking: Reported on 11/15/2023)   emtricitabine -rilpivir-tenofovir  AF (ODEFSEY ) 200-25-25 MG TABS tablet TAKE ONE TABLET BY MOUTH ONCE DAILY WITH BREAKFAST.   finasteride (PROSCAR) 5 MG tablet Take 5 mg by mouth.   levothyroxine  (SYNTHROID ) 112 MCG  tablet Take 1 tablet (112 mcg total) by mouth daily before breakfast.   minoxidil (LONITEN) 2.5 MG tablet Take 2.5 mg by mouth.   [DISCONTINUED] levothyroxine  (SYNTHROID ) 125 MCG tablet TAKE 1 TABLET BY MOUTH EVERY DAY FOR 5 DAYS THEN 1 AND 1/2 TABLET FOR 2 DAYS   No facility-administered encounter medications on file as of 12/27/2023.   ALLERGIES: No Known Allergies  VACCINATION STATUS: Immunization History  Administered Date(s) Administered   HPV 9-valent 12/02/2022, 01/01/2023, 07/05/2023   Hepatitis A 12/15/2010, 05/19/2011   Hepatitis B 12/15/2010, 05/19/2011, 06/28/2012   Influenza Split 05/19/2011, 06/28/2012   Influenza,inj,Quad PF,6+ Mos 06/20/2013, 05/06/2016, 05/03/2017, 04/14/2021   Meningococcal Mcv4o 05/03/2017, 12/07/2017   PFIZER(Purple Top)SARS-COV-2 Vaccination 08/11/2019, 09/06/2019   Pfizer Covid-19 Vaccine Bivalent Booster 64yrs & up 04/14/2021   Pneumococcal Conjugate-13 12/07/2017   Pneumococcal Polysaccharide-23 12/26/2018   Td 12/15/2010   Tdap 12/15/2010    HPI Kenneth Mason is 48 y.o. male who presents today with a medical history as above. he is being seen in follow-up after he was seen in consultation for  postsurgical hypothyroidism, history of thyroid  cancer requested by Dr. Nonda Loader, MD .  He is currently on levothyroxine  125 mcg p.o. daily.  He presents with previsit thyroid  function tests consistent with slight over replacement. -See notes from previous visit. He is status post total thyroidectomy  in August 2023 after workup indicated thyroid  malignancy.  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  - Microscopic focus confined within left lobe.  - Margins negative for involvement.   He was not offered adjuvant radioactive iodine thyroid  ablation.   He denies dysphagia, shortness of breath, nor voice change.  He has   recovered  from the surgery very well.  He denies exposure to neck radiation, no family history of thyroid  dysfunction or thyroid  malignancy.  He was sent for new set of labs after his last visit.  He has no new complaints today.  Review of Systems   Objective:       12/27/2023    1:27 PM 11/15/2023   10:25 AM 11/15/2023    9:46 AM  Vitals with BMI  Height 5' 10  5' 10  Weight 166 lbs 6 oz  165 lbs  BMI 23.88  23.68  Systolic 112 124 851  Diastolic 82 82 88  Pulse 60  71    BP 112/82   Pulse 60   Ht 5' 10 (1.778 m)   Wt 166 lb  6.4 oz (75.5 kg)   BMI 23.88 kg/m   Wt Readings from Last 3 Encounters:  12/27/23 166 lb 6.4 oz (75.5 kg)  11/15/23 165 lb (74.8 kg)  07/26/23 164 lb (74.4 kg)    Physical Exam  Constitutional:  Body mass index is 23.88 kg/m.,  not in acute distress, normal state of mind Eyes: PERRLA, EOMI, no exophthalmos ENT: moist mucous membranes, post thyroidectomy surgical scar on anterior lower neck,  no gross cervical lymphadenopathy   CMP ( most recent) CMP     Component Value Date/Time   NA 141 11/15/2023 1028   NA 141 05/15/2022 0917   K 4.1 11/15/2023 1028   CL 106 11/15/2023 1028   CO2 28 11/15/2023 1028   GLUCOSE 91 11/15/2023 1028   BUN 11 11/15/2023 1028   BUN 14 05/15/2022 0917   CREATININE 1.38 (H) 11/15/2023 1028   CALCIUM  9.1 11/15/2023 1028   PROT 6.7 11/15/2023 1028   PROT 6.8 05/15/2022 0917   ALBUMIN 4.3 05/15/2022 0917   AST 20 11/15/2023 1028   ALT 22 11/15/2023 1028   ALKPHOS 93 05/15/2022 0917   BILITOT 0.4 11/15/2023 1028   BILITOT 0.4 05/15/2022 0917   GFRNONAA 67 07/11/2020 1201   GFRAA 78 07/11/2020 1201     Diabetic Labs (most recent): Lab Results  Component Value Date   HGBA1C 5.5 11/15/2023   MICROALBUR 0.7 07/26/2023   MICROALBUR 0.50 12/12/2013     Lipid Panel ( most recent) Lipid Panel     Component Value Date/Time   CHOL 189 11/15/2023 1028   TRIG 75 11/15/2023 1028   HDL 56 11/15/2023  1028   CHOLHDL 3.4 11/15/2023 1028   VLDL 10 11/02/2016 1203   LDLCALC 116 (H) 11/15/2023 1028     Recent Results (from the past 2160 hours)  Lipid Panel     Status: Abnormal   Collection Time: 11/15/23 10:28 AM  Result Value Ref Range   Cholesterol 189 <200 mg/dL   HDL 56 > OR = 40 mg/dL   Triglycerides 75 <849 mg/dL   LDL Cholesterol (Calc) 116 (H) mg/dL (calc)    Comment: Reference range: <100 . Desirable range <100 mg/dL for primary prevention;   <70 mg/dL for patients with CHD or diabetic patients  with > or = 2 CHD risk factors. SABRA LDL-C is now calculated using the Martin-Hopkins  calculation, which is a validated novel method providing  better accuracy than the Friedewald equation in the  estimation of LDL-C.  Gladis APPLETHWAITE et al. SANDREA. 7986;689(80): 2061-2068  (http://education.[facility].com/faq/FAQ164)    Total CHOL/HDL Ratio 3.4 <5.0 (calc)   Non-HDL Cholesterol (Calc) 133 (H) <130 mg/dL (calc)    Comment: For patients with diabetes plus 1 major ASCVD risk  factor, treating to a non-HDL-C goal of <100 mg/dL  (LDL-C of <29 mg/dL) is considered a therapeutic  option.   Hemoglobin A1c     Status: None   Collection Time: 11/15/23 10:28 AM  Result Value Ref Range   Hgb A1c MFr Bld 5.5 <5.7 %    Comment: For the purpose of screening for the presence of diabetes: . <5.7%       Consistent with the absence of diabetes 5.7-6.4%    Consistent with increased risk for diabetes             (prediabetes) > or =6.5%  Consistent with diabetes . This assay result is consistent with a decreased risk of diabetes. . Currently, no consensus exists regarding use  of hemoglobin A1c for diagnosis of diabetes in children. . According to American Diabetes Association (ADA) guidelines, hemoglobin A1c <7.0% represents optimal control in non-pregnant diabetic patients. Different metrics may apply to specific patient populations.  Standards of Medical Care in Diabetes(ADA). .     Mean Plasma Glucose 111 mg/dL   eAG (mmol/L) 6.2 mmol/L  Comprehensive metabolic panel with GFR     Status: Abnormal   Collection Time: 11/15/23 10:28 AM  Result Value Ref Range   Glucose, Bld 91 65 - 99 mg/dL    Comment: .            Fasting reference interval .    BUN 11 7 - 25 mg/dL   Creat 8.61 (H) 9.39 - 1.29 mg/dL   eGFR 64 > OR = 60 fO/fpw/8.26f7   BUN/Creatinine Ratio 8 6 - 22 (calc)   Sodium 141 135 - 146 mmol/L   Potassium 4.1 3.5 - 5.3 mmol/L   Chloride 106 98 - 110 mmol/L   CO2 28 20 - 32 mmol/L   Calcium  9.1 8.6 - 10.3 mg/dL   Total Protein 6.7 6.1 - 8.1 g/dL   Albumin 4.1 3.6 - 5.1 g/dL   Globulin 2.6 1.9 - 3.7 g/dL (calc)   AG Ratio 1.6 1.0 - 2.5 (calc)   Total Bilirubin 0.4 0.2 - 1.2 mg/dL   Alkaline phosphatase (APISO) 71 36 - 130 U/L   AST 20 10 - 40 U/L   ALT 22 9 - 46 U/L  TSH     Status: Abnormal   Collection Time: 12/20/23  4:23 PM  Result Value Ref Range   TSH 0.235 (L) 0.450 - 4.500 uIU/mL  T4, free     Status: Abnormal   Collection Time: 12/20/23  4:23 PM  Result Value Ref Range   Free T4 1.86 (H) 0.82 - 1.77 ng/dL  Thyroglobulin Level     Status: None (Preliminary result)   Collection Time: 12/20/23  4:23 PM  Result Value Ref Range   Thyroglobulin (TG-RIA) WILL FOLLOW   Thyroglobulin antibody     Status: None   Collection Time: 12/20/23  4:23 PM  Result Value Ref Range   Thyroglobulin Antibody <1.0 0.0 - 0.9 IU/mL    Comment: Thyroglobulin Antibody measured by Beckman Coulter Methodology It should be noted that the presence of thyroglobulin antibodies may not be pathogenic nor diagnostic, especially at very low levels. The assay manufacturer has found that four percent of individuals without evidence of thyroid  disease or autoimmunity will have positive TgAb levels up to 4 IU/mL.   Vitamin B12     Status: None   Collection Time: 12/20/23  4:23 PM  Result Value Ref Range   Vitamin B-12 775 232 - 1,245 pg/mL     Assessment & Plan:    1. Postsurgical hypothyroidism 2. Malignant neoplasm of thyroid  gland (HCC) -Patient was recently seen as a consult status post thyroidectomy.  he has  had 0.5 cm unilateral, unifocal follicular variant papillary thyroid  cancer status post total thyroidectomy and currently with postsurgical hypothyroidism.  I had a long discussion with him about approaches of therapy for thyroid  cancer including surgery, radioactive iodine thyroid  remnant ablation, and subsequent follow-up.  Considering his microscopic, unifocal, unilateral disease, he is low risk for tumor recurrence and hence may not need radioactive iodine treatment at this time.  Prior to his last visit, thyroglobulin was undetectable.   Thyroid /neck ultrasound on December 21, 2022 was negative.  His previsit labs are consistent with  slight over replacement.  I discussed and lowered his levothyroxine  to 112 mcg p.o. daily before breakfast.    - We discussed about the correct intake of his thyroid  hormone, on empty stomach at fasting, with water , separated by at least 30 minutes from breakfast and other medications,  and separated by more than 4 hours from calcium , iron, multivitamins, acid reflux medications (PPIs). -Patient is made aware of the fact that thyroid  hormone replacement is needed for life, dose to be adjusted by periodic monitoring of thyroid  function tests.   He is advised to continue Lipitor 10 mg p.o. nightly.  He would also benefit from low-dose vitamin D3 2000 units daily.  Has complaints of mild tingling on his fingertips bilaterally.  - His vitamin B12 level was normal.  - he is advised to maintain close follow up with his PCP for primary care needs.    I spent  22  minutes in the care of the patient today including review of labs from Thyroid  Function, CMP, and other relevant labs ; imaging/biopsy records (current and previous including abstractions from other facilities); face-to-face time discussing  his lab results  and symptoms, medications doses, his options of short and long term treatment based on the latest standards of care / guidelines;   and documenting the encounter.  Fermin Eagles  participated in the discussions, expressed understanding, and voiced agreement with the above plans.  All questions were answered to his satisfaction. he is encouraged to contact clinic should he have any questions or concerns prior to his return visit.   Follow up plan: Return in about 4 months (around 04/28/2024) for F/U with Pre-visit Labs.   Ranny Earl, MD [facility] [address] [place], [place] [address] Phone: [phone]  Fax: [phone]     12/27/2023, 2:14 PM  This note was partially dictated with voice recognition software. Similar sounding words can be transcribed inadequately or may not  be corrected upon review.

## 2023-12-28 ENCOUNTER — Telehealth: Payer: Self-pay | Admitting: "Endocrinology

## 2023-12-28 DIAGNOSIS — E890 Postprocedural hypothyroidism: Secondary | ICD-10-CM

## 2023-12-28 MED ORDER — LEVOTHYROXINE SODIUM 112 MCG PO TABS: 112.0000 ug | ORAL_TABLET | Freq: Every day | ORAL | 1 refills | Status: DC

## 2023-12-28 NOTE — Telephone Encounter (Signed)
 Can you resend RX for levothyroxine  to [facility] on [address].  Dr. Nida put it for fill later

## 2023-12-28 NOTE — Telephone Encounter (Signed)
 Rx for Levothyroxine  112mcg po daily sent to [facility] on [address] in [place].

## 2023-12-29 LAB — THYROGLOBULIN ANTIBODY: Thyroglobulin Antibody: <1 [IU]/mL (ref 0.0–0.9)

## 2023-12-29 LAB — T4, FREE: Free T4: 1.86 ng/dL — ABNORMAL HIGH (ref 0.82–1.77)

## 2023-12-29 LAB — VITAMIN B12: Vitamin B-12: 775 pg/mL (ref 232–1245)

## 2023-12-29 LAB — THYROGLOBULIN LEVEL: Thyroglobulin (TG-RIA): <2 ng/mL

## 2023-12-29 LAB — TSH: TSH: 0.235 u[IU]/mL — ABNORMAL LOW (ref 0.450–4.500)

## 2024-01-17 ENCOUNTER — Ambulatory Visit: Admitting: General Practice

## 2024-01-18 ENCOUNTER — Other Ambulatory Visit: Payer: Self-pay

## 2024-01-18 ENCOUNTER — Other Ambulatory Visit

## 2024-01-18 ENCOUNTER — Other Ambulatory Visit (HOSPITAL_COMMUNITY)
Admission: RE | Admit: 2024-01-18 | Discharge: 2024-01-18 | Disposition: A | Discharge status: Home / Self Care | Source: Ambulatory Visit | Attending: Infectious Disease | Admitting: Infectious Disease

## 2024-01-18 DIAGNOSIS — B20 Human immunodeficiency virus [HIV] disease: Secondary | ICD-10-CM

## 2024-01-18 DIAGNOSIS — E162 Hypoglycemia, unspecified: Secondary | ICD-10-CM | POA: Insufficient documentation

## 2024-01-19 LAB — URINE CYTOLOGY ANCILLARY ONLY
Chlamydia: NEGATIVE
Comment: NEGATIVE
Comment: NORMAL
Neisseria Gonorrhea: NEGATIVE

## 2024-01-20 LAB — HIV-1 RNA QUANT-NO REFLEX-BLD
HIV 1 RNA Quant: NOT DETECTED {copies}/mL
HIV-1 RNA Quant, Log: NOT DETECTED {Log_copies}/mL

## 2024-01-20 LAB — CBC WITH DIFFERENTIAL/PLATELET
Absolute Lymphocytes: 3194 {cells}/uL (ref 850–3900)
Absolute Monocytes: 410 {cells}/uL (ref 200–950)
Basophils Absolute: 19 {cells}/uL (ref 0–200)
Basophils Relative: 0.3 %
Eosinophils Absolute: 70 {cells}/uL (ref 15–500)
Eosinophils Relative: 1.1 %
HCT: 48.2 % (ref 38.5–50.0)
Hemoglobin: 16 g/dL (ref 13.2–17.1)
MCH: 29 pg (ref 27.0–33.0)
MCHC: 33.2 g/dL (ref 32.0–36.0)
MCV: 87.3 fL (ref 80.0–100.0)
MPV: 9.4 fL (ref 7.5–12.5)
Monocytes Relative: 6.4 %
Neutro Abs: 2707 {cells}/uL (ref 1500–7800)
Neutrophils Relative %: 42.3 %
Platelets: 236 Thousand/uL (ref 140–400)
RBC: 5.52 Million/uL (ref 4.20–5.80)
RDW: 14.5 % (ref 11.0–15.0)
Total Lymphocyte: 49.9 %
WBC: 6.4 Thousand/uL (ref 3.8–10.8)

## 2024-01-20 LAB — COMPLETE METABOLIC PANEL WITHOUT GFR
AG Ratio: 1.7 (calc) (ref 1.0–2.5)
ALT: 17 U/L (ref 9–46)
AST: 20 U/L (ref 10–40)
Albumin: 4.8 g/dL (ref 3.6–5.1)
Alkaline phosphatase (APISO): 72 U/L (ref 36–130)
BUN: 11 mg/dL (ref 7–25)
CO2: 31 mmol/L (ref 20–32)
Calcium: 9.6 mg/dL (ref 8.6–10.3)
Chloride: 100 mmol/L (ref 98–110)
Creat: 1.23 mg/dL (ref 0.60–1.29)
Globulin: 2.8 g/dL (ref 1.9–3.7)
Glucose, Bld: 81 mg/dL (ref 65–99)
Potassium: 4.3 mmol/L (ref 3.5–5.3)
Sodium: 139 mmol/L (ref 135–146)
Total Bilirubin: 0.5 mg/dL (ref 0.2–1.2)
Total Protein: 7.6 g/dL (ref 6.1–8.1)

## 2024-01-20 LAB — LIPID PANEL
Cholesterol: 141 mg/dL (ref <200)
HDL: 58 mg/dL (ref >=40)
LDL Cholesterol (Calc): 67 mg/dL
Non-HDL Cholesterol (Calc): 83 mg/dL (ref <130)
Total CHOL/HDL Ratio: 2.4 (calc) (ref <5.0)
Triglycerides: 77 mg/dL (ref <150)

## 2024-01-20 LAB — MICROALBUMIN / CREATININE URINE RATIO
Creatinine, Urine: 50 mg/dL (ref 20–320)
Microalb, Ur: <0.2 mg/dL

## 2024-01-20 LAB — HEPATITIS C ANTIBODY: Hepatitis C Ab: NONREACTIVE

## 2024-01-20 LAB — RPR: RPR Ser Ql: NONREACTIVE

## 2024-01-24 ENCOUNTER — Other Ambulatory Visit (HOSPITAL_COMMUNITY)
Admission: RE | Admit: 2024-01-24 | Discharge: 2024-01-24 | Disposition: A | Discharge status: Home / Self Care | Source: Ambulatory Visit | Attending: Infectious Disease | Admitting: Infectious Disease

## 2024-01-24 ENCOUNTER — Telehealth: Payer: Self-pay

## 2024-01-24 ENCOUNTER — Other Ambulatory Visit: Payer: Self-pay

## 2024-01-24 ENCOUNTER — Ambulatory Visit (INDEPENDENT_AMBULATORY_CARE_PROVIDER_SITE_OTHER): Admitting: Infectious Disease

## 2024-01-24 ENCOUNTER — Other Ambulatory Visit (HOSPITAL_COMMUNITY): Payer: Self-pay

## 2024-01-24 VITALS — Wt 163.0 lb

## 2024-01-24 DIAGNOSIS — Z1212 Encounter for screening for malignant neoplasm of rectum: Secondary | ICD-10-CM | POA: Insufficient documentation

## 2024-01-24 DIAGNOSIS — R85610 Atypical squamous cells of undetermined significance on cytologic smear of anus (ASC-US): Secondary | ICD-10-CM | POA: Insufficient documentation

## 2024-01-24 DIAGNOSIS — Z7185 Encounter for immunization safety counseling: Secondary | ICD-10-CM

## 2024-01-24 DIAGNOSIS — Z21 Asymptomatic human immunodeficiency virus [HIV] infection status: Secondary | ICD-10-CM | POA: Insufficient documentation

## 2024-01-24 DIAGNOSIS — B20 Human immunodeficiency virus [HIV] disease: Secondary | ICD-10-CM | POA: Insufficient documentation

## 2024-01-24 DIAGNOSIS — E039 Hypothyroidism, unspecified: Secondary | ICD-10-CM | POA: Insufficient documentation

## 2024-01-24 DIAGNOSIS — E785 Hyperlipidemia, unspecified: Secondary | ICD-10-CM

## 2024-01-24 MED ORDER — ODEFSEY 200-25-25 MG PO TABS
ORAL_TABLET | ORAL | 11 refills | Status: DC
Start: 2024-01-24 — End: 2024-01-24

## 2024-01-24 MED ORDER — DOVATO 50-300 MG PO TABS: 1.0000 | ORAL_TABLET | Freq: Every day | ORAL | 11 refills | Status: AC

## 2024-01-24 NOTE — Telephone Encounter (Signed)
 Pharmacy Patient Advocate Encounter  Insurance verification completed.   The patient is insured through [facility]   Ran test claim for Dovato . Currently a quantity of 30 is a 30 day supply and the co-pay is $0.00 . Script will have to be filled at [facility]  This test claim was processed through [facility]- copay amounts may vary at other pharmacies due to pharmacy/plan contracts, or as the patient moves through the different stages of their insurance plan.

## 2024-01-24 NOTE — Progress Notes (Signed)
 Chief complaint: follow-up for HIV disease on medications  Subjective:    Patient ID: Kenneth Mason, male    DOB: 08-Oct-1975, 48 y.o.   MRN: [id number]  HPI  Discussed the use of AI scribe software for clinical note transcription with the patient, who gave verbal consent to proceed.  History of Present Illness   Kenneth Mason is a 48 year old male with HIV who presents for medication management and follow-up.  He is currently on Odefsey  for HIV, which he takes with a meal. He has been taking only half of his prescribed dose for the past ten years. The reason for this is he did not like the idea of taking too many medicines.      He is concerned about his CD4 count, noting a perceived downward trend over the years, but acknowledges that his CD4 count was normal at the last check in February with a count of 641.  He manages a thyroid  disorder with levothyroxine . He has experienced fluctuations in his thyroid  levels, previously being on 125 mcg of medication which made him feel good, but was adjusted to 112 mcg due to levels being too high. He takes his thyroid  medication on an empty stomach, typically in the morning, and waits at least half an hour before eating.  His metabolic panel is normal, and he notes a previous concern about elevated creatinine levels, which have now normalized. He has reduced his meat intake to once a day, which he believes has contributed to improved cholesterol levels. He is on atorvastatin  for cholesterol management.  He recalls a previous anal Pap smear in 2023 that showed abnormal cells, but a subsequent anoscopy did not reveal any issues. He did not follow up with the provider due to a poor fit and is considering repeating the test.      Past Medical History:  Diagnosis Date   Anal wart 11/21/2014   Anxiety    Benign rectal wall submucosal cyst s/p TEM partial proctectomy [id number]     Depression    Elevated blood pressure reading 03/04/2023   Flank pain  08/16/2020   Headache in front of head    q 2 weeks. No n/v, no photophobia. 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   Rectal polyp 1999   polyp excised   Rib pain 08/16/2020   RUQ pain 08/16/2020   Screen for STD (sexually transmitted disease) 05/13/2015   Testicular mass 03/04/2023   Thyroid  nodule 04/14/2021   Vaccine counseling 03/04/2023    Past Surgical History:  Procedure Laterality Date   BIOPSY THYROID   10/10/2021   DENTAL SURGERY  06/01/2008   Wisdom Teeth    EUS  04/09/2011   Procedure: LOWER ENDOSCOPIC ULTRASOUND (EUS);  Surgeon: Toribio Cedar, MD;  Location: [facility];  Service: Endoscopy;  Laterality: N/A;   FLEXIBLE SIGMOIDOSCOPY  04/09/2011   Procedure: FLEXIBLE SIGMOIDOSCOPY;  Surgeon: Toribio Cedar, MD;  Location: [facility];  Service: Endoscopy;  Laterality: N/A;   THYROIDECTOMY Bilateral 12/31/2021   Procedure: TOTAL THYROIDECTOMY;  Surgeon: Jesus Oliphant, MD;  Location[facility];  Service: ENT;  Laterality: Bilateral;   TRANSANAL ENDOSCOPIC MICROSURGERY  06/12/2011   Procedure: TRANSANAL ENDOSCOPIC MICROSURGERY;  Surgeon: Elspeth KYM Schultze, MD;  Location: [facility];  Service: General;  Laterality: N/A;  partial proctectomy by TEM    Family History  Problem Relation Age of Onset   Hypertension Mother    Cancer Maternal Grandfather        unsure of type    Liver cancer Paternal Grandmother    Stroke Paternal Grandmother    Heart disease Paternal Grandfather        CAD/MI-fatal      Social History   Socioeconomic History   Marital status: Divorced    Spouse name: Not on file   Number of  children: 2   Years of education: 14   Highest education level: Not on file  Occupational History   Occupation: Clinical biochemist   Tobacco Use   Smoking status: Never   Smokeless tobacco: Never  Substance and Sexual Activity   Alcohol use: Not Currently    Comment: very rarely- few times a year   Drug use: No   Sexual activity: Yes    Partners: Male    Birth control/protection: Condom    Comment: given condoms  Other Topics Concern   Not on file  Social History Narrative   HSG, 2 yrs college. Married '04 - seperated (2012). !son ' 2005; 1  dtr - '08. 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  Lives with children. Wife/ mother is abroad (aug '12).   Social Drivers of Corporate investment banker Strain: Not on file  Food Insecurity: Not on file  Transportation Needs: Not on file  Physical Activity: Not on file  Stress: Not on file  Social Connections: Not on file    No Known Allergies   Current Outpatient Medications:    atorvastatin  (LIPITOR) 10 MG tablet, Take 1 tablet (10 mg total) by mouth daily. (Patient not taking: Reported on 11/15/2023), Disp: 90 tablet, Rfl: 1   Cholecalciferol 50 MCG (2000 UT) CAPS, Take 2,000 Units by mouth daily with lunch. (Patient not taking: Reported on 11/15/2023), Disp: , Rfl:    emtricitabine -rilpivir-tenofovir  AF (ODEFSEY ) 200-25-25 MG TABS tablet, TAKE ONE TABLET BY MOUTH ONCE DAILY WITH BREAKFAST., Disp: 30 tablet, Rfl: 11   finasteride (PROSCAR) 5 MG tablet, Take 5 mg by mouth., Disp: , Rfl:    levothyroxine  (SYNTHROID ) 112 MCG tablet, Take 1 tablet (112 mcg total) by mouth daily before breakfast., Disp: 90 tablet, Rfl: 1   minoxidil (LONITEN) 2.5 MG tablet, Take 2.5 mg by mouth., Disp: , Rfl:    Review of Systems  Constitutional:  Negative for activity change, appetite change, chills, diaphoresis, fatigue, fever and unexpected weight change.  HENT:   Negative for congestion, rhinorrhea, sinus pressure, sneezing, sore throat and trouble swallowing.   Eyes:  Negative for photophobia and visual disturbance.  Respiratory:  Negative for cough, chest tightness, shortness of breath, wheezing and stridor.   Cardiovascular:  Negative for chest pain, palpitations and leg swelling.  Gastrointestinal:  Negative for abdominal distention, abdominal pain, anal bleeding, blood in stool, constipation, diarrhea, nausea and vomiting.  Genitourinary:  Negative for difficulty urinating, dysuria, flank pain and hematuria.  Musculoskeletal:  Negative for arthralgias, back pain, gait problem, joint swelling and myalgias.  Skin:  Negative for color change, pallor, rash and wound.  Neurological:  Negative for dizziness, tremors, weakness and light-headedness.  Hematological:  Negative for adenopathy. Does not bruise/bleed easily.  Psychiatric/Behavioral:  Negative for agitation, behavioral problems, confusion, decreased concentration, dysphoric mood and sleep disturbance.        Objective:   Physical Exam Constitutional:      Appearance: He is well-developed.  HENT:     Head: Normocephalic and atraumatic.  Eyes:     Conjunctiva/sclera: Conjunctivae normal.  Cardiovascular:     Rate and Rhythm: Normal rate and regular rhythm.  Pulmonary:     Effort: Pulmonary effort is normal. No respiratory distress.     Breath sounds: No wheezing.  Abdominal:     General: There is no distension.     Palpations: Abdomen is soft.  Musculoskeletal:        General: No tenderness. Normal range of motion.     Cervical back: Normal range of motion and neck supple.  Skin:    General: Skin is warm and dry.     Coloration: Skin is not pale.     Findings: No erythema or rash.  Neurological:     General: No focal deficit present.     Mental Status: He is alert and oriented to person, place, and time.  Psychiatric:        Mood and Affect: Mood normal.        Behavior:  Behavior normal.        Thought Content: Thought content normal.        Judgment: Judgment normal.           Assessment & Plan:   Assessment and Plan    Human immunodeficiency virus (HIV) infection HIV well controlled with undetectable viral load. DESPITE not taking Odefsey  correctly   Fortunately he has gotten away with this but it is not a good idea to continue taking a subtherapeutic dose of a drug that has a low barrier to resistance   I offered and he accepted switch to Dovato  which has a high barrier to resistance, high tolerability and has ONE THIRD less medicine   - Switch to Dovato  for HIV management. - Ensure Dovato  prescription is sent to [facility]. --check HIV RNA, CD4      Rectal cancer screening:  - Perform anal Pap smear to monitor for dysplasia. - Consider referral [facility] Monita at [facility] if anal Pap smear is abnormal.  Hypothyroidism Thyroid  medication adjusted from 125 mcg to 112 mcg due to high levels. Consistent empty stomach intake appropriate for absorption.  Hyperlipidemia Hyperlipidemia well managed with atorvastatin . Lipid levels good, atorvastatin  provides cardiovascular protection, crucial due to HIV-related heart disease risk.      Note we will recheck his HIV viral load in one month after switch to Dovato 

## 2024-01-26 ENCOUNTER — Ambulatory Visit: Payer: BC Managed Care – PPO | Admitting: Infectious Disease

## 2024-01-26 LAB — CYTOLOGY, (ORAL, ANAL, URETHRAL) ANCILLARY ONLY
Chlamydia: NEGATIVE
Chlamydia: NEGATIVE
Comment: NEGATIVE
Comment: NEGATIVE
Comment: NORMAL
Comment: NORMAL
Neisseria Gonorrhea: NEGATIVE
Neisseria Gonorrhea: NEGATIVE

## 2024-01-27 ENCOUNTER — Other Ambulatory Visit: Payer: Self-pay | Admitting: Pharmacist

## 2024-01-27 DIAGNOSIS — B20 Human immunodeficiency virus [HIV] disease: Secondary | ICD-10-CM

## 2024-01-27 LAB — CYTOLOGY - PAP: Diagnosis: NEGATIVE

## 2024-01-27 MED ORDER — DOVATO 50-300 MG PO TABS: 1.0000 | ORAL_TABLET | Freq: Every day | ORAL | Status: AC

## 2024-01-27 NOTE — Progress Notes (Signed)
 Medication Samples have been provided to the patient.  Drug name: Dovato         Strength: 50/300 mg         Qty: 14  Tablets (1 bottles) LOT: 667G   Exp.Date: 9/26  Samples requested by Dr. Fleeta Rothman.  Dosing instructions: Take one tablet by mouth once daily  The patient has been instructed regarding the correct time, dose, and frequency of taking this medication, including desired effects and most common side effects.   Alan Geralds, PharmD, CPP, BCIDP, AAHIVP Clinical Pharmacist Practitioner Infectious Diseases Clinical Pharmacist [facility]

## 2024-02-23 ENCOUNTER — Ambulatory Visit: Admitting: Infectious Disease

## 2024-03-27 ENCOUNTER — Ambulatory Visit: Admitting: General Practice

## 2024-04-07 ENCOUNTER — Other Ambulatory Visit: Payer: Self-pay

## 2024-04-07 DIAGNOSIS — B20 Human immunodeficiency virus [HIV] disease: Secondary | ICD-10-CM

## 2024-04-12 ENCOUNTER — Other Ambulatory Visit

## 2024-04-12 ENCOUNTER — Other Ambulatory Visit: Payer: Self-pay

## 2024-04-12 DIAGNOSIS — B20 Human immunodeficiency virus [HIV] disease: Secondary | ICD-10-CM

## 2024-04-13 LAB — T-HELPER CELL (CD4) - (RCID CLINIC ONLY)
CD4 % Helper T Cell: 24 % — ABNORMAL LOW (ref 33–65)
CD4 T Cell Abs: 624 /uL (ref 400–1790)

## 2024-04-14 LAB — HIV-1 RNA QUANT-NO REFLEX-BLD
HIV 1 RNA Quant: NOT DETECTED {copies}/mL
HIV-1 RNA Quant, Log: NOT DETECTED {Log_copies}/mL

## 2024-04-18 ENCOUNTER — Encounter: Payer: Self-pay | Admitting: Infectious Disease

## 2024-04-22 ENCOUNTER — Other Ambulatory Visit: Payer: Self-pay | Admitting: Infectious Disease

## 2024-04-22 DIAGNOSIS — E785 Hyperlipidemia, unspecified: Secondary | ICD-10-CM

## 2024-04-22 DIAGNOSIS — B20 Human immunodeficiency virus [HIV] disease: Secondary | ICD-10-CM

## 2024-04-24 MED ORDER — ATORVASTATIN CALCIUM 10 MG PO TABS: 10.0000 mg | ORAL_TABLET | Freq: Every day | ORAL | 1 refills | Status: AC

## 2024-04-24 NOTE — Addendum Note (Signed)
 Addended by: FLORENE BOUCHARD D on: 04/24/2024 09:14 AM   Modules accepted: Orders

## 2024-04-26 ENCOUNTER — Ambulatory Visit: Admitting: Infectious Disease

## 2024-05-02 ENCOUNTER — Ambulatory Visit (INDEPENDENT_AMBULATORY_CARE_PROVIDER_SITE_OTHER): Admitting: "Endocrinology

## 2024-05-02 ENCOUNTER — Encounter: Payer: Self-pay | Admitting: "Endocrinology

## 2024-05-02 VITALS — BP 128/82 | HR 60 | Ht 70.0 in | Wt 165.0 lb

## 2024-05-02 DIAGNOSIS — E559 Vitamin D deficiency, unspecified: Secondary | ICD-10-CM

## 2024-05-02 DIAGNOSIS — E890 Postprocedural hypothyroidism: Secondary | ICD-10-CM

## 2024-05-02 DIAGNOSIS — C73 Malignant neoplasm of thyroid gland: Secondary | ICD-10-CM | POA: Diagnosis not present

## 2024-05-02 MED ORDER — LEVOTHYROXINE SODIUM 112 MCG PO TABS: 112.0000 ug | ORAL_TABLET | Freq: Every day | ORAL | 1 refills | Status: AC

## 2024-05-02 NOTE — Progress Notes (Signed)
 05/02/2024, 4:12 PM   Endocrinology follow-up note  Subjective:    Patient ID: Kenneth Mason, male    DOB: May 31, 1976, PCP Vincente Shivers, NP   Past Medical History:  Diagnosis Date   Anal wart 11/21/2014   Anxiety    Benign rectal wall submucosal cyst s/p TEM partial proctectomy Jan2013     Depression    Elevated blood pressure reading 03/04/2023   Flank pain 08/16/2020   Headache in front of head    q 2 weeks. No n/v, no photophobia. 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   Rectal polyp 1999   polyp excised   Rib pain 08/16/2020   RUQ pain 08/16/2020   Screen for STD (sexually transmitted disease) 05/13/2015   Testicular mass 03/04/2023   Thyroid  nodule 04/14/2021   Vaccine counseling 03/04/2023   Past Surgical History:  Procedure Laterality Date   BIOPSY THYROID   10/10/2021   DENTAL SURGERY  06/01/2008   Wisdom Teeth    EUS  04/09/2011   Procedure: LOWER ENDOSCOPIC ULTRASOUND (EUS);  Surgeon: Toribio Cedar, MD;  Location: [facility];  Service: Endoscopy;  Laterality: N/A;   FLEXIBLE SIGMOIDOSCOPY  04/09/2011   Procedure: FLEXIBLE SIGMOIDOSCOPY;  Surgeon: Toribio Cedar, MD;  Location: [facility];  Service: Endoscopy;  Laterality: N/A;   THYROIDECTOMY Bilateral 12/31/2021   Procedure: TOTAL THYROIDECTOMY;  Surgeon: Jesus Oliphant, MD;  Location: [facility] OR;  Service: ENT;  Laterality: Bilateral;   TRANSANAL ENDOSCOPIC MICROSURGERY  06/12/2011   Procedure: TRANSANAL ENDOSCOPIC MICROSURGERY;  Surgeon: Elspeth KYM Schultze, MD;  Location: [facility];  Service: General;   Laterality: N/A;  partial proctectomy by TEM   Social History   Socioeconomic History   Marital status: Divorced    Spouse name: Not on file   Number of children: 2   Years of education: 14   Highest education level: Not on file  Occupational History   Occupation: Customer Service   Tobacco Use   Smoking status: Never   Smokeless tobacco: Never  Substance and Sexual Activity   Alcohol use: Not Currently    Comment: very rarely- few times a year   Drug use: No   Sexual activity: Yes    Partners: Male    Birth control/protection: Condom    Comment: given condoms  Other Topics Concern   Not on file  Social History Narrative   HSG, 2 yrs college. Married '04 - seperated (2012). !son ' 2005; 1  dtr - '08. Work - customer service/[employer]. Passion is music - has worked as a tree surgeon: recording and live dates.  Lives with children. Wife/ mother is abroad (aug '12).   Social Drivers of Corporate Investment Banker Strain: Not on file  Food Insecurity: Not on file  Transportation Needs: Not on file  Physical Activity: Not on file  Stress: Not on file  Social Connections: Not on file   Family History  Problem Relation Age of Onset   Hypertension Mother    Cancer Maternal Grandfather        unsure of type    Liver cancer Paternal Grandmother    Stroke Paternal Grandmother    Heart disease Paternal Grandfather        CAD/MI-fatal   Outpatient Encounter Medications as of 05/02/2024  Medication Sig   atorvastatin  (LIPITOR) 10 MG tablet Take 1 tablet (10 mg total) by mouth daily.   Cholecalciferol 50 MCG (2000 UT) CAPS Take 2,000 Units by mouth daily with lunch. (Patient not taking: Reported on 11/15/2023)   dolutegravir-lamiVUDine (DOVATO ) 50-300 MG tablet Take 1 tablet by mouth daily.   finasteride (PROSCAR) 5 MG tablet Take 5 mg by mouth.   levothyroxine  (SYNTHROID ) 112 MCG tablet Take 1 tablet (112 mcg total) by mouth daily before breakfast.   minoxidil  (LONITEN) 2.5 MG tablet Take 2.5 mg by mouth.   [DISCONTINUED] levothyroxine  (SYNTHROID ) 112 MCG tablet Take 1 tablet (112 mcg total) by mouth daily before breakfast.   No facility-administered encounter medications on file as of 05/02/2024.   ALLERGIES: No Known Allergies  VACCINATION STATUS: Immunization History  Administered Date(s) Administered   HPV 9-valent 12/02/2022, 01/01/2023, 07/05/2023   Hepatitis A 12/15/2010, 05/19/2011   Hepatitis B 12/15/2010, 05/19/2011, 06/28/2012   Influenza Split 05/19/2011, 06/28/2012   Influenza,inj,Quad PF,6+ Mos 06/20/2013, 05/06/2016, 05/03/2017, 04/14/2021   Meningococcal Mcv4o 05/03/2017, 12/07/2017   PFIZER(Purple Top)SARS-COV-2 Vaccination 08/11/2019, 09/06/2019   Pfizer Covid-19 Vaccine Bivalent Booster 47yrs & up 04/14/2021   Pneumococcal Conjugate-13 12/07/2017   Pneumococcal Polysaccharide-23 12/26/2018   Td 12/15/2010   Tdap 12/15/2010    HPI [name] is 48 y.o. male who presents today with a medical history as above. he is being seen in follow-up after he was seen in consultation for  postsurgical hypothyroidism, history of thyroid  cancer requested by Dr. Nonda Loader, MD .  He is currently on levothyroxine  112 mcg p.o. daily before breakfast.    He presents with previsit thyroid  function tests consistent with appropriate replacement.   -See notes from previous visit. He is status post total thyroidectomy  in August 2023 after workup indicated thyroid  malignancy.  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  - Microscopic focus confined within left lobe.  - Margins negative for involvement.   He was not offered adjuvant radioactive iodine thyroid  ablation.   He denies dysphagia, shortness of breath, nor voice change.  He has  recovered  from the surgery very well.  He denies exposure to neck  radiation, no family history of thyroid  dysfunction or thyroid  malignancy.  He was sent for new set of labs after his last visit.  He has no new complaints today.  Review of Systems   Objective:       05/02/2024    1:35 PM 01/24/2024    4:14 PM 12/27/2023    1:27 PM  Vitals with BMI  Height 5' 10  5' 10  Weight 165 lbs 163 lbs 166 lbs 6 oz  BMI 23.68  23.88  Systolic 128  112  Diastolic 82  82  Pulse 60  60    BP 128/82   Pulse 60   Ht 5' 10 (1.778 m)   Wt 165 lb (74.8 kg)   BMI 23.68 kg/m   Wt Readings  from Last 3 Encounters:  05/02/24 165 lb (74.8 kg)  01/24/24 163 lb (73.9 kg)  12/27/23 166 lb 6.4 oz (75.5 kg)    Physical Exam  Constitutional:  Body mass index is 23.68 kg/m.,  not in acute distress, normal state of mind Eyes: PERRLA, EOMI, no exophthalmos ENT: moist mucous membranes, post thyroidectomy surgical scar on anterior lower neck,  no gross cervical lymphadenopathy   CMP ( most recent) CMP     Component Value Date/Time   NA 139 01/18/2024 1513   NA 141 05/15/2022 0917   K 4.3 01/18/2024 1513   CL 100 01/18/2024 1513   CO2 31 01/18/2024 1513   GLUCOSE 81 01/18/2024 1513   BUN 11 01/18/2024 1513   BUN 14 05/15/2022 0917   CREATININE 1.23 01/18/2024 1513   CALCIUM  9.6 01/18/2024 1513   PROT 7.6 01/18/2024 1513   PROT 6.8 05/15/2022 0917   ALBUMIN 4.3 05/15/2022 0917   AST 20 01/18/2024 1513   ALT 17 01/18/2024 1513   ALKPHOS 93 05/15/2022 0917   BILITOT 0.5 01/18/2024 1513   BILITOT 0.4 05/15/2022 0917   GFRNONAA 67 07/11/2020 1201   GFRAA 78 07/11/2020 1201     Diabetic Labs (most recent): Lab Results  Component Value Date   HGBA1C 5.5 11/15/2023   MICROALBUR <0.2 01/18/2024   MICROALBUR 0.7 07/26/2023   MICROALBUR 0.50 12/12/2013     Lipid Panel ( most recent) Lipid Panel     Component Value Date/Time   CHOL 141 01/18/2024 1513   TRIG 77 01/18/2024 1513   HDL 58 01/18/2024 1513   CHOLHDL 2.4 01/18/2024 1513   VLDL 10  11/02/2016 1203   LDLCALC 67 01/18/2024 1513     Recent Results (from the past 2160 hours)  T-helper cell (CD4)- ([facility] clinic only)     Status: Abnormal   Collection Time: 04/12/24  3:44 PM  Result Value Ref Range   CD4 T Cell Abs 624 400 - 1,790 /uL   CD4 % Helper T Cell 24 (L) 33 - 65 %    Comment: Performed at [facility], [address], [place], [place] [address]  HIV-1 RNA quant-no reflex-bld     Status: None   Collection Time: 04/12/24  3:44 PM  Result Value Ref Range   HIV 1 RNA Quant NOT DETECTED NOT DETECTED copies/mL   HIV-1 RNA Quant, Log NOT DETECTED NOT DETECTED Log copies/mL    Comment: . This test was performed using Real-Time Polymerase Chain Reaction. . Reportable Range: 20 copies/mL to 10,000,000 copies/mL (1.30 log copies/mL to 7.00 log copies/mL).   TSH     Status: None   Collection Time: 04/24/24  2:16 PM  Result Value Ref Range   TSH 1.030 0.450 - 4.500 uIU/mL  T4, free     Status: None   Collection Time: 04/24/24  2:16 PM  Result Value Ref Range   Free T4 1.74 0.82 - 1.77 ng/dL  Thyroglobulin Level     Status: None (Preliminary result)   Collection Time: 04/24/24  2:16 PM  Result Value Ref Range   Thyroglobulin (TG-RIA) WILL FOLLOW   Thyroglobulin antibody     Status: None   Collection Time: 04/24/24  2:16 PM  Result Value Ref Range   Thyroglobulin Antibody <1.0 0.0 - 0.9 IU/mL    Comment: Thyroglobulin Antibody measured by [employer] Methodology It should be noted that the presence of thyroglobulin antibodies may not be pathogenic nor diagnostic, especially at very low  levels. The assay manufacturer has found that four percent of individuals without evidence of thyroid  disease or autoimmunity will have positive TgAb levels up to 4 IU/mL.      Assessment & Plan:   1. Postsurgical hypothyroidism 2. Malignant neoplasm of thyroid  gland (HCC) 3.  D deficiency -Patient was recently seen as a consult status post  thyroidectomy.  he has  had 0.5 cm unilateral, unifocal follicular variant papillary thyroid  cancer status post total thyroidectomy and currently with postsurgical hypothyroidism.  I had a long discussion with him about approaches of therapy for thyroid  cancer including surgery, radioactive iodine thyroid  remnant ablation, and subsequent follow-up.  Considering his microscopic, unifocal, unilateral disease, he is low risk for tumor recurrence and hence may not need radioactive iodine treatment at this time.  Prior to his last visit, thyroglobulin was undetectable.  Thyroid  lab levels before this visit are still in process.  Thyroid /neck ultrasound on December 21, 2022 was negative.  His previsit labs are consistent with appropriate replacement.  He is advised to continue levothyroxine  112 mcg p.o. daily before breakfast.     - We discussed about the correct intake of his thyroid  hormone, on empty stomach at fasting, with water , separated by at least 30 minutes from breakfast and other medications,  and separated by more than 4 hours from calcium , iron, multivitamins, acid reflux medications (PPIs). -Patient is made aware of the fact that thyroid  hormone replacement is needed for life, dose to be adjusted by periodic monitoring of thyroid  function tests.  He is advised to continue Lipitor 10 mg p.o. nightly.  He would also benefit from low-dose vitamin D3 2000 units daily.     - he is advised to maintain close follow up with his PCP for primary care needs.    I spent  25  minutes in the care of the patient today including review of labs from Thyroid  Function, CMP, and other relevant labs ; imaging/biopsy records (current and previous including abstractions from other facilities); face-to-face time discussing  his lab results and symptoms, medications doses, his options of short and long term treatment based on the latest standards of care / guidelines;   and documenting the encounter.  Fermin Eagles   participated in the discussions, expressed understanding, and voiced agreement with the above plans.  All questions were answered to his satisfaction. he is encouraged to contact clinic should he have any questions or concerns prior to his return visit.    Follow up plan: Return in about 6 months (around 10/31/2024) for F/U with Pre-visit Labs.   Ranny Earl, MD [facility] [address] [place], [place] [address] Phone: [phone]  Fax: [phone]     05/02/2024, 4:12 PM  This note was partially dictated with voice recognition software. Similar sounding words can be transcribed inadequately or may not  be corrected upon review.

## 2024-05-03 ENCOUNTER — Ambulatory Visit: Admitting: Internal Medicine

## 2024-05-03 ENCOUNTER — Encounter: Payer: Self-pay | Admitting: Internal Medicine

## 2024-05-03 ENCOUNTER — Other Ambulatory Visit: Payer: Self-pay

## 2024-05-03 VITALS — BP 137/87 | HR 58 | Temp 98.3°F | Ht 71.0 in | Wt 164.0 lb

## 2024-05-03 DIAGNOSIS — B20 Human immunodeficiency virus [HIV] disease: Secondary | ICD-10-CM

## 2024-05-03 DIAGNOSIS — R85610 Atypical squamous cells of undetermined significance on cytologic smear of anus (ASC-US): Secondary | ICD-10-CM

## 2024-05-03 DIAGNOSIS — Z79899 Other long term (current) drug therapy: Secondary | ICD-10-CM

## 2024-05-03 NOTE — Progress Notes (Signed)
 Patient ID: Kenneth Mason, male   DOB: 16-Feb-1976, 48 y.o.   MRN: [id number]  HPI Kenneth Mason is a 48yo F with well controlled hiv disease, CD 4 count of 624/VL<20 (nov 2025) tolerating switch to dovato . He hasn't missed any doses. He has question to why his CD 4 count has trended down over the years. He has remained undetectable for the past 88yrs.   He has question to having history of abn anal pap -- however has not had HRA yet.  Had abn pap in November 2023; but recent repeat pap is negative.  Outpatient Encounter Medications as of 05/03/2024  Medication Sig   atorvastatin  (LIPITOR) 10 MG tablet Take 1 tablet (10 mg total) by mouth daily.   Cholecalciferol 50 MCG (2000 UT) CAPS Take 2,000 Units by mouth daily with lunch.   dolutegravir-lamiVUDine (DOVATO ) 50-300 MG tablet Take 1 tablet by mouth daily.   levothyroxine  (SYNTHROID ) 112 MCG tablet Take 1 tablet (112 mcg total) by mouth daily before breakfast.   [DISCONTINUED] finasteride (PROSCAR) 5 MG tablet Take 5 mg by mouth. (Patient not taking: Reported on 05/03/2024)   [DISCONTINUED] minoxidil (LONITEN) 2.5 MG tablet Take 2.5 mg by mouth. (Patient not taking: Reported on 05/03/2024)   No facility-administered encounter medications on file as of 05/03/2024.     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   Malignant neoplasm of thyroid  gland (HCC) 10/17/2021   Hoarseness of voice 07/17/2021   Thyroid  nodule 04/14/2021   RUQ pain 08/16/2020   Rib pain 08/16/2020   Flank pain 08/16/2020    Need for prophylactic vaccination against Streptococcus pneumoniae (pneumococcus) 12/07/2017   Insomnia 12/05/2015   HIV disease (HCC) 05/13/2015   Screen for STD (sexually transmitted disease) 05/13/2015   Anal wart 11/21/2014   Rectal discharge 06/20/2013   Cyst 08/05/2011   Lymphadenopathy, neck (esp sublingual) 04/28/2011   Chronic tension headaches 03/03/2011   Blood per rectum 02/09/2011   Laryngitis, chronic 02/02/2011   Depression    HIV infection [facility])      Health Maintenance Due  Topic Date Due   DTaP/Tdap/Td (3 - Td or Tdap) 12/14/2020   Colonoscopy  Never done   Pneumococcal Vaccine (3 of 3 - PCV20 or PCV21) 12/26/2023   Influenza Vaccine  12/31/2023   COVID-19 Vaccine (4 - 2025-26 season) 01/31/2024     Review of Systems 12 point ros is otherwise negative Physical Exam   BP 137/87   Pulse (!) 58   Temp 98.3 F (36.8 C) (Temporal)   Ht 5' 11 (1.803 m)   Wt 164 lb (74.4 kg)   SpO2 99%   BMI 22.87 kg/m   Physical Exam  Constitutional: He is oriented to person, place, and time. He appears well-developed and well-nourished. No distress.  HENT:  Mouth/Throat: Oropharynx is clear and moist. No oropharyngeal exudate.  Cardiovascular: Normal rate, regular rhythm and normal heart sounds. Exam reveals no gallop and no friction rub.  No murmur heard.  Pulmonary/Chest: Effort normal and breath sounds normal. No respiratory distress. He has no  wheezes.  Abdominal: Soft. Bowel sounds are normal. He exhibits no distension. There is no tenderness.  Lymphadenopathy:  He has no cervical adenopathy.  Neurological: He is alert and oriented to person, place, and time.  Skin: Skin is warm and dry. No rash noted. No erythema.  Psychiatric: He has a normal mood and affect. His behavior is normal.   Lab Results  Component Value Date   CD4TCELL 24 (L) 04/12/2024   Lab Results  Component Value Date   CD4TABS 624 04/12/2024   CD4TABS 641 07/05/2023   CD4TABS 740  01/05/2023   Lab Results  Component Value Date   HIV1RNAQUANT NOT DETECTED 04/12/2024   No results found for: HEPBSAB Lab Results  Component Value Date   LABRPR NON-REACTIVE 01/18/2024    CBC Lab Results  Component Value Date   WBC 6.4 01/18/2024   RBC 5.52 01/18/2024   HGB 16.0 01/18/2024   HCT 48.2 01/18/2024   PLT 236 01/18/2024   MCV 87.3 01/18/2024   MCH 29.0 01/18/2024   MCHC 33.2 01/18/2024   RDW 14.5 01/18/2024   LYMPHSABS 2,750 01/05/2023   MONOABS 790 11/02/2016   EOSABS 70 01/18/2024    BMET Lab Results  Component Value Date   NA 139 01/18/2024   K 4.3 01/18/2024   CL 100 01/18/2024   CO2 31 01/18/2024   GLUCOSE 81 01/18/2024   BUN 11 01/18/2024   CREATININE 1.23 01/18/2024   CALCIUM  9.6 01/18/2024   GFRNONAA 67 07/11/2020   GFRAA 78 07/11/2020      Assessment and Plan  Hiv disease= continue on dovato ; well controlled based on recent labs.  Long term medication management = appears to be at his baseline. Cr at 1.23  Hx of abn pap = will refer to dr barroso for HRA

## 2024-05-06 LAB — THYROGLOBULIN ANTIBODY: Thyroglobulin Antibody: <1 [IU]/mL (ref 0.0–0.9)

## 2024-05-06 LAB — TSH: TSH: 1.03 u[IU]/mL (ref 0.450–4.500)

## 2024-05-06 LAB — THYROGLOBULIN LEVEL: Thyroglobulin (TG-RIA): <2 ng/mL

## 2024-05-06 LAB — T4, FREE: Free T4: 1.74 ng/dL (ref 0.82–1.77)

## 2024-05-12 ENCOUNTER — Other Ambulatory Visit: Payer: Self-pay

## 2024-06-12 ENCOUNTER — Encounter: Payer: Self-pay | Admitting: Infectious Disease

## 2024-06-12 NOTE — Telephone Encounter (Signed)
 To me, sounds like he is using their automated system through text for his refills and it isn't working. And unfortunately, it takes a week to ship from [facility] sometimes. He can come and get samples if he needs them! I may encourage him to call next month instead of doing the automated system if it keeps not working.

## 2024-06-14 ENCOUNTER — Other Ambulatory Visit: Payer: Self-pay | Admitting: Pharmacist

## 2024-06-14 DIAGNOSIS — B20 Human immunodeficiency virus [HIV] disease: Secondary | ICD-10-CM

## 2024-06-14 MED ORDER — DOVATO 50-300 MG PO TABS: 1.0000 | ORAL_TABLET | Freq: Every day | ORAL | 0 refills | Status: AC

## 2024-06-14 NOTE — Progress Notes (Signed)
 Medication Samples have been provided to the patient.  Drug name: Dovato         Strength: 50/300 mg         Qty: 1 bottle (14 tablets)   LOT: [id number]   Exp.Date: 12/29/25  Samples requested by Duwaine Lowe, RN.  Dosing instructions: Take one tablet by mouth once daily  The patient has been instructed regarding the correct time, dose, and frequency of taking this medication, including desired effects and most common side effects.   Coraline Talwar L. Ozzy Bohlken, PharmD, BCIDP, AAHIVP, CPP Clinical Pharmacist Practitioner Infectious Diseases Clinical Pharmacist [facility]

## 2024-10-31 ENCOUNTER — Ambulatory Visit: Admitting: "Endocrinology

## 2024-11-01 ENCOUNTER — Other Ambulatory Visit

## 2024-11-15 ENCOUNTER — Encounter: Payer: Self-pay | Admitting: Infectious Disease
